# Patient Record
Sex: Female | Born: 1995 | Race: Black or African American | Hispanic: No | Marital: Single | Smoking: Former smoker
Health system: Southern US, Community
[De-identification: ages and names within clinical notes are randomized; demographics above are authoritative.]

## PROBLEM LIST (undated history)

## (undated) ENCOUNTER — Inpatient Hospital Stay: Payer: Self-pay

## (undated) DIAGNOSIS — N83209 Unspecified ovarian cyst, unspecified side: Secondary | ICD-10-CM

## (undated) DIAGNOSIS — I10 Essential (primary) hypertension: Secondary | ICD-10-CM

## (undated) DIAGNOSIS — Z98891 History of uterine scar from previous surgery: Secondary | ICD-10-CM

## (undated) HISTORY — DX: History of uterine scar from previous surgery: Z98.891

## (undated) HISTORY — PX: APPENDECTOMY: SHX54

## (undated) HISTORY — PX: WISDOM TOOTH EXTRACTION: SHX21

---
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
## 2015-02-04 ENCOUNTER — Emergency Department
Admission: EM | Admit: 2015-02-04 | Discharge: 2015-02-04 | Disposition: A | Discharge status: Home / Self Care | Payer: Medicaid Other | Attending: Emergency Medicine | Admitting: Emergency Medicine

## 2015-02-04 DIAGNOSIS — M25562 Pain in left knee: Secondary | ICD-10-CM | POA: Diagnosis not present

## 2015-02-04 DIAGNOSIS — M25561 Pain in right knee: Secondary | ICD-10-CM | POA: Insufficient documentation

## 2015-02-04 MED ORDER — NAPROXEN 500 MG PO TABS: 500.0000 mg | ORAL_TABLET | Freq: Two times a day (BID) | ORAL | Status: DC

## 2015-02-04 NOTE — ED Provider Notes (Signed)
[facility] Emergency Department Provider Note  ____________________________________________  Time seen: On arrival  I have reviewed the triage vital signs and the nursing notes.   HISTORY  Chief Complaint Knee Pain    HPI Brittany Young is a 19 y.o. female whopresents with complaints to bilateral knees for about 2 weeks. She blames this on having to stand at her job all day. He is able to ambulate without difficulty. She reports the pain is intermittent and is aching and mild in nature. No injuries  History reviewed. No pertinent past medical history.  There are no active problems to display for this patient.   History reviewed. No pertinent past surgical history.  Current Outpatient Rx  Name  Route  Sig  Dispense  Refill  . naproxen (NAPROSYN) 500 MG tablet   Oral   Take 1 tablet (500 mg total) by mouth 2 (two) times daily with a meal.   20 tablet   2     Allergies Review of patient's allergies indicates no known allergies.  No family history on file.  Social History History  Substance Use Topics  . Smoking status: Never Smoker   . Smokeless tobacco: Not on file  . Alcohol Use: Not on file   no alcohol use  Review of Systems  Constitutional: Negative for fever. Eyes: Negative for visual changes. ENT: Negative for sore throat Genitourinary: Negative for dysuria. Musculoskeletal: Negative for back pain. Positive for bilateral knee discomfort Skin: Negative for rash. Neurological: Negative for headaches    ____________________________________________   PHYSICAL EXAM:  VITAL SIGNS: ED Triage Vitals  Enc Vitals Group     BP 02/04/15 1447 122/81 mmHg     Pulse Rate 02/04/15 1447 72     Resp 02/04/15 1447 14     Temp 02/04/15 1447 98.6 F (37 C)     Temp Source 02/04/15 1447 Oral     SpO2 02/04/15 1447 99 %     Weight 02/04/15 1447 134 lb (60.782 kg)     Height 02/04/15 1447 5\' 3"  (1.6 m)     Head Cir --      Peak Flow --       Pain Score 02/04/15 1448 3     Pain Loc --      Pain Edu? --      Excl. in GC? --      Constitutional: Alert and oriented. Well appearing and in no distress.     Musculoskeletal: Nontender with normal range of motion in all extremities. No tenderness to palpation of knees. No knee effusions. No erythema. No injury. Benign knee exam. All tendons intact Neurologic:  Normal speech and language. No gross focal neurologic deficits are appreciated. Skin:  Skin is warm, dry and intact. No rash noted. Psychiatric: Mood and affect are normal. Patient exhibits appropriate insight and judgment.  ____________________________________________    LABS (pertinent positives/negatives)  Labs Reviewed - No data to display  ____________________________________________     ____________________________________________    RADIOLOGY I have personally reviewed any xrays that were ordered on this patient: None  ____________________________________________   PROCEDURES  Procedure(s) performed: none   ____________________________________________   INITIAL IMPRESSION / ASSESSMENT AND PLAN / ED COURSE  Pertinent labs & imaging results that were available during my care of the patient were reviewed by me and considered in my medical decision making (see chart for details).  Benign knee exam. I'll prescribe NSAIDs and have her follow-up with him as needed.  ____________________________________________  FINAL CLINICAL IMPRESSION(S) / ED DIAGNOSES  Final diagnoses:  Knee pain, bilateral     Jene Every, MD 02/04/15 [phone]

## 2015-02-04 NOTE — Discharge Instructions (Signed)

## 2015-02-04 NOTE — ED Notes (Signed)
Pt reports to ED w/ c/o of pain in both knees.

## 2015-02-04 NOTE — ED Notes (Signed)
States pain to bilateral knees off and on for last 2 weeks. States she stands a lot for her job.

## 2015-07-22 ENCOUNTER — Encounter: Payer: Self-pay | Admitting: Emergency Medicine

## 2015-07-22 ENCOUNTER — Observation Stay
Admission: EM | Admit: 2015-07-22 | Discharge: 2015-07-24 | Disposition: A | Discharge status: Home / Self Care | Payer: Medicaid Other | Attending: General Surgery | Admitting: General Surgery

## 2015-07-22 DIAGNOSIS — Z79899 Other long term (current) drug therapy: Secondary | ICD-10-CM | POA: Diagnosis not present

## 2015-07-22 DIAGNOSIS — E876 Hypokalemia: Secondary | ICD-10-CM | POA: Diagnosis not present

## 2015-07-22 DIAGNOSIS — K353 Acute appendicitis with localized peritonitis, without perforation or gangrene: Secondary | ICD-10-CM

## 2015-07-22 DIAGNOSIS — R1031 Right lower quadrant pain: Secondary | ICD-10-CM | POA: Insufficient documentation

## 2015-07-22 DIAGNOSIS — R112 Nausea with vomiting, unspecified: Secondary | ICD-10-CM | POA: Diagnosis not present

## 2015-07-22 DIAGNOSIS — A749 Chlamydial infection, unspecified: Secondary | ICD-10-CM | POA: Diagnosis not present

## 2015-07-22 DIAGNOSIS — K2970 Gastritis, unspecified, without bleeding: Secondary | ICD-10-CM | POA: Insufficient documentation

## 2015-07-22 DIAGNOSIS — K37 Unspecified appendicitis: Secondary | ICD-10-CM | POA: Diagnosis present

## 2015-07-22 HISTORY — DX: Unspecified ovarian cyst, unspecified side: N83.209

## 2015-07-22 LAB — CBC
HEMATOCRIT: 36.1 % (ref 35.0–47.0)
HEMOGLOBIN: 11.8 g/dL — AB (ref 12.0–16.0)
MCH: 26.8 pg (ref 26.0–34.0)
MCHC: 32.5 g/dL (ref 32.0–36.0)
MCV: 82.4 fL (ref 80.0–100.0)
Platelets: 228 10*3/uL (ref 150–440)
RBC: 4.39 MIL/uL (ref 3.80–5.20)
RDW: 13 % (ref 11.5–14.5)
WBC: 13.2 10*3/uL — ABNORMAL HIGH (ref 3.6–11.0)

## 2015-07-22 LAB — COMPREHENSIVE METABOLIC PANEL
ALK PHOS: 61 U/L (ref 38–126)
ALT: 11 U/L — AB (ref 14–54)
AST: 16 U/L (ref 15–41)
Albumin: 4.7 g/dL (ref 3.5–5.0)
Anion gap: 10 (ref 5–15)
BILIRUBIN TOTAL: 1.2 mg/dL (ref 0.3–1.2)
BUN: 9 mg/dL (ref 6–20)
CHLORIDE: 106 mmol/L (ref 101–111)
CO2: 23 mmol/L (ref 22–32)
Calcium: 9.6 mg/dL (ref 8.9–10.3)
Creatinine, Ser: 0.63 mg/dL (ref 0.44–1.00)
GFR calc Af Amer: >60 mL/min (ref >60)
Glucose, Bld: 117 mg/dL — ABNORMAL HIGH (ref 65–99)
Potassium: 2.8 mmol/L — CL (ref 3.5–5.1)
Sodium: 139 mmol/L (ref 135–145)
TOTAL PROTEIN: 8.4 g/dL — AB (ref 6.5–8.1)

## 2015-07-22 LAB — LIPASE, BLOOD: LIPASE: 19 U/L (ref 11–51)

## 2015-07-22 NOTE — ED Notes (Signed)
Pt to triage via w/c, tearful; reports N/V and abd pain "all over" tonight while at work

## 2015-07-22 NOTE — ED Notes (Signed)
Pt presents to ED with c/o nausea, vomiting and abdominal pain since about 1900-2000 tonight while at work. Pt reports sharp pain all over stomach, denies diarrhea or blood in emesis. Pt reports noticed vaginal blood spotting that began tonight. Reports LMP was last week. Pt denies chest pain, dizziness, or fevers. Pt alert and oriented x 4, skin warm and dry, no increased work in breathing. NAD noted at this time.

## 2015-07-22 NOTE — ED Notes (Signed)
Pt reports unable to provide urine specimen at this time. Informed pt to notify this RN when able to provide specimen.

## 2015-07-22 NOTE — ED Notes (Signed)
Pt unable to urinate. Given specimen cup.

## 2015-07-23 ENCOUNTER — Observation Stay: Payer: Medicaid Other | Admitting: Anesthesiology

## 2015-07-23 ENCOUNTER — Encounter
Admission: EM | Disposition: A | Discharge status: Home / Self Care | Payer: Self-pay | Source: Home / Self Care | Attending: Emergency Medicine

## 2015-07-23 ENCOUNTER — Emergency Department: Payer: Medicaid Other

## 2015-07-23 DIAGNOSIS — K353 Acute appendicitis with localized peritonitis, without perforation or gangrene: Secondary | ICD-10-CM | POA: Insufficient documentation

## 2015-07-23 DIAGNOSIS — K37 Unspecified appendicitis: Secondary | ICD-10-CM | POA: Diagnosis present

## 2015-07-23 HISTORY — PX: LAPAROSCOPIC APPENDECTOMY: SHX408

## 2015-07-23 LAB — URINALYSIS COMPLETE WITH MICROSCOPIC (ARMC ONLY)
Bilirubin Urine: NEGATIVE
GLUCOSE, UA: 50 mg/dL — AB
Leukocytes, UA: NEGATIVE
NITRITE: POSITIVE — AB
Protein, ur: 100 mg/dL — AB
Specific Gravity, Urine: 1.024 (ref 1.005–1.030)
pH: 7 (ref 5.0–8.0)

## 2015-07-23 LAB — BASIC METABOLIC PANEL
ANION GAP: 6 (ref 5–15)
BUN: 6 mg/dL (ref 6–20)
CALCIUM: 8.8 mg/dL — AB (ref 8.9–10.3)
CHLORIDE: 106 mmol/L (ref 101–111)
CO2: 24 mmol/L (ref 22–32)
Creatinine, Ser: 0.54 mg/dL (ref 0.44–1.00)
GFR calc Af Amer: >60 mL/min (ref >60)
GFR calc non Af Amer: >60 mL/min (ref >60)
GLUCOSE: 131 mg/dL — AB (ref 65–99)
Potassium: 3.4 mmol/L — ABNORMAL LOW (ref 3.5–5.1)
Sodium: 136 mmol/L (ref 135–145)

## 2015-07-23 LAB — CBC
HEMATOCRIT: 33.7 % — AB (ref 35.0–47.0)
HEMOGLOBIN: 11 g/dL — AB (ref 12.0–16.0)
MCH: 26.7 pg (ref 26.0–34.0)
MCHC: 32.8 g/dL (ref 32.0–36.0)
MCV: 81.5 fL (ref 80.0–100.0)
Platelets: 196 10*3/uL (ref 150–440)
RBC: 4.13 MIL/uL (ref 3.80–5.20)
RDW: 13.1 % (ref 11.5–14.5)
WBC: 16.5 10*3/uL — ABNORMAL HIGH (ref 3.6–11.0)

## 2015-07-23 LAB — WET PREP, GENITAL
Sperm: NONE SEEN
Trich, Wet Prep: NONE SEEN
YEAST WET PREP: NONE SEEN

## 2015-07-23 LAB — POCT PREGNANCY, URINE: Preg Test, Ur: NEGATIVE

## 2015-07-23 LAB — HCG, QUANTITATIVE, PREGNANCY

## 2015-07-23 LAB — CHLAMYDIA/NGC RT PCR (ARMC ONLY)
Chlamydia Tr: DETECTED — AB
N gonorrhoeae: NOT DETECTED

## 2015-07-23 SURGERY — APPENDECTOMY, LAPAROSCOPIC
Anesthesia: General

## 2015-07-23 MED ORDER — DIPHENHYDRAMINE HCL 50 MG/ML IJ SOLN: 25.0000 mg | Freq: Four times a day (QID) | INTRAMUSCULAR | Status: DC | PRN

## 2015-07-23 MED ORDER — ROCURONIUM BROMIDE 100 MG/10ML IV SOLN
INTRAVENOUS | Status: DC | PRN
  Administered 2015-07-23: 30 mg via INTRAVENOUS

## 2015-07-23 MED ORDER — BUPIVACAINE-EPINEPHRINE (PF) 0.25% -1:200000 IJ SOLN
INTRAMUSCULAR | Status: DC | PRN
  Administered 2015-07-23: 20 mL via PERINEURAL

## 2015-07-23 MED ORDER — ONDANSETRON HCL 4 MG/2ML IJ SOLN
4.0000 mg | Freq: Once | INTRAMUSCULAR | Status: AC
  Administered 2015-07-23: 4 mg via INTRAVENOUS
  Filled 2015-07-23: qty 2

## 2015-07-23 MED ORDER — POTASSIUM CHLORIDE 10 MEQ/100ML IV SOLN
10.0000 meq | INTRAVENOUS | Status: AC
  Administered 2015-07-23 (×3): 10 meq via INTRAVENOUS
  Filled 2015-07-23 (×3): qty 100

## 2015-07-23 MED ORDER — NALOXONE HCL 0.4 MG/ML IJ SOLN
INTRAMUSCULAR | Status: DC | PRN
  Administered 2015-07-23: 40 ug via INTRAVENOUS

## 2015-07-23 MED ORDER — DEXAMETHASONE SODIUM PHOSPHATE 10 MG/ML IJ SOLN
INTRAMUSCULAR | Status: DC | PRN
Start: 2015-07-23 — End: 2015-07-23
  Administered 2015-07-23: 8 mg via INTRAVENOUS

## 2015-07-23 MED ORDER — MORPHINE SULFATE (PF) 4 MG/ML IV SOLN
4.0000 mg | Freq: Once | INTRAVENOUS | Status: AC
  Administered 2015-07-23: 4 mg via INTRAVENOUS
  Filled 2015-07-23: qty 1

## 2015-07-23 MED ORDER — NEOSTIGMINE METHYLSULFATE 10 MG/10ML IV SOLN
INTRAVENOUS | Status: DC | PRN
  Administered 2015-07-23: 3 mg via INTRAVENOUS

## 2015-07-23 MED ORDER — ONDANSETRON HCL 4 MG/2ML IJ SOLN: 4.0000 mg | Freq: Four times a day (QID) | INTRAMUSCULAR | Status: DC | PRN

## 2015-07-23 MED ORDER — LACTATED RINGERS IV SOLN
INTRAVENOUS | Status: DC | PRN
  Administered 2015-07-23: 11:00:00 via INTRAVENOUS

## 2015-07-23 MED ORDER — IOHEXOL 240 MG/ML SOLN
25.0000 mL | Freq: Once | INTRAMUSCULAR | Status: AC | PRN
  Administered 2015-07-23: 25 mL via ORAL

## 2015-07-23 MED ORDER — DOXYCYCLINE HYCLATE 100 MG PO TABS
100.0000 mg | ORAL_TABLET | Freq: Two times a day (BID) | ORAL | Status: DC
  Administered 2015-07-24: 100 mg via ORAL
  Filled 2015-07-23: qty 1

## 2015-07-23 MED ORDER — ONDANSETRON HCL 4 MG/2ML IJ SOLN
4.0000 mg | Freq: Once | INTRAMUSCULAR | Status: AC
  Administered 2015-07-23: 4 mg via INTRAVENOUS

## 2015-07-23 MED ORDER — BUPIVACAINE-EPINEPHRINE (PF) 0.25% -1:200000 IJ SOLN
INTRAMUSCULAR | Status: AC
  Filled 2015-07-23: qty 30

## 2015-07-23 MED ORDER — GLYCOPYRROLATE 0.2 MG/ML IJ SOLN
INTRAMUSCULAR | Status: DC | PRN
Start: 2015-07-23 — End: 2015-07-23
  Administered 2015-07-23: 0.4 mg via INTRAVENOUS

## 2015-07-23 MED ORDER — LIDOCAINE HCL (CARDIAC) 20 MG/ML IV SOLN
INTRAVENOUS | Status: DC | PRN
  Administered 2015-07-23: 40 mg via INTRAVENOUS

## 2015-07-23 MED ORDER — PROMETHAZINE HCL 25 MG/ML IJ SOLN: 6.2500 mg | INTRAMUSCULAR | Status: DC | PRN

## 2015-07-23 MED ORDER — METRONIDAZOLE IN NACL 5-0.79 MG/ML-% IV SOLN
500.0000 mg | Freq: Three times a day (TID) | INTRAVENOUS | Status: DC
  Administered 2015-07-23 – 2015-07-24 (×4): 500 mg via INTRAVENOUS
  Filled 2015-07-23 (×6): qty 100

## 2015-07-23 MED ORDER — MIDAZOLAM HCL 2 MG/2ML IJ SOLN
INTRAMUSCULAR | Status: DC | PRN
  Administered 2015-07-23: 2 mg via INTRAVENOUS

## 2015-07-23 MED ORDER — ONDANSETRON HCL 4 MG/2ML IJ SOLN
INTRAMUSCULAR | Status: AC
Start: 2015-07-23 — End: 2015-07-23
  Filled 2015-07-23: qty 2

## 2015-07-23 MED ORDER — PROPOFOL 10 MG/ML IV BOLUS
INTRAVENOUS | Status: DC | PRN
  Administered 2015-07-23: 120 mg via INTRAVENOUS

## 2015-07-23 MED ORDER — OXYCODONE-ACETAMINOPHEN 5-325 MG PO TABS
1.0000 | ORAL_TABLET | ORAL | Status: DC | PRN
Start: 2015-07-23 — End: 2015-07-24
  Administered 2015-07-23: 1 via ORAL
  Filled 2015-07-23 (×2): qty 1

## 2015-07-23 MED ORDER — IOHEXOL 300 MG/ML  SOLN
100.0000 mL | Freq: Once | INTRAMUSCULAR | Status: AC | PRN
  Administered 2015-07-23: 100 mL via INTRAVENOUS

## 2015-07-23 MED ORDER — DEXTROSE 5 % IV SOLN
2.0000 g | INTRAVENOUS | Status: DC
  Administered 2015-07-23 – 2015-07-24 (×2): 2 g via INTRAVENOUS
  Filled 2015-07-23 (×2): qty 2

## 2015-07-23 MED ORDER — ONDANSETRON 4 MG PO TBDP: 4.0000 mg | ORAL_TABLET | Freq: Four times a day (QID) | ORAL | Status: DC | PRN

## 2015-07-23 MED ORDER — PREDNISONE 20 MG PO TABS: 60.0000 mg | ORAL_TABLET | Freq: Once | ORAL | Status: DC

## 2015-07-23 MED ORDER — FENTANYL CITRATE (PF) 100 MCG/2ML IJ SOLN
INTRAMUSCULAR | Status: DC | PRN
  Administered 2015-07-23: 100 ug via INTRAVENOUS
  Administered 2015-07-23: 50 ug via INTRAVENOUS

## 2015-07-23 MED ORDER — KCL IN DEXTROSE-NACL 20-5-0.45 MEQ/L-%-% IV SOLN
INTRAVENOUS | Status: DC
  Administered 2015-07-23 – 2015-07-24 (×4): via INTRAVENOUS
  Filled 2015-07-23 (×6): qty 1000

## 2015-07-23 MED ORDER — PHENYLEPHRINE HCL 10 MG/ML IJ SOLN
INTRAMUSCULAR | Status: DC | PRN
  Administered 2015-07-23: 100 ug via INTRAVENOUS

## 2015-07-23 MED ORDER — FENTANYL CITRATE (PF) 100 MCG/2ML IJ SOLN
25.0000 ug | INTRAMUSCULAR | Status: DC | PRN
  Administered 2015-07-23 (×2): 50 ug via INTRAVENOUS

## 2015-07-23 MED ORDER — ONDANSETRON HCL 4 MG/2ML IJ SOLN
INTRAMUSCULAR | Status: DC | PRN
  Administered 2015-07-23: 4 mg via INTRAVENOUS

## 2015-07-23 MED ORDER — DIPHENHYDRAMINE HCL 25 MG PO CAPS: 25.0000 mg | ORAL_CAPSULE | Freq: Four times a day (QID) | ORAL | Status: DC | PRN

## 2015-07-23 MED ORDER — MORPHINE SULFATE (PF) 2 MG/ML IV SOLN
2.0000 mg | INTRAVENOUS | Status: DC | PRN
  Administered 2015-07-23: 2 mg via INTRAVENOUS
  Filled 2015-07-23: qty 1

## 2015-07-23 MED ORDER — FENTANYL CITRATE (PF) 100 MCG/2ML IJ SOLN
INTRAMUSCULAR | Status: AC
  Administered 2015-07-23: 50 ug via INTRAVENOUS
  Filled 2015-07-23: qty 2

## 2015-07-23 MED ORDER — SODIUM CHLORIDE 0.9 % IV BOLUS (SEPSIS)
1000.0000 mL | Freq: Once | INTRAVENOUS | Status: AC
  Administered 2015-07-23: 1000 mL via INTRAVENOUS

## 2015-07-23 SURGICAL SUPPLY — 42 items
ADHESIVE MASTISOL STRL (MISCELLANEOUS) ×3 IMPLANT
APPLIER CLIP ROT 10 11.4 M/L (STAPLE) ×3
BLADE SURG SZ11 CARB STEEL (BLADE) ×3 IMPLANT
CANISTER SUCT 3000ML (MISCELLANEOUS) ×3 IMPLANT
CATH TRAY 16F METER LATEX (MISCELLANEOUS) ×3 IMPLANT
CHLORAPREP W/TINT 26ML (MISCELLANEOUS) ×3 IMPLANT
CLIP APPLIE ROT 10 11.4 M/L (STAPLE) ×1 IMPLANT
CLOSURE WOUND 1/2 X4 (GAUZE/BANDAGES/DRESSINGS) ×1
CUTTER FLEX LINEAR 45M (STAPLE) ×3 IMPLANT
DEVICE TROCAR PUNCTURE CLOSURE (ENDOMECHANICALS) ×3 IMPLANT
ELECT REM PT RETURN 9FT ADLT (ELECTROSURGICAL)
ELECTRODE REM PT RTRN 9FT ADLT (ELECTROSURGICAL) IMPLANT
ENDOPOUCH RETRIEVER 10 (MISCELLANEOUS) ×3 IMPLANT
GAUZE SPONGE NON-WVN 2X2 STRL (MISCELLANEOUS) ×3 IMPLANT
GLOVE BIO SURGEON STRL SZ8 (GLOVE) ×9 IMPLANT
GOWN STRL REUS W/ TWL LRG LVL3 (GOWN DISPOSABLE) ×2 IMPLANT
GOWN STRL REUS W/TWL LRG LVL3 (GOWN DISPOSABLE) ×4
IRRIGATION STRYKERFLOW (MISCELLANEOUS) ×1 IMPLANT
IRRIGATOR STRYKERFLOW (MISCELLANEOUS) ×3
KIT RM TURNOVER STRD PROC AR (KITS) ×3 IMPLANT
LABEL OR SOLS (LABEL) ×3 IMPLANT
NDL SAFETY 22GX1.5 (NEEDLE) ×3 IMPLANT
NEEDLE VERESS 14GA 120MM (NEEDLE) ×3 IMPLANT
NS IRRIG 500ML POUR BTL (IV SOLUTION) ×3 IMPLANT
PACK LAP CHOLECYSTECTOMY (MISCELLANEOUS) ×3 IMPLANT
RELOAD 45 VASCULAR/THIN (ENDOMECHANICALS) ×6 IMPLANT
RELOAD STAPLE TA45 3.5 REG BLU (ENDOMECHANICALS) ×3 IMPLANT
SCISSORS METZENBAUM CVD 33 (INSTRUMENTS) ×3 IMPLANT
SLEEVE ENDOPATH XCEL 5M (ENDOMECHANICALS) ×3 IMPLANT
SOL .9 NS 3000ML IRR  AL (IV SOLUTION) ×2
SOL .9 NS 3000ML IRR UROMATIC (IV SOLUTION) ×1 IMPLANT
SPONGE LAP 18X18 5 PK (GAUZE/BANDAGES/DRESSINGS) ×3 IMPLANT
SPONGE VERSALON 2X2 STRL (MISCELLANEOUS) ×6
STRIP CLOSURE SKIN 1/2X4 (GAUZE/BANDAGES/DRESSINGS) ×2 IMPLANT
SUT MNCRL 4-0 (SUTURE) ×2
SUT MNCRL 4-0 27XMFL (SUTURE) ×1
SUT VICRYL 0 TIES 12 18 (SUTURE) ×3 IMPLANT
SUTURE MNCRL 4-0 27XMF (SUTURE) ×1 IMPLANT
TRAP SPECIMEN MUCOUS 40CC (MISCELLANEOUS) IMPLANT
TROCAR XCEL 12X100 BLDLESS (ENDOMECHANICALS) ×3 IMPLANT
TROCAR XCEL NON-BLD 5MMX100MML (ENDOMECHANICALS) ×3 IMPLANT
TUBING INSUFFLATOR HI FLOW (MISCELLANEOUS) ×3 IMPLANT

## 2015-07-23 NOTE — Anesthesia Procedure Notes (Signed)
Procedure Name: Intubation Date/Time: 07/23/2015 11:05 AM Performed by: Henrietta Hoover Pre-anesthesia Checklist: Patient identified, Emergency Drugs available, Suction available, Patient being monitored and Timeout performed Patient Re-evaluated:Patient Re-evaluated prior to inductionOxygen Delivery Method: Circle system utilized Preoxygenation: Pre-oxygenation with 100% oxygen Intubation Type: IV induction and Cricoid Pressure applied Laryngoscope Size: Mac and 3 Grade View: Grade I Tube type: Oral Tube size: 7.0 mm Number of attempts: 1 Airway Equipment and Method: Stylet Placement Confirmation: ETT inserted through vocal cords under direct vision,  positive ETCO2 and breath sounds checked- equal and bilateral Secured at: 21 cm Dental Injury: Teeth and Oropharynx as per pre-operative assessment

## 2015-07-23 NOTE — Anesthesia Preprocedure Evaluation (Signed)
Anesthesia Evaluation  Patient identified by MRN, date of birth, ID band Patient awake    Reviewed: Allergy & Precautions, H&P , NPO status , Patient's Chart, lab work & pertinent test results, reviewed documented beta blocker date and time   History of Anesthesia Complications Negative for: history of anesthetic complications  Airway Mallampati: II  TM Distance: >3 FB Neck ROM: full    Dental no notable dental hx. (+) Teeth Intact   Pulmonary neg pulmonary ROS,    Pulmonary exam normal breath sounds clear to auscultation       Cardiovascular Exercise Tolerance: Good negative cardio ROS Normal cardiovascular exam Rhythm:regular Rate:Normal     Neuro/Psych negative neurological ROS  negative psych ROS   GI/Hepatic negative GI ROS, Neg liver ROS,   Endo/Other  negative endocrine ROS  Renal/GU negative Renal ROS  negative genitourinary   Musculoskeletal   Abdominal   Peds  Hematology negative hematology ROS (+)   Anesthesia Other Findings Past Medical History:   Ovarian cyst                                                 Reproductive/Obstetrics negative OB ROS                             Anesthesia Physical Anesthesia Plan  ASA: I  Anesthesia Plan: General   Post-op Pain Management:    Induction:   Airway Management Planned:   Additional Equipment:   Intra-op Plan:   Post-operative Plan:   Informed Consent: I have reviewed the patients History and Physical, chart, labs and discussed the procedure including the risks, benefits and alternatives for the proposed anesthesia with the patient or authorized representative who has indicated his/her understanding and acceptance.   Dental Advisory Given  Plan Discussed with: Anesthesiologist, CRNA and Surgeon  Anesthesia Plan Comments:         Anesthesia Quick Evaluation

## 2015-07-23 NOTE — Transfer of Care (Signed)
Immediate Anesthesia Transfer of Care Note  Patient: Brittany Young  Procedure(s) Performed: Procedure(s): APPENDECTOMY LAPAROSCOPIC (N/A)  Patient Location: PACU  Anesthesia Type:General  Level of Consciousness: sedated  Airway & Oxygen Therapy: Patient Spontanous Breathing and Patient connected to face mask oxygen  Post-op Assessment: Report given to RN and Post -op Vital signs reviewed and stable  Post vital signs: Reviewed and stable  Last Vitals:  Filed Vitals:   07/23/15 1043 07/23/15 1155  BP: 126/84   Pulse: 90   Temp: 38.1 C 36.6 C  Resp: 20     Complications: No apparent anesthesia complications

## 2015-07-23 NOTE — ED Provider Notes (Signed)
[facility] Emergency Department Provider Note  ____________________________________________  Time seen: Approximately 0004 AM  I have reviewed the triage vital signs and the nursing notes.   HISTORY  Chief Complaint Emesis and Abdominal Pain    HPI Brittany Young is a 20 y.o. female who comes into the hospital today with abdominal pain and vomiting. She reports thatthe symptoms started today around 7 PM. The patient was at work and had just eaten some Doritos on her break. The patient denies any diarrhea. She reports that the emesis looks like food but is now pink clear. She's had 4-5 episodes of emesis with no fevers and no sick contacts. The patient rates her pain a 10 out of 10 in intensity and describes the pain as sharp and stabbing. The patient has been urinating well. She's had no fevers and no back pain. She reports that the pain seems to be all over her abdomen and she cannot pinpoint the pain. She has not taken anything for the pain.   Past Medical History  Diagnosis Date  . Ovarian cyst     There are no active problems to display for this patient.   History reviewed. No pertinent past surgical history.  Current Outpatient Rx  Name  Route  Sig  Dispense  Refill  . naproxen (NAPROSYN) 500 MG tablet   Oral   Take 1 tablet (500 mg total) by mouth 2 (two) times daily with a meal. Patient not taking: Reported on 07/23/2015   20 tablet   2     Allergies Review of patient's allergies indicates no known allergies.  No family history on file.  Social History Social History  Substance Use Topics  . Smoking status: Never Smoker   . Smokeless tobacco: None  . Alcohol Use: None    Review of Systems Constitutional: No fever/chills Eyes: No visual changes. ENT: No sore throat. Cardiovascular: Denies chest pain. Respiratory: Denies shortness of breath. Gastrointestinal: abdominal pain, nausea, vomiting.  No diarrhea.  No  constipation. Genitourinary: Negative for dysuria. Musculoskeletal: Negative for back pain. Skin: Negative for rash. Neurological: Negative for headaches, focal weakness or numbness.  10-point ROS otherwise negative.  ____________________________________________   PHYSICAL EXAM:  VITAL SIGNS: ED Triage Vitals  Enc Vitals Group     BP 07/22/15 2235 131/88 mmHg     Pulse Rate 07/22/15 2235 93     Resp 07/22/15 2235 22     Temp 07/22/15 2235 97.9 F (36.6 C)     Temp Source 07/22/15 2235 Oral     SpO2 07/22/15 2235 98 %     Weight 07/22/15 2235 125 lb (56.7 kg)     Height 07/22/15 2235  (1.6 m)     Head Cir --      Peak Flow --      Pain Score 07/22/15 2234 10     Pain Loc --      Pain Edu? --      Excl. in GC? --     Constitutional: Alert and oriented. Well appearing and in moderate distress. Eyes: Conjunctivae are normal. PERRL. EOMI. Head: Atraumatic. Nose: No congestion/rhinnorhea. Mouth/Throat: Mucous membranes are moist.  Oropharynx non-erythematous. Cardiovascular: Normal rate, regular rhythm. Grossly normal heart sounds.  Good peripheral circulation. Respiratory: Normal respiratory effort.  No retractions. Lungs CTAB. Gastrointestinal: Soft with right lower quadrant tenderness to palpation. No distention. Positive bowel sounds Genitourinary: Normal external genitalia, mild amount of blood in vaginal vault with no discharge, no cervical motion  tenderness but right sided adnexal tenderness to palpation. Musculoskeletal: No lower extremity tenderness nor edema.  Neurologic:  Normal speech and language. Skin:  Skin is warm, dry and intact.  Psychiatric: Mood and affect are normal.   ____________________________________________   LABS (all labs ordered are listed, but only abnormal results are displayed)  Labs Reviewed  WET PREP, GENITAL - Abnormal; Notable for the following:    Clue Cells Wet Prep HPF POC PRESENT (*)    WBC, Wet Prep HPF POC MODERATE (*)     All other components within normal limits  COMPREHENSIVE METABOLIC PANEL - Abnormal; Notable for the following:    Potassium 2.8 (*)    Glucose, Bld 117 (*)    Total Protein 8.4 (*)    ALT 11 (*)    All other components within normal limits  CBC - Abnormal; Notable for the following:    WBC 13.2 (*)    Hemoglobin 11.8 (*)    All other components within normal limits  URINALYSIS COMPLETEWITH MICROSCOPIC ([facility] ONLY) - Abnormal; Notable for the following:    Color, Urine YELLOW (*)    APPearance HAZY (*)    Glucose, UA 50 (*)    Ketones, ur 2+ (*)    Hgb urine dipstick 3+ (*)    Protein, ur 100 (*)    Nitrite POSITIVE (*)    Bacteria, UA FEW (*)    Squamous Epithelial / LPF 0-5 (*)    All other components within normal limits  CHLAMYDIA/NGC RT PCR ([facility] ONLY)  LIPASE, BLOOD  HCG, QUANTITATIVE, PREGNANCY  POC URINE PREG, ED  POCT PREGNANCY, URINE   ____________________________________________  EKG  None ____________________________________________  RADIOLOGY  CT abdomen and pelvis: Acute appendicitis, low-lying IUD recommend gynecological evaluation on a nonemergent basis, mild gastritis. ____________________________________________   PROCEDURES  Procedure(s) performed: None  Critical Care performed: No  ____________________________________________   INITIAL IMPRESSION / ASSESSMENT AND PLAN / ED COURSE  Pertinent labs & imaging results that were available during my care of the patient were reviewed by me and considered in my medical decision making (see chart for details).  This is a 20 year old female who comes into the hospital today with some abdominal pain that started suddenly while she was at work [employer]. The patient does have some tenderness in her right lower quadrant so I will send her for a CT scan. I will also check a quantitative beta hCG and do a pelvic exam. The patient reports that her last menstrual period was one week ago but I will  evaluate to ensure she does not have a possible ectopic pregnancy. The patient receive a dose of morphine as well as a liter of normal saline.  The patient did receive a second dose of morphine as she continued to have some lower abdominal pain. I did receive a call from the radiologist that the patient had acute appendicitis. I contacted the surgeon Dr. Tonita Cong and he will come to evaluate the patient. I discussed the results with the patient as well. Patient will be admitted to the surgical service. ____________________________________________   FINAL CLINICAL IMPRESSION(S) / ED DIAGNOSES  Final diagnoses:  Acute appendicitis with localized peritonitis      Rebecka Apley, MD 07/23/15 [phone]

## 2015-07-23 NOTE — Op Note (Signed)
laparascopic appendectomy   Leonard Schwartz Date of operation:  07/23/2015  Indications: The patient presented with a history of  abdominal pain. Workup has revealed findings consistent with acute appendicitis.  Pre-operative Diagnosis: Acute appendicitis  Post-operative Diagnosis: Acute suppurative appendicitis nonruptured  Surgeon: Adah Salvage. Excell Seltzer, MD, FACS  Anesthesia: General with endotracheal tube  Procedure Details  The patient was seen again in the preop area. The options of surgery versus observation were reviewed with the patient and/or family. The risks of bleeding, infection, recurrence of symptoms, negative laparoscopy, potential for an open procedure, bowel injury, abscess or infection, were all reviewed as well. The patient was taken to Operating Room, identified as Leonard Schwartz and the procedure verified as laparoscopic appendectomy. A Time Out was held and the above information confirmed.  The patient was placed in the supine position and general anesthesia was induced.  Antibiotic prophylaxis was administered and VT E prophylaxis was in place. A Foley catheter was placed by the nursing staff.   The abdomen was prepped and draped in a sterile fashion. An infraumbilical incision was made. A Veress needle was placed and pneumoperitoneum was obtained. A 5 mm trocar port was placed without difficulty and the abdominal cavity was explored.  Under direct vision a 5 mm suprapubic port was placed and a 13 mm left lateral port was placed all under direct vision.  The appendix was identified and found to be acutely inflamed and suppurative but nonruptured was also cloudy fluid in the pelvis but the tubes did not seem to be hourly inflamed.  The appendix was carefully dissected. The base of the appendix was dissected out and divided with a standard load Endo GIA. The mesoappendix was divided with a vascular load Endo GIA. 2 loads were required. The appendix was passed out through the  left lateral port site with the aid of an Endo Catch bag. The right lower quadrant and pelvis was then irrigated with copious amounts of normal saline which was aspirated. Inspection  failed to identify any additional bleeding and there were no signs of bowel injury. Therefore the left lateral port site was closed under direct vision utilizing an Endo Close technique with 0 Vicryl interrupted sutures, all under direct vision.   Again the right lower quadrant was inspected there was no sign of bleeding or bowel injury therefore pneumoperitoneum was released, all ports were removed and the skin incisions were approximated with subcuticular 4-0 Monocryl. Steri-Strips and Mastisol and sterile dressings were placed.  The patient tolerated the procedure well, there were no complications. The sponge lap and needle count were correct at the end of the procedure.  The patient was taken to the recovery room in stable condition to be admitted for continued care.  Findings: Acute appendicitis nonruptured but suppurative  Estimated Blood Loss: Nil                  Specimens: appendix         Complications:  None                  Rodriques Badie E. Excell Seltzer MD, FACS

## 2015-07-23 NOTE — ED Notes (Signed)
Pt attempting to drink contrast for CT. Zofran and Morphine given for pain and nausea prior to drinking contrast. Pt verbally acknowledged to notify this RN if pt becomes nauseous or is unable to continue to drink contrast.

## 2015-07-23 NOTE — ED Notes (Signed)
Upon this RN entry to room, pt resting with eyes closed, pt has drank about 1/2 contrast bottle, this RN encouraged pt to keep drinking contrast to have CT scan performed. Pt verbalized understanding.

## 2015-07-23 NOTE — H&P (Signed)
Patient ID: Brittany Young, female   DOB: 1996/06/05, 20 y.o.   MRN: [id number]  CC: Abdominal pain  HPI Brittany Young is a 20 y.o. female who presents emergency department with a one-day history of abdominal pain. Patient states the pain was all of her abdomen gradually moved to the right lower quadrant. She's had multiple bouts of nausea and vomiting that all started after the abdominal pain. Her last bowel movement was yesterday and it was normal. Patient states that prior to this using a usual state of health. Patient has had some subjective fevers but denies any chills, chest pain, shortness breath, diarrhea, constipation.  HPI  Past Medical History  Diagnosis Date  . Ovarian cyst     History reviewed. No pertinent past surgical history.  No family history on file.  Social History Social History  Substance Use Topics  . Smoking status: Never Smoker   . Smokeless tobacco: None  . Alcohol Use: None    No Known Allergies  No current facility-administered medications for this encounter.   Current Outpatient Prescriptions  Medication Sig Dispense Refill  . naproxen (NAPROSYN) 500 MG tablet Take 1 tablet (500 mg total) by mouth 2 (two) times daily with a meal. (Patient not taking: Reported on 07/23/2015) 20 tablet 2     Review of Systems A multi-point review of systems was asked and was negative except for the findings documented in the history of present illness  Physical Exam Blood pressure 119/82, pulse 86, temperature 97.9 F (36.6 C), temperature source Oral, resp. rate 18, height  (1.6 m), weight 56.7 kg (125 lb), last menstrual period 07/15/2015, SpO2 100 %. CONSTITUTIONAL: No acute distress. EYES: Pupils are equal, round, and reactive to light, Sclera are non-icteric. EARS, NOSE, MOUTH AND THROAT: The oropharynx is clear. The oral mucosa is pink and moist. Hearing is intact to voice. LYMPH NODES:  Lymph nodes in the neck are normal. RESPIRATORY:  Lungs are  clear. There is normal respiratory effort, with equal breath sounds bilaterally, and without pathologic use of accessory muscles. CARDIOVASCULAR: Heart is regular without murmurs, gallops, or rubs. GI: The abdomen is thin, soft, tender to palpation in the right lower quadrant with a positive McBurney's and negative Rovsing sign, and nondistended. There are no palpable masses. There is no hepatosplenomegaly. There are normal bowel sounds in all quadrants. GU: Rectal deferred.   MUSCULOSKELETAL: Normal muscle strength and tone. No cyanosis or edema.   SKIN: Turgor is good and there are no pathologic skin lesions or ulcers. NEUROLOGIC: Motor and sensation is grossly normal. Cranial nerves are grossly intact. PSYCH:  Oriented to person, place and time. Affect is normal.  Data Reviewed I reviewed the patient's labs and images. Images show a dilated appendix with some periappendiceal inflammation as well as an appendicolith consistent with appendicitis. Labs showed leukocytosis of 13.8 and being severely hypokalemic at 2.8 I have personally reviewed the patient's imaging, laboratory findings and medical records.    Assessment    Acute appendicitis    Plan    20 year old female with acute appendicitis. Discussed with patient the treatment for this is removal of her appendix. The procedure was described in detail patient voiced understanding. However, given her profound hypokalemia patient will require resuscitation and calcium repletion prior to surgery. Patient was brought in under observation for IV fluids, IV antibiotics, followed by surgery. Patient will be consented and posted for surgery with Dr. Excell Seltzer later in the day.     Time spent with the  patient was 12 minutes, with more than 50% of the time spent in face-to-face education, counseling and care coordination.     Ricarda Frame, MD FACS General Surgeon 07/23/2015, 4:35 AM

## 2015-07-23 NOTE — Progress Notes (Signed)
Initial Nutrition Assessment     INTERVENTION:  Meals and snacks: Cater to pt preferences, await diet progression    NUTRITION DIAGNOSIS:   Inadequate oral intake related to altered GI function, acute illness as evidenced by percent weight loss, per patient/family report.    GOAL:   Patient will meet greater than or equal to 90% of their needs    MONITOR:    (Energy intake, Digestive system)  REASON FOR ASSESSMENT:   Malnutrition Screening Tool    ASSESSMENT:      Pt s/p appendectomy  Past Medical History  Diagnosis Date  . Ovarian cyst     Current Nutrition: tolerating liquids Reports decreased intake for the past month    Gastrointestinal Profile: Last BM: PTA   Scheduled Medications:  . cefTRIAXone (ROCEPHIN)  IV  2 g Intravenous Q24H   And  . metronidazole  500 mg Intravenous Q8H  . doxycycline  100 mg Oral Q12H    Continuous Medications:  . dextrose 5 % and 0.45 % NaCl with KCl 20 mEq/L 125 mL/hr at 07/23/15 1421     Electrolyte/Renal Profile and Glucose Profile:   Recent Labs Lab 07/22/15 2237 07/23/15 0731  NA 139 136  K 2.8* 3.4*  CL 106 106  CO2 23 24  BUN 9 6  CREATININE 0.63 0.54  CALCIUM 9.6 8.8*  GLUCOSE 117* 131*   Protein Profile:  Recent Labs Lab 07/22/15 2237  ALBUMIN 4.7     Nutrition-Focused Physical Exam Findings: Nutrition-Focused physical exam completed. Findings are WDL for fat depletion, muscle depletion, and edema.     Weight Trend since Admission: Filed Weights   07/22/15 2235 07/23/15 0624  Weight: 125 lb (56.7 kg) 117 lb 14.4 oz (53.479 kg)    Wt per wt encounters 12% wt loss in the last 5 months Wt Readings from Last 10 Encounters:  07/23/15 117 lb 14.4 oz (53.479 kg) (30 %*, Z = -0.52)  02/04/15 134 lb (60.782 kg) (63 %*, Z = 0.32)   * Growth percentiles are based on CDC 2-20 Years data.   Pt attributes wt loss to new birth control pill that she has started.     Diet Order:  Diet  clear liquid Room service appropriate?: Yes; Fluid consistency:: Thin  Skin:   reviewed     Height:   Ht Readings from Last 1 Encounters:  07/23/15  (1.6 m) (31 %*, Z = -0.51)   * Growth percentiles are based on CDC 2-20 Years data.    Weight:   Wt Readings from Last 1 Encounters:  07/23/15 117 lb 14.4 oz (53.479 kg) (30 %*, Z = -0.52)   * Growth percentiles are based on CDC 2-20 Years data.    Ideal Body Weight:     BMI:  Body mass index is 20.89 kg/(m^2).  Estimated Nutritional Needs:   Kcal:  BEE 1274 kcals (IF 1.0-1.3, AF 1.3) 161-0960 kcals/d  Protein:  (1.0 g/kg) 53 g/d  Fluid:  (25-58ml/kg0 1325-1562ml/d  EDUCATION NEEDS:   No education needs identified at this time  MODERATE Care Level  Elanor Cale B. Freida Busman, RD, LDN [phone] (pager) Weekend/On-Call pager [phone])

## 2015-07-23 NOTE — Progress Notes (Signed)
Patient examined history reviewed both with the chart with Dr. Tonita Cong and with the patient and family.  Patient is very tender in the right lower quadrant with peritoneal signs. Nontender calves  Workups shows appendicitis.  Recommend laparoscopic appendectomy. The procedure was described for her family the options of observation reviewed the risks of bleeding infection recurrence of disease negative laparoscopy and the need for continued antibiotics etc. were all reviewed with her she understood and agreed to proceed

## 2015-07-24 LAB — SURGICAL PATHOLOGY

## 2015-07-24 MED ORDER — OXYCODONE-ACETAMINOPHEN 5-325 MG PO TABS: 1.0000 | ORAL_TABLET | ORAL | Status: DC | PRN

## 2015-07-24 MED ORDER — DOXYCYCLINE HYCLATE 100 MG PO TABS: 100.0000 mg | ORAL_TABLET | Freq: Two times a day (BID) | ORAL | Status: DC

## 2015-07-24 NOTE — Progress Notes (Signed)
Pt discharged via MD order. Small amount of dry drainage to left lower incision, others clean, dry, and intact.  IV removed per protocol. Discharge paperwork reviewed w/ pt. Pt verbalized understanding. Pt escorted by volunteer via wheelchair.

## 2015-07-24 NOTE — Discharge Instructions (Signed)
Remove dressing in 24 hours. °May shower in 24 hours. °Leave paper strips in place. °Resume all home medications. °Follow-up with Dr. Arrie Borrelli in 10 days. °

## 2015-07-24 NOTE — Progress Notes (Signed)
1 Day Post-Op  Subjective: Patient feels well tolerating a diet her right lower quadrant pain is gone.  Objective: Vital signs in last 24 hours: Temp:  [97.9 F (36.6 C)-100.6 F (38.1 C)] 98 F (36.7 C) (01/19 0507) Pulse Rate:  [75-96] 75 (01/19 0507) Resp:  [14-25] 18 (01/19 0507) BP: (98-127)/(55-84) 98/58 mmHg (01/19 0507) SpO2:  [97 %-100 %] 99 % (01/19 0507) Last BM Date: 07/22/15  Intake/Output from previous day: 01/18 0701 - 01/19 0700 In: 3740.3 [P.O.:725; I.V.:3015.3] Out: 2320 [Urine:2300; Blood:20] Intake/Output this shift:    Physical exam:  Wound stressed soft nontender abdomen drainage  Lab Results: CBC   Recent Labs  07/22/15 2237 07/23/15 0731  WBC 13.2* 16.5*  HGB 11.8* 11.0*  HCT 36.1 33.7*  PLT 228 196   BMET  Recent Labs  07/22/15 2237 07/23/15 0731  NA 139 136  K 2.8* 3.4*  CL 106 106  CO2 23 24  GLUCOSE 117* 131*  BUN 9 6  CREATININE 0.63 0.54  CALCIUM 9.6 8.8*   PT/INR No results for input(s): LABPROT, INR in the last 72 hours. ABG No results for input(s): PHART, HCO3 in the last 72 hours.  Invalid input(s): PCO2, PO2  Studies/Results: Ct Abdomen Pelvis W Contrast  07/23/2015  CLINICAL DATA:  Nausea, vomiting abdominal pain beginning last night while at work. Sharp stomach pain. Vaginal spotting. History of ovarian cyst. EXAM: CT ABDOMEN AND PELVIS WITH CONTRAST TECHNIQUE: Multidetector CT imaging of the abdomen and pelvis was performed using the standard protocol following bolus administration of intravenous contrast. CONTRAST:  OMNIPAQUE IOHEXOL 300 MG/ML  SOLN COMPARISON:  Pelvic ultrasound June 11, 2014 FINDINGS: LUNG BASES: Included view of the lung bases are clear. Visualized heart and pericardium are unremarkable. SOLID ORGANS: The liver demonstrates mild periportal edema. Spleen, gallbladder, pancreas and adrenal glands are unremarkable. GASTROINTESTINAL TRACT: The stomach demonstrates mild gastric wall  thickening and edema. Small and large bowel are normal in course and caliber without inflammatory changes. The appendix is 13 mm in transaxial dimension with periappendiceal inflammation, multiple subcentimeter appendicolith. KIDNEYS/ URINARY TRACT: Kidneys are orthotopic, demonstrating symmetric enhancement. No nephrolithiasis, hydronephrosis or solid renal masses. The unopacified ureters are normal in course and caliber. Urinary bladder is partially distended and unremarkable. PERITONEUM/RETROPERITONEUM: Aortoiliac vessels are normal in course and caliber. No lymphadenopathy by CT size criteria. Low-lying IUD extending to the cervix. Small amount of free fluid in the pelvis is likely reactive. No intraperitoneal free air. SOFT TISSUE/OSSEOUS STRUCTURES: Non-suspicious. IMPRESSION: Acute appendicitis. Low-lying IUD, recommend gynecological evaluation on a nonemergent basis. Mild gastritis. Acute findings discussed with and reconfirmed by Dr.ALLISON WEBSTER on 07/23/2015 at 4:02 am. Electronically Signed   By: Awilda Metro M.D.   On: 07/23/2015 04:03    Anti-infectives: Anti-infectives    Start     Dose/Rate Route Frequency Ordered Stop   07/24/15 0000  doxycycline (VIBRA-TABS) 100 MG tablet     100 mg Oral 2 times daily 07/24/15 0746     07/23/15 1800  doxycycline (VIBRA-TABS) tablet 100 mg     100 mg Oral Every 12 hours 07/23/15 1354     07/23/15 0700  cefTRIAXone (ROCEPHIN) 2 g in dextrose 5 % 50 mL IVPB     2 g 100 mL/hr over 30 Minutes Intravenous Every 24 hours 07/23/15 0625     07/23/15 0700  metroNIDAZOLE (FLAGYL) IVPB 500 mg     500 mg 100 mL/hr over 60 Minutes Intravenous Every 8 hours 07/23/15 0625  Assessment/Plan: s/p Procedure(s): APPENDECTOMY LAPAROSCOPIC   Patient doing very well recommend discharge today to follow-up in my office in 10 days she'll be sent home on doxycycline for chlamydia positive test and oral analgesics.  Lattie Haw, MD,  FACS  07/24/2015

## 2015-07-24 NOTE — Discharge Summary (Signed)
Physician Discharge Summary  Patient ID: Brittany Young MRN: [id number] DOB/AGE: 24-May-1996 20 y.o.  Admit date: 07/22/2015 Discharge date: 07/24/2015   Discharge Diagnoses:  Active Problems:   Appendicitis  chlamydia positive  Procedures lap or scopic appendectomy  Hospital Course: This a patient admitted the hospital with signs of acute appendicitis she also had a positive chlamydia test in the emergency room. She has no complaints postoperatively her pain is gone and she is feeling well and tolerating a diet she is discharged in stable condition on oral analgesics and oral doxycycline for her chlamydia she'll follow-up in our office in 10 days etc. given about showering etc.  Consults: None  Disposition: 01-Home or Self Care     Medication List    STOP taking these medications        naproxen 500 MG tablet  Commonly known as:  NAPROSYN      TAKE these medications        doxycycline 100 MG tablet  Commonly known as:  VIBRA-TABS  Take 1 tablet (100 mg total) by mouth 2 (two) times daily.     oxyCODONE-acetaminophen 5-325 MG tablet  Commonly known as:  PERCOCET/ROXICET  Take 1 tablet by mouth every 4 (four) hours as needed for moderate pain.           Follow-up Information    Follow up with Dionne Milo, MD In 10 days.   Specialty:  Surgery   Why:  For wound re-check   Contact information:   [address] [place] [address] [phone]       Lattie Haw, MD, FACS

## 2015-07-24 NOTE — Anesthesia Postprocedure Evaluation (Signed)
Anesthesia Post Note  Patient: Brittany Young  Procedure(s) Performed: Procedure(s) (LRB): APPENDECTOMY LAPAROSCOPIC (N/A)  Patient location during evaluation: PACU Anesthesia Type: General Level of consciousness: awake and alert Pain management: pain level controlled Vital Signs Assessment: post-procedure vital signs reviewed and stable Respiratory status: spontaneous breathing, nonlabored ventilation, respiratory function stable and patient connected to nasal cannula oxygen Cardiovascular status: blood pressure returned to baseline and stable Postop Assessment: no signs of nausea or vomiting Anesthetic complications: no    Last Vitals:  Filed Vitals:   07/24/15 0036 07/24/15 0507  BP: 108/55 98/58  Pulse: 87 75  Temp: 36.8 C 36.7 C  Resp: 16 18    Last Pain:  Filed Vitals:   07/24/15 1123  PainSc: 0-No pain                 Lenard Simmer

## 2015-07-29 ENCOUNTER — Telehealth: Payer: Self-pay

## 2015-07-29 NOTE — Telephone Encounter (Signed)
Called patient once again at this time. Still not accepting calls. Will be glad to speak with patient if she calls in.

## 2015-07-29 NOTE — Telephone Encounter (Signed)
Post-discharge call made to patient at this time. No answer. Got a recording stating that "this person is not accepting calls at this time, please try your call again later."  Will call back at a later time.

## 2015-08-04 ENCOUNTER — Ambulatory Visit: Payer: Medicaid Other | Admitting: Surgery

## 2015-08-05 ENCOUNTER — Encounter: Payer: Self-pay | Admitting: Surgery

## 2015-08-05 ENCOUNTER — Ambulatory Visit (INDEPENDENT_AMBULATORY_CARE_PROVIDER_SITE_OTHER): Payer: Medicaid Other | Admitting: Surgery

## 2015-08-05 VITALS — BP 131/75 | HR 83 | Temp 98.2°F | Ht 63.0 in | Wt 128.0 lb

## 2015-08-05 DIAGNOSIS — K353 Acute appendicitis with localized peritonitis, without perforation or gangrene: Secondary | ICD-10-CM

## 2015-08-05 NOTE — Patient Instructions (Signed)
Please give [place] a cal if you have any questions or concerns.

## 2015-08-05 NOTE — Progress Notes (Signed)
Outpatient postop visit  08/05/2015  Brittany Young is an 20 y.o. female.    Procedure: Laparoscopic appendectomy  CC: nO problems  HPI: Patient is status post laparoscopic appendectomy and has no problems at this point. She wants to work and is feeling quite well.  Medications reviewed.    Physical Exam:  BP 131/75 mmHg  Pulse 83  Temp(Src) 98.2 F (36.8 C) (Oral)  Ht  (1.6 m)  Wt 128 lb (58.06 kg)  BMI 22.68 kg/m2  LMP 07/15/2015 (Exact Date)    PE: Soft nontender abdomen wounds clean without erythema or drainage    Assessment/Plan:  Pathology is reviewed and positive for appendicitis no other problems. Patient doing very well recommend follow up on an as-needed basis  Lattie Haw, MD, FACS

## 2016-05-09 ENCOUNTER — Emergency Department
Admission: EM | Admit: 2016-05-09 | Discharge: 2016-05-09 | Disposition: A | Discharge status: Home / Self Care | Payer: Medicaid Other | Attending: Emergency Medicine | Admitting: Emergency Medicine

## 2016-05-09 ENCOUNTER — Encounter: Payer: Self-pay | Admitting: Emergency Medicine

## 2016-05-09 DIAGNOSIS — L02412 Cutaneous abscess of left axilla: Secondary | ICD-10-CM | POA: Diagnosis not present

## 2016-05-09 DIAGNOSIS — R229 Localized swelling, mass and lump, unspecified: Secondary | ICD-10-CM | POA: Diagnosis present

## 2016-05-09 DIAGNOSIS — F1290 Cannabis use, unspecified, uncomplicated: Secondary | ICD-10-CM | POA: Insufficient documentation

## 2016-05-09 DIAGNOSIS — L739 Follicular disorder, unspecified: Secondary | ICD-10-CM

## 2016-05-09 DIAGNOSIS — F17200 Nicotine dependence, unspecified, uncomplicated: Secondary | ICD-10-CM | POA: Diagnosis not present

## 2016-05-09 MED ORDER — SULFAMETHOXAZOLE-TRIMETHOPRIM 800-160 MG PO TABS: 1.0000 | ORAL_TABLET | Freq: Two times a day (BID) | ORAL | 0 refills | Status: DC

## 2016-05-09 NOTE — ED Triage Notes (Signed)
Pt presents with abscess in left underarm area. Pt states first noticed three days ago but today the area became painful. Pt denies fever. Pt denies nausea and vomiting.

## 2016-05-09 NOTE — ED Provider Notes (Signed)
[facility] Emergency Department Provider Note  ________________________________   I have reviewed the triage vital signs and the nursing notes.   HISTORY  Chief Complaint Abscess   History limited by: Not Limited   HPI Brittany Young is a 20 y.o. female who presents to the emergency department today because of concern for pain and swelling to her left armpit. The patient states that she noticed it three days ago. It has become progressively more painful. She has noticed a small amount of discharge to that area. She denies any fever, nausea or vomiting. Denies any history of abscess or boils.    Past Medical History:  Diagnosis Date  . Ovarian cyst     Patient Active Problem List   Diagnosis Date Noted  . Appendicitis 07/23/2015  . Acute appendicitis with localized peritonitis     Past Surgical History:  Procedure Laterality Date  . CESAREAN SECTION N/A 09/2011  . LAPAROSCOPIC APPENDECTOMY N/A 07/23/2015   Procedure: APPENDECTOMY LAPAROSCOPIC;  Surgeon: Lattie Hawichard E Cooper, MD;  Location: [facility] ORS;  Service: General;  Laterality: N/A;    Prior to Admission medications   Not on File    Allergies Patient has no known allergies.  Family History  Problem Relation Age of Onset  . GER disease Mother   . Hypertension Mother   . Cancer Maternal Grandfather   . Diabetes Maternal Grandfather   . Hypertension Maternal Grandfather     Social History Social History  Substance Use Topics  . Smoking status: Current Some Day Smoker  . Smokeless tobacco: Not on file  . Alcohol use No    Review of Systems  Constitutional: Negative for fever. Cardiovascular: Negative for chest pain. Respiratory: Negative for shortness of breath. Gastrointestinal: Negative for abdominal pain, vomiting and diarrhea. Genitourinary: Negative for dysuria. Musculoskeletal: Negative for back pain. Skin: Positive for pain and swelling to left armpit. Neurological:  Negative for headaches, focal weakness or numbness.  10-point ROS otherwise negative.  ____________________________________________   PHYSICAL EXAM:  VITAL SIGNS: ED Triage Vitals  Enc Vitals Group     BP 05/09/16 1603 124/74     Pulse Rate 05/09/16 1603 89     Resp 05/09/16 1603 14     Temp 05/09/16 1603 98.3 F (36.8 C)     Temp Source 05/09/16 1603 Oral     SpO2 05/09/16 1603 100 %     Weight 05/09/16 1604 132 lb (59.9 kg)     Height 05/09/16 1604 5\' 3"  (1.6 m)     Head Circumference --      Peak Flow --      Pain Score 05/09/16 1609 7   Constitutional: Alert and oriented. Well appearing and in no distress. Eyes: Conjunctivae are normal. Normal extraocular movements. ENT   Head: Normocephalic and atraumatic.   Nose: No congestion/rhinnorhea.   Mouth/Throat: Mucous membranes are moist.   Neck: No stridor. Hematological/Lymphatic/Immunilogical: No cervical lymphadenopathy. Cardiovascular: Normal rate, regular rhythm.  No murmurs, rubs, or gallops.  Respiratory: Normal respiratory effort without tachypnea nor retractions. Breath sounds are clear and equal bilaterally. No wheezes/rales/rhonchi. Gastrointestinal: Soft and nontender. No distention.  Genitourinary: Deferred Musculoskeletal: Normal range of motion in all extremities.  Neurologic:  Normal speech and language. No gross focal neurologic deficits are appreciated.  Skin:  Some tenderness to left axilla, small amount of swelling without any fluctuance. Bedside US did not show any pocket of pus. Psychiatric: Mood and affect are normal. Speech and behavior are normal. Patient exhibits  appropriate insight and judgment.  ____________________________________________    LABS (pertinent positives/negatives)  None  ____________________________________________   EKG  None  ____________________________________________     RADIOLOGY  None  ____________________________________________   PROCEDURES  Procedures  ____________________________________________   INITIAL IMPRESSION / ASSESSMENT AND PLAN / ED COURSE  Pertinent labs & imaging results that were available during my care of the patient were reviewed by me and considered in my medical decision making (see chart for details).  Patient with likely folliculitis of the left axilla. Bedside US without any pus pockets. Will give antibiotics. Discussed return precautions.   ____________________________________________   FINAL CLINICAL IMPRESSION(S) / ED DIAGNOSES  Final diagnoses:  Folliculitis     Note: This dictation was prepared with Dragon dictation. Any transcriptional errors that result from this process are unintentional    Phineas Semen, MD 05/09/16 1706

## 2016-05-09 NOTE — Discharge Instructions (Signed)
Please seek medical attention for any high fevers, chest pain, shortness of breath, change in behavior, persistent vomiting, bloody stool or any other new or concerning symptoms.  

## 2016-11-03 ENCOUNTER — Encounter: Payer: Self-pay | Admitting: Obstetrics and Gynecology

## 2016-11-03 ENCOUNTER — Ambulatory Visit (INDEPENDENT_AMBULATORY_CARE_PROVIDER_SITE_OTHER): Payer: Medicaid Other | Admitting: Obstetrics and Gynecology

## 2016-11-03 VITALS — BP 126/85 | HR 88 | Ht 63.0 in | Wt 137.9 lb

## 2016-11-03 DIAGNOSIS — Z30432 Encounter for removal of intrauterine contraceptive device: Secondary | ICD-10-CM

## 2016-11-03 DIAGNOSIS — Z98891 History of uterine scar from previous surgery: Secondary | ICD-10-CM

## 2016-11-03 DIAGNOSIS — Z30015 Encounter for initial prescription of vaginal ring hormonal contraceptive: Secondary | ICD-10-CM | POA: Insufficient documentation

## 2016-11-03 DIAGNOSIS — Z8759 Personal history of other complications of pregnancy, childbirth and the puerperium: Secondary | ICD-10-CM

## 2016-11-03 DIAGNOSIS — Z22330 Carrier of Group B streptococcus: Secondary | ICD-10-CM

## 2016-11-03 HISTORY — DX: History of uterine scar from previous surgery: Z98.891

## 2016-11-03 NOTE — Progress Notes (Signed)
GYN ENCOUNTER NOTE  Subjective:       Brittany Young is a 21 y.o. G10P1001 female is here for gynecologic evaluation of the following issues:  1. IUD removal 2. Contraceptive management  Decubitus presents for removal of IUD after having it inserted 5 years ago. Cycles are regular. She is not experiencing any pelvic pain. She is uncertain about what form of contraception to use at this time. She is using condoms..     Gynecologic History Patient's last menstrual period was 10/13/2016 (exact date). Contraception: IUD Last Pap: Uncertain Last mammogram: NA  Obstetric History OB History  Gravida Para Term Preterm AB Living  SAB TAB Ectopic Multiple Live Births          1    # Outcome Date GA Lbr Len/2nd Weight Sex Delivery Anes PTL Lv  1 Term 2013   6 lb 1.9 oz (2.776 kg) M CS-LTranv   LIV      Past Medical History:  Diagnosis Date  . History of cesarean section 11/03/2016  . Ovarian cyst     Past Surgical History:  Procedure Laterality Date  . CESAREAN SECTION N/A 09/2011  . LAPAROSCOPIC APPENDECTOMY N/A 07/23/2015   Procedure: APPENDECTOMY LAPAROSCOPIC;  Surgeon: Lattie Haw, MD;  Location: [facility] ORS;  Service: General;  Laterality: N/A;    No current outpatient prescriptions on file prior to visit.   No current facility-administered medications on file prior to visit.     No Known Allergies  Social History   Social History  . Marital status: Single    Spouse name: N/A  . Number of children: N/A  . Years of education: N/A   Occupational History  . Not on file.   Social History Main Topics  . Smoking status: Light Tobacco Smoker    Types: Cigars  . Smokeless tobacco: Never Used  . Alcohol use No  . Drug use: Yes    Types: Marijuana     Comment: 2 x a week  . Sexual activity: Yes    Birth control/ protection: IUD, Condom   Other Topics Concern  . Not on file   Social History Narrative  . No narrative on file    Family History   Problem Relation Age of Onset  . GER disease Mother   . Hypertension Mother   . Cancer Maternal Grandfather   . Diabetes Maternal Grandfather   . Hypertension Maternal Grandfather     The following portions of the patient's history were reviewed and updated as appropriate: allergies, current medications, past family history, past medical history, past social history, past surgical history and problem list.  Review of Systems Per history of present illness  Objective:   BP 126/85   Pulse 88   Ht  (1.6 m)   Wt 137 lb 14.4 oz (62.6 kg)   LMP 10/13/2016 (Exact Date)   BMI 24.43 kg/m  CONSTITUTIONAL: Well-developed, well-nourished female in no acute distress.  HENT:  Normocephalic, atraumatic.  NECK: Not examined SKIN: Skin is warm and dry. No rash noted. Not diaphoretic. No erythema. No pallor. NEUROLGIC: Alert and oriented to person, place, and time. PSYCHIATRIC: Normal mood and affect. Normal behavior. Normal judgment and thought content. CARDIOVASCULAR:Not Examined RESPIRATORY: Not Examined BREASTS: Not Examined ABDOMEN: Soft, non distended; Non tender.  No Organomegaly. PELVIC:  External Genitalia: Normal  BUS: Normal  Vagina: Normal  Cervix: Normal; IUD string 3 cm; IUD is removed with [facility]  forceps  Uterus: Normal size, shape,consistency, mobile, midplane, nontender  Adnexa: Normal; nonpalpable and nontender  RV: External exam normal  Bladder: Nontender MUSCULOSKELETAL: Normal range of motion. No tenderness.  No cyanosis, clubbing, or edema.  PROCEDURE: IUD removal Consent is obtained. Patient was placed in dorsal lithotomy position. A Graves' speculum was used to identify the cervix and IUD strings. [place] forceps is used to grasp IUD strings and remove IUD intact. No complications. Blood loss minimal. Procedure well-tolerated.   Assessment:   1. Encounter for IUD removal  2. Encounter for initial prescription of vaginal ring hormonal contraceptive  3.  History of cesarean section  4. History of pregnancy induced hypertension  5. GBS carrier     Plan:   1.IUD removed 2. NuvaRing monthly prescribed 3. Return to [facility] health Department for physical exam 4. Encourage condom use for backup contraception  A total of 15 minutes were spent face-to-face with the patient during this encounter and over half of that time dealt with counseling and coordination of care.  Herold Harms, MD  Note: This dictation was prepared with Dragon dictation along with smaller phrase technology. Any transcriptional errors that result from this process are unintentional.

## 2016-11-03 NOTE — Patient Instructions (Addendum)
1. IUD  is removed today 2. Recommend starting NuvaRing intravaginal monthly 3. Recommend continuing condoms as backup contraception 4. Return to [facility] health Department for physical  Ethinyl Estradiol; Etonogestrel vaginal ring What is this medicine? ETHINYL ESTRADIOL; ETONOGESTREL (ETH in il es tra DYE ole; et oh noe JES trel) vaginal ring is a flexible, vaginal ring used as a contraceptive (birth control method). This medicine combines two types of female hormones, an estrogen and a progestin. This ring is used to prevent ovulation and pregnancy. Each ring is effective for one month. This medicine may be used for other purposes; ask your health care provider or pharmacist if you have questions. COMMON BRAND NAME(S): NuvaRing What should I tell my health care provider before I take this medicine? They need to know if you have or ever had any of these conditions: -abnormal vaginal bleeding -blood vessel disease or blood clots -breast, cervical, endometrial, ovarian, liver, or uterine cancer -diabetes -gallbladder disease -heart disease or recent heart attack -high blood pressure -high cholesterol -kidney disease -liver disease -migraine headaches -stroke -systemic lupus erythematosus (SLE) -tobacco smoker -an unusual or allergic reaction to estrogens, progestins, other medicines, foods, dyes, or preservatives -pregnant or trying to get pregnant -breast-feeding How should I use this medicine? Insert the ring into your vagina as directed. Follow the directions on the prescription label. The ring will remain place for 3 weeks and is then removed for a 1-week break. A new ring is inserted 1 week after the last ring was removed, on the same day of the week. Check often to make sure the ring is still in place, especially before and after sexual intercourse. If the ring was out of the vagina for an unknown amount of time, you may not be protected from pregnancy. Perform a pregnancy  test and call your doctor. Do not use more often than directed. A patient package insert for the product will be given with each prescription and refill. Read this sheet carefully each time. The sheet may change frequently. Contact your pediatrician regarding the use of this medicine in children. Special care may be needed. This medicine has been used in female children who have started having menstrual periods. Overdosage: If you think you have taken too much of this medicine contact a poison control center or emergency room at once. NOTE: This medicine is only for you. Do not share this medicine with others. What if I miss a dose? You will need to replace your vaginal ring once a month as directed. If the ring should slip out, or if you leave it in longer or shorter than you should, contact your health care professional for advice. What may interact with this medicine? Do not take this medicine with the following medication: -dasabuvir; ombitasvir; paritaprevir; ritonavir -ombitasvir; paritaprevir; ritonavir This medicine may also interact with the following medications: -acetaminophen -antibiotics or medicines for infections, especially rifampin, rifabutin, rifapentine, and griseofulvin, and possibly penicillins or tetracyclines -aprepitant -ascorbic acid (vitamin C) -atorvastatin -barbiturate medicines, such as phenobarbital -bosentan -carbamazepine -caffeine -clofibrate -cyclosporine -dantrolene -doxercalciferol -felbamate -grapefruit juice -hydrocortisone -medicines for anxiety or sleeping problems, such as diazepam or temazepam -medicines for diabetes, including pioglitazone -modafinil -mycophenolate -nefazodone -oxcarbazepine -phenytoin -prednisolone -ritonavir or other medicines for HIV infection or AIDS -rosuvastatin -selegiline -soy isoflavones supplements -St. John's wort -tamoxifen or raloxifene -theophylline -thyroid hormones -topiramate -warfarin This list  may not describe all possible interactions. Give your health care provider a list of all the medicines, herbs, non-prescription drugs, or dietary  supplements you use. Also tell them if you smoke, drink alcohol, or use illegal drugs. Some items may interact with your medicine. What should I watch for while using this medicine? Visit your doctor or health care professional for regular checks on your progress. You will need a regular breast and pelvic exam and Pap smear while on this medicine. Use an additional method of contraception during the first cycle that you use this ring. Do not use a diaphragm or female condom, as the ring can interfere with these birth control methods and their proper placement. If you have any reason to think you are pregnant, stop using this medicine right away and contact your doctor or health care professional. If you are using this medicine for hormone related problems, it may take several cycles of use to see improvement in your condition. Smoking increases the risk of getting a blood clot or having a stroke while you are using hormonal birth control, especially if you are more than 21 years old. You are strongly advised not to smoke. This medicine can make your body retain fluid, making your fingers, hands, or ankles swell. Your blood pressure can go up. Contact your doctor or health care professional if you feel you are retaining fluid. This medicine can make you more sensitive to the sun. Keep out of the sun. If you cannot avoid being in the sun, wear protective clothing and use sunscreen. Do not use sun lamps or tanning beds/booths. If you wear contact lenses and notice visual changes, or if the lenses begin to feel uncomfortable, consult your eye care specialist. In some women, tenderness, swelling, or minor bleeding of the gums may occur. Notify your dentist if this happens. Brushing and flossing your teeth regularly may help limit this. See your dentist regularly and  inform your dentist of the medicines you are taking. If you are going to have elective surgery, you may need to stop using this medicine before the surgery. Consult your health care professional for advice. This medicine does not protect you against HIV infection (AIDS) or any other sexually transmitted diseases. What side effects may I notice from receiving this medicine? Side effects that you should report to your doctor or health care professional as soon as possible: -breast tissue changes or discharge -changes in vaginal bleeding during your period or between your periods -chest pain -coughing up blood -dizziness or fainting spells -headaches or migraines -leg, arm or groin pain -severe or sudden headaches -stomach pain (severe) -sudden shortness of breath -sudden loss of coordination, especially on one side of the body -speech problems -symptoms of vaginal infection like itching, irritation or unusual discharge -tenderness in the upper abdomen -vomiting -weakness or numbness in the arms or legs, especially on one side of the body -yellowing of the eyes or skin Side effects that usually do not require medical attention (report to your doctor or health care professional if they continue or are bothersome): -breakthrough bleeding and spotting that continues beyond the 3 initial cycles of pills -breast tenderness -mood changes, anxiety, depression, frustration, anger, or emotional outbursts -increased sensitivity to sun or ultraviolet light -nausea -skin rash, acne, or brown spots on the skin -weight gain (slight) This list may not describe all possible side effects. Call your doctor for medical advice about side effects. You may report side effects to FDA at 1-800-FDA-1088. Where should I keep my medicine? Keep out of the reach of children. Store at room temperature between 15 and 30 degrees C (59 and  86 degrees F) for up to 4 months. The product will expire after 4 months.  Protect from light. Throw away any unused medicine after the expiration date. NOTE: This sheet is a summary. It may not cover all possible information. If you have questions about this medicine, talk to your doctor, pharmacist, or health care provider.  2018 [employer]vier/Gold Standard (2016-02-27 17:00:31)

## 2017-04-07 ENCOUNTER — Encounter: Payer: Self-pay | Admitting: Obstetrics and Gynecology

## 2017-07-12 ENCOUNTER — Encounter: Payer: Medicaid Other | Admitting: Obstetrics and Gynecology

## 2017-08-10 ENCOUNTER — Encounter: Payer: Self-pay | Admitting: Obstetrics and Gynecology

## 2017-08-10 ENCOUNTER — Ambulatory Visit: Payer: Medicaid Other | Admitting: Obstetrics and Gynecology

## 2017-08-10 VITALS — BP 122/81 | HR 93 | Ht 63.0 in | Wt 126.9 lb

## 2017-08-10 DIAGNOSIS — N939 Abnormal uterine and vaginal bleeding, unspecified: Secondary | ICD-10-CM

## 2017-08-10 DIAGNOSIS — Z308 Encounter for other contraceptive management: Secondary | ICD-10-CM | POA: Diagnosis not present

## 2017-08-10 LAB — POCT URINE PREGNANCY: Preg Test, Ur: NEGATIVE

## 2017-08-10 NOTE — Progress Notes (Signed)
Chief complaint: 1.  Abnormal uterine bleeding 2.  Unprotected intercourse 3.  Contraceptive management  Brittany Young presents today for evaluation of abnormal uterine bleeding.  Last menstrual period was 07/28/2017.  Previously she had been having regular monthly cycles.  She was not using any contraception other than condoms over the past months since her IUD was removed.  The patient was to begin NuvaRing but she decided against using that method.  Condom use has been intermittent.   Patient is contemplating pregnancy in the near future  Past Medical History:  Diagnosis Date  . History of cesarean section 11/03/2016  . Ovarian cyst    Past Surgical History:  Procedure Laterality Date  . CESAREAN SECTION N/A 09/2011  . LAPAROSCOPIC APPENDECTOMY N/A 07/23/2015   Procedure: APPENDECTOMY LAPAROSCOPIC;  Surgeon: Lattie Hawichard E Cooper, MD;  Location: [facility] ORS;  Service: General;  Laterality: N/A;   Review of Systems  Constitutional: Negative for chills, diaphoresis, fever and weight loss.  Gastrointestinal: Negative for abdominal pain, diarrhea, nausea and vomiting.  Genitourinary: Negative for dysuria, flank pain, frequency and urgency.  Musculoskeletal: Negative.   Skin: Negative for itching and rash.  Neurological: Negative.   Psychiatric/Behavioral: Negative.    OBJECTIVE: BP 122/81   Pulse 93   Ht 5\' 3"  (1.6 m)   Wt 126 lb 14.4 oz (57.6 kg)   LMP 07/28/2017   BMI 22.48 kg/m  Pleasant female in no acute distress.  Alert and oriented. Back: No CVA tenderness Abdomen: Soft, nontender without organomegaly Pelvic exam: External genitalia-normal BUS-normal Vagina-menstrual blood in vaginal vault; no obvious discharge Cervix-no lesions; minimal blood loss; no cervical motion tenderness Uterus-midplane, mobile, normal size, 1/4 tender, mobile Adnexa-nonpalpable nontender Rectovaginal-normal external exam  UPT-NEGATIVE  ASSESSMENT: 1.  Inconsistent use of condoms for contraception 2.   Abnormal uterine bleeding, possibly anovulatory 3.  Desiring pregnancy in near future  PLAN: 1.  New swab for GC/chlamydia/trichomonas ordered; UPT-negative 2.  Condoms strongly encouraged 3.  Prenatal vitamins encouraged 4.  Follow-up as needed.  A total of 15 minutes were spent face-to-face with the patient during this encounter and over half of that time dealt with counseling and coordination of care.  Herold HarmsMartin A Goldye Tourangeau, MD  Note: This dictation was prepared with Dragon dictation along with smaller phrase technology. Any transcriptional errors that result from this process are unintentional.

## 2017-08-10 NOTE — Patient Instructions (Signed)
1.  Screening for chlamydia, gonorrhea, and trichomonas is done today 2.  Management options for contraception were reviewed.  Patient is deciding upon condoms at this time.  She is contemplating pregnancy in the near future 3.  Recommend multivitamin or prenatal vitamin daily 4.  Follow-up as needed

## 2017-08-13 LAB — CHLAMYDIA/GONOCOCCUS/TRICHOMONAS, NAA
CHLAMYDIA BY NAA: NEGATIVE
GONOCOCCUS BY NAA: NEGATIVE
TRICH VAG BY NAA: POSITIVE — AB

## 2017-08-30 ENCOUNTER — Other Ambulatory Visit: Payer: Self-pay

## 2017-08-30 MED ORDER — METRONIDAZOLE 500 MG PO TABS: 500.0000 mg | ORAL_TABLET | Freq: Two times a day (BID) | ORAL | 0 refills | Status: DC

## 2017-08-30 NOTE — Telephone Encounter (Signed)
Pt aware of pos trich dx. Med erx. Aware she will need her partners treated. NO sex for 7 days after treatment. Encouraged condom use. Pt voices understanding.

## 2017-12-01 ENCOUNTER — Encounter: Payer: Medicaid Other | Admitting: Obstetrics and Gynecology

## 2018-02-23 ENCOUNTER — Other Ambulatory Visit: Payer: Self-pay

## 2018-02-23 ENCOUNTER — Emergency Department
Admission: EM | Admit: 2018-02-23 | Discharge: 2018-02-23 | Disposition: A | Discharge status: Home / Self Care | Payer: Medicaid Other | Attending: Emergency Medicine | Admitting: Emergency Medicine

## 2018-02-23 ENCOUNTER — Emergency Department: Payer: Medicaid Other

## 2018-02-23 ENCOUNTER — Encounter: Payer: Self-pay | Admitting: Emergency Medicine

## 2018-02-23 DIAGNOSIS — F17290 Nicotine dependence, other tobacco product, uncomplicated: Secondary | ICD-10-CM | POA: Insufficient documentation

## 2018-02-23 DIAGNOSIS — Z3490 Encounter for supervision of normal pregnancy, unspecified, unspecified trimester: Secondary | ICD-10-CM

## 2018-02-23 DIAGNOSIS — O9989 Other specified diseases and conditions complicating pregnancy, childbirth and the puerperium: Secondary | ICD-10-CM | POA: Insufficient documentation

## 2018-02-23 DIAGNOSIS — O2341 Unspecified infection of urinary tract in pregnancy, first trimester: Secondary | ICD-10-CM

## 2018-02-23 DIAGNOSIS — R102 Pelvic and perineal pain: Secondary | ICD-10-CM | POA: Diagnosis not present

## 2018-02-23 DIAGNOSIS — R109 Unspecified abdominal pain: Secondary | ICD-10-CM

## 2018-02-23 DIAGNOSIS — Z3A01 Less than 8 weeks gestation of pregnancy: Secondary | ICD-10-CM | POA: Insufficient documentation

## 2018-02-23 DIAGNOSIS — R110 Nausea: Secondary | ICD-10-CM | POA: Diagnosis not present

## 2018-02-23 DIAGNOSIS — O2691 Pregnancy related conditions, unspecified, first trimester: Secondary | ICD-10-CM | POA: Diagnosis present

## 2018-02-23 DIAGNOSIS — O2340 Unspecified infection of urinary tract in pregnancy, unspecified trimester: Secondary | ICD-10-CM | POA: Diagnosis not present

## 2018-02-23 DIAGNOSIS — O26891 Other specified pregnancy related conditions, first trimester: Secondary | ICD-10-CM

## 2018-02-23 LAB — URINALYSIS, COMPLETE (UACMP) WITH MICROSCOPIC
Bilirubin Urine: NEGATIVE
GLUCOSE, UA: NEGATIVE mg/dL
KETONES UR: 5 mg/dL — AB
Nitrite: NEGATIVE
PROTEIN: NEGATIVE mg/dL
Specific Gravity, Urine: 1.026 (ref 1.005–1.030)
pH: 5 (ref 5.0–8.0)

## 2018-02-23 LAB — COMPREHENSIVE METABOLIC PANEL
ALBUMIN: 4 g/dL (ref 3.5–5.0)
ALK PHOS: 43 U/L (ref 38–126)
ALT: 13 U/L (ref 0–44)
ANION GAP: 6 (ref 5–15)
AST: 16 U/L (ref 15–41)
BUN: 7 mg/dL (ref 6–20)
CALCIUM: 9 mg/dL (ref 8.9–10.3)
CO2: 25 mmol/L (ref 22–32)
Chloride: 107 mmol/L (ref 98–111)
Creatinine, Ser: 0.61 mg/dL (ref 0.44–1.00)
GFR calc non Af Amer: >60 mL/min (ref >60)
GLUCOSE: 98 mg/dL (ref 70–99)
POTASSIUM: 3.5 mmol/L (ref 3.5–5.1)
SODIUM: 138 mmol/L (ref 135–145)
Total Bilirubin: 0.6 mg/dL (ref 0.3–1.2)
Total Protein: 7.5 g/dL (ref 6.5–8.1)

## 2018-02-23 LAB — CBC
HEMATOCRIT: 36.4 % (ref 35.0–47.0)
HEMOGLOBIN: 12.3 g/dL (ref 12.0–16.0)
MCH: 28.3 pg (ref 26.0–34.0)
MCHC: 33.7 g/dL (ref 32.0–36.0)
MCV: 84 fL (ref 80.0–100.0)
Platelets: 263 10*3/uL (ref 150–440)
RBC: 4.33 MIL/uL (ref 3.80–5.20)
RDW: 12.9 % (ref 11.5–14.5)
WBC: 6.2 10*3/uL (ref 3.6–11.0)

## 2018-02-23 LAB — HCG, QUANTITATIVE, PREGNANCY: HCG, BETA CHAIN, QUANT, S: 13282 m[IU]/mL — AB (ref <5)

## 2018-02-23 MED ORDER — DOXYLAMINE-PYRIDOXINE 10-10 MG PO TBEC: DELAYED_RELEASE_TABLET | ORAL | 1 refills | Status: DC

## 2018-02-23 MED ORDER — CEPHALEXIN 500 MG PO CAPS: 500.0000 mg | ORAL_CAPSULE | Freq: Two times a day (BID) | ORAL | 0 refills | Status: DC

## 2018-02-23 MED ORDER — CEPHALEXIN 500 MG PO CAPS
500.0000 mg | ORAL_CAPSULE | Freq: Once | ORAL | Status: AC
  Administered 2018-02-23: 500 mg via ORAL
  Filled 2018-02-23: qty 1

## 2018-02-23 NOTE — ED Provider Notes (Signed)
[facility] Emergency Department Provider Note  ____________________________________________   First MD Initiated Contact with Patient 02/23/18 1743     (approximate)  I have reviewed the triage vital signs and the nursing notes.   HISTORY  Chief Complaint Dizziness    HPI Leonard Schwartzuqiela Stenerson is a 22 y.o. female G2P1 at about [redacted] weeks gestation who just found out she was pregnant about 5 days ago with a home pregnancy test.  She presents for evaluation of a variety of mild symptoms since learning she was pregnancy including some occasional lightheadedness, occasional intermittent lower abdominal pain, and nausea.  Her symptoms are mild and not dramatically affecting her life but she was concerned given the new pregnancy diagnosis.  She has had no vaginal bleeding.  She denies fever/chills, chest pain, shortness of breath, vomiting, and dysuria.  Nothing in particular makes the symptoms better nor worse.  She has an appointment scheduled in a couple of weeks with [facility].  She is ambulatory without difficulty.  She has no numbness or weakness in her extremities.   Past Medical History:  Diagnosis Date  . History of cesarean section 11/03/2016  . Ovarian cyst     Patient Active Problem List   Diagnosis Date Noted  . History of cesarean section 11/03/2016  . 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 History of pregnancy induced hypertension 11/03/2016  . Encounter for initial prescription of vaginal ring hormonal contraceptive 11/03/2016  . Encounter for IUD removal 11/03/2016  . Appendicitis 07/23/2015  . Acute appendicitis with localized peritonitis     Past Surgical History:  Procedure Laterality Date  . CESAREAN SECTION N/A 09/2011  . LAPAROSCOPIC APPENDECTOMY N/A 07/23/2015   Procedure: APPENDECTOMY LAPAROSCOPIC;  Surgeon: Lattie Hawichard E Cooper, MD;  Location: [facility] ORS;  Service: General;  Laterality: N/A;    Prior to Admission medications   Medication Sig Start  Date End Date Taking? Authorizing Provider  cephALEXin (KEFLEX) 500 MG capsule Take 1 capsule (500 mg total) by mouth 2 (two) times daily. 02/23/18   Loleta RoseForbach, Feige Lowdermilk, MD  Doxylamine-Pyridoxine (DICLEGIS) 10-10 MG TBEC Two tablets at bedtime on day 1 and 2; if symptoms persist, take 1 tablet in morning and 2 tablets at bedtime on day 3; if symptoms persist, may increase to 1 tablet in morning, 1 tablet mid-afternoon, and 2 tablets at bedtime on day 4 for a maximum of 4 tablets per day. Use the minimum dose necessary to control your symptoms. 02/23/18   Loleta RoseForbach, Maley Venezia, MD  metroNIDAZOLE (FLAGYL) 500 MG tablet Take 1 tablet (500 mg total) by mouth 2 (two) times daily. 08/30/17   Defrancesco, Prentice DockerMartin A, MD    Allergies Patient has no known allergies.  Family History  Problem Relation Age of Onset  . GER disease Mother   . Hypertension Mother   . Cancer Maternal Grandfather   . Diabetes Maternal Grandfather   . Hypertension Maternal Grandfather     Social History Social History   Tobacco Use  . Smoking status: Light Tobacco Smoker    Types: Cigars  . Smokeless tobacco: Never Used  Substance Use Topics  . Alcohol use: No    Alcohol/week: 0.0 standard drinks  . Drug use: Yes    Types: Marijuana    Comment: 1 x a week    Review of Systems Constitutional: No fever/chills Eyes: No visual changes. ENT: No sore throat. Cardiovascular: Denies chest pain. Respiratory: Denies shortness of breath. Gastrointestinal: Occasional intermittent lower abdominal pain.  Nausea, no vomiting.  No diarrhea.  No constipation. Genitourinary: Negative for dysuria. No vaginal bleeding. Musculoskeletal: Negative for neck pain.  Negative for back pain. Integumentary: Negative for rash. Neurological: Negative for headaches, focal weakness or numbness.   ____________________________________________   PHYSICAL EXAM:  VITAL SIGNS: ED Triage Vitals  Enc Vitals Group     BP 02/23/18 1542 120/73     Pulse  Rate 02/23/18 1542 90     Resp 02/23/18 1542 14     Temp 02/23/18 1542 98.5 F (36.9 C)     Temp Source 02/23/18 1542 Oral     SpO2 02/23/18 1542 100 %     Weight 02/23/18 1543 59 kg (130 lb)     Height 02/23/18 1543 1.6 m (5\' 3" )     Head Circumference --      Peak Flow --      Pain Score 02/23/18 1543 0     Pain Loc --      Pain Edu? --      Excl. in GC? --     Constitutional: Alert and oriented. Well appearing and in no acute distress. Eyes: Conjunctivae are normal.  Head: Atraumatic. Nose: No congestion/rhinnorhea. Mouth/Throat: Mucous membranes are moist. Neck: No stridor.  No meningeal signs.   Cardiovascular: Normal rate, regular rhythm. Good peripheral circulation. Grossly normal heart sounds. Respiratory: Normal respiratory effort.  No retractions. Lungs CTAB. Gastrointestinal: Soft and nontender. No distention.  Genitourinary: Deferred Musculoskeletal: No lower extremity tenderness nor edema. No gross deformities of extremities. Neurologic:  Normal speech and language. No gross focal neurologic deficits are appreciated.  Skin:  Skin is warm, dry and intact. No rash noted. Psychiatric: Mood and affect are normal. Speech and behavior are normal.  ____________________________________________   LABS (all labs ordered are listed, but only abnormal results are displayed)  Labs Reviewed  URINALYSIS, COMPLETE (UACMP) WITH MICROSCOPIC - Abnormal; Notable for the following components:      Result Value   Color, Urine YELLOW (*)    APPearance HAZY (*)    Hgb urine dipstick SMALL (*)    Ketones, ur 5 (*)    Leukocytes, UA SMALL (*)    Bacteria, UA RARE (*)    All other components within normal limits  HCG, QUANTITATIVE, PREGNANCY - Abnormal; Notable for the following components:   hCG, Beta Chain, Quant, S 13,282 (*)    All other components within normal limits  COMPREHENSIVE METABOLIC PANEL  CBC   ____________________________________________  EKG  None - EKG not  ordered by ED physician ____________________________________________  RADIOLOGY   ED MD interpretation: Gestational sac at 5 weeks and 5 days, no acute abnormalities but no identifiable fetus at this time  Official radiology report(s): US Ob Comp Less 14 Wks  Result Date: 02/23/2018 CLINICAL DATA:  RIGHT lower quadrant pain for 2 days, pregnant, quantitative beta hCG = 13,282 EXAM: OBSTETRIC <14 WK [place] AND TRANSVAGINAL OB US TECHNIQUE: Both transabdominal and transvaginal ultrasound examinations were performed for complete evaluation of the gestation as well as the maternal uterus, adnexal regions, and pelvic cul-de-sac. Transvaginal technique was performed to assess early pregnancy. COMPARISON:  None FINDINGS: Intrauterine gestational sac: Present, single Yolk sac:  Present Embryo:  Not visualized Cardiac Activity: N/A Heart Rate: N/A  bpm MSD: 9.3 mm   5 w   5 d Subchorionic hemorrhage:  None visualized. Maternal uterus/adnexae: Small corpus luteum RIGHT ovary. Ovaries otherwise normal appearance. Small amount of nonspecific free pelvic fluid. No additional adnexal masses. IMPRESSION: Gestational sac identified within  the uterus containing a yolk sac but no fetal pole is visualized to establish viability; viability could be established by follow-up ultrasound in 14 days if clinically indicated. Small amount of nonspecific free pelvic fluid. No other pelvic sonographic abnormalities identified. Electronically Signed   By: Ulyses Southward M.D.   On: 02/23/2018 18:51   US Ob Transvaginal  Result Date: 02/23/2018 CLINICAL DATA:  RIGHT lower quadrant pain for 2 days, pregnant, quantitative beta hCG = 13,282 EXAM: OBSTETRIC <14 WK [place] AND TRANSVAGINAL OB US TECHNIQUE: Both transabdominal and transvaginal ultrasound examinations were performed for complete evaluation of the gestation as well as the maternal uterus, adnexal regions, and pelvic cul-de-sac. Transvaginal technique was performed to assess early  pregnancy. COMPARISON:  None FINDINGS: Intrauterine gestational sac: Present, single Yolk sac:  Present Embryo:  Not visualized Cardiac Activity: N/A Heart Rate: N/A  bpm MSD: 9.3 mm   5 w   5 d Subchorionic hemorrhage:  None visualized. Maternal uterus/adnexae: Small corpus luteum RIGHT ovary. Ovaries otherwise normal appearance. Small amount of nonspecific free pelvic fluid. No additional adnexal masses. IMPRESSION: Gestational sac identified within the uterus containing a yolk sac but no fetal pole is visualized to establish viability; viability could be established by follow-up ultrasound in 14 days if clinically indicated. Small amount of nonspecific free pelvic fluid. No other pelvic sonographic abnormalities identified. Electronically Signed   By: Ulyses Southward M.D.   On: 02/23/2018 18:51    ____________________________________________   PROCEDURES  Critical Care performed: No   Procedure(s) performed:   Procedures   ____________________________________________   INITIAL IMPRESSION / ASSESSMENT AND PLAN / ED COURSE  As part of my medical decision making, I reviewed the following data within the electronic MEDICAL RECORD NUMBER Nursing notes reviewed and incorporated and Labs reviewed     Differential diagnosis includes, but is not limited to, no acute illness but rather symptoms associated with pregnancy and the new pregnancy diagnosis, ectopic pregnancy, UTI, ovarian cyst, torsion.  The patient's work-up is reassuring, vital signs are normal, some mild ketonuria which likely reflects decreased intake.  Possible UTI, treating empirically since patient is pregnant, and given first dose of Keflex 500 mg in the emergency department.  I explained to the patient that her ultrasound shows a gestational sac but without any evidence of fetus yet and she needs to follow-up as planned in the clinic.  She is ambulatory without any pain or tenderness nor lightheadedness/dizziness, and she has no  concerning signs or symptoms to make me worried that she would benefit from additional imaging or evaluation.  Her hCG also indicates early pregnancy, conference of metabolic panel and CBC are all within normal limits, and vital signs been stable.  I gave my usual customary return precautions and also provide a prescription for directly just for her nausea and she understands and agrees with the plan.     ____________________________________________  FINAL CLINICAL IMPRESSION(S) / ED DIAGNOSES  Final diagnoses:  Early stage of pregnancy  Abdominal pain during pregnancy in first trimester  Nausea  UTI (urinary tract infection) during pregnancy, first trimester     MEDICATIONS GIVEN DURING THIS VISIT:  Medications  cephALEXin (KEFLEX) capsule 500 mg (500 mg Oral Given 02/23/18 2008)     ED Discharge Orders         Ordered    Doxylamine-Pyridoxine (DICLEGIS) 10-10 MG TBEC     02/23/18 1943    cephALEXin (KEFLEX) 500 MG capsule  2 times daily     02/23/18  2004           Note:  This document was prepared using Dragon voice recognition software and may include unintentional dictation errors.    Loleta Rose, MD 02/23/18 2059

## 2018-02-23 NOTE — Discharge Instructions (Addendum)
Your workup in the Emergency Department today was reassuring.  We did not find any specific abnormalities.  You may have a urinary tract infection (UTI), and when you are pregnant, it is better to go ahead and treat you just to be sure, so please take the full course of cephalexin (Keflex) prescribed.  Your ultrasound reveals a gestational sac that puts you at about 5 weeks and 5 days of pregnancy, but it is early enough that a fetus cannot be visualized.  We recommend you drink plenty of fluids, take your regular medications and/or any new ones prescribed today, and follow up with the doctor(s) listed in these documents as recommended.  When you follow-up with your OB/GYN he will be able to do additional testing including blood work and a repeat ultrasound to make sure the baby is developing as anticipated.  Return to the Emergency Department if you develop new or worsening symptoms that concern you.

## 2018-02-23 NOTE — ED Triage Notes (Addendum)
Says positive pregnancy.  Says she has felt dizzy and also right lower quad pain.  Started few days ago

## 2018-02-28 ENCOUNTER — Ambulatory Visit (INDEPENDENT_AMBULATORY_CARE_PROVIDER_SITE_OTHER): Payer: Medicaid Other | Admitting: Obstetrics and Gynecology

## 2018-02-28 ENCOUNTER — Encounter: Payer: Self-pay | Admitting: Obstetrics and Gynecology

## 2018-02-28 VITALS — BP 103/67 | HR 89 | Ht 63.0 in | Wt 132.2 lb

## 2018-02-28 DIAGNOSIS — Z98891 History of uterine scar from previous surgery: Secondary | ICD-10-CM | POA: Diagnosis not present

## 2018-02-28 DIAGNOSIS — R102 Pelvic and perineal pain: Secondary | ICD-10-CM

## 2018-02-28 DIAGNOSIS — O219 Vomiting of pregnancy, unspecified: Secondary | ICD-10-CM | POA: Diagnosis not present

## 2018-02-28 DIAGNOSIS — Z22330 Carrier of Group B streptococcus: Secondary | ICD-10-CM

## 2018-02-28 NOTE — Patient Instructions (Signed)
1.  Urine pregnancy test is positive today. 2.  Ultrasound is scheduled in 1 week to confirm EDC 3.  Return in 3 weeks for new OB nurse intake 4.  Return in 5 weeks for new OB physical exam with Dr. 5.  Continue with directly just as needed for nausea 6.  Recommend gummy bears prenatal vitamins

## 2018-02-28 NOTE — Progress Notes (Signed)
GYN ENCOUNTER NOTE  Subjective:       Brittany Young is a 22 y.o. G2P1001 female is here for gynecologic evaluation of the following issues:  1.  Pregnancy confirmation.    02/18/2018-positive home pregnancy test 02/23/2018-ER evaluation for dizziness, nausea, and UTI; treated with Keflex and Diclegis 02/23/2018-ultrasound-5.5-week gestational sac without fetal pole; small amount of free fluid in pelvis; corpus luteum right ovary 02/23/2018 quantitative hCG 16,109  Prenatal risk factors: Prior C-section for arrest of dilation at 8 cm; infant 6 pounds 1.9 ounces; patient desires repeat C-section History of PIH and low platelets History of GBS carrier status   Gynecologic History Patient's last menstrual period was 01/14/2018.  Obstetric History OB History  Gravida Para Term Preterm AB Living  2 1 1     1   SAB TAB Ectopic Multiple Live Births          1    # Outcome Date GA Lbr Len/2nd Weight Sex Delivery Anes PTL Lv  2 Current           1 Term 2013   6 lb 1.9 oz (2.776 kg) M CS-LTranv   LIV    Past Medical History:  Diagnosis Date  . History of cesarean section 11/03/2016  . Ovarian cyst     Past Surgical History:  Procedure Laterality Date  . CESAREAN SECTION N/A 09/2011  . LAPAROSCOPIC APPENDECTOMY N/A 07/23/2015   Procedure: APPENDECTOMY LAPAROSCOPIC;  Surgeon: Lattie Haw, MD;  Location: [facility] ORS;  Service: General;  Laterality: N/A;    Current Outpatient Medications on File Prior to Visit  Medication Sig Dispense Refill  . cephALEXin (KEFLEX) 500 MG capsule Take 1 capsule (500 mg total) by mouth 2 (two) times daily. 14 capsule 0  . Doxylamine-Pyridoxine (DICLEGIS) 10-10 MG TBEC Two tablets at bedtime on day 1 and 2; if symptoms persist, take 1 tablet in morning and 2 tablets at bedtime on day 3; if symptoms persist, may increase to 1 tablet in morning, 1 tablet mid-afternoon, and 2 tablets at bedtime on day 4 for a maximum of 4 tablets per day. Use the minimum dose  necessary to control your symptoms. 60 tablet 1   No current facility-administered medications on file prior to visit.     No Known Allergies  Social History   Socioeconomic History  . Marital status: Single    Spouse name: Not on file  . Number of children: Not on file  . Years of education: Not on file  . Highest education level: Not on file  Occupational History  . Not on file  Social Needs  . Financial resource strain: Not on file  . Food insecurity:    Worry: Not on file    Inability: Not on file  . Transportation needs:    Medical: Not on file    Non-medical: Not on file  Tobacco Use  . Smoking status: Light Tobacco Smoker    Packs/day: 0.25    Types: Cigars  . Smokeless tobacco: Never Used  Substance and Sexual Activity  . Alcohol use: No    Alcohol/week: 0.0 standard drinks  . Drug use: Yes    Types: Marijuana    Comment: last used- 02/06/2018  . Sexual activity: Yes    Birth control/protection: None  Lifestyle  . Physical activity:    Days per week: Not on file    Minutes per session: Not on file  . Stress: Not on file  Relationships  . Social connections:  Talks on phone: Not on file    Gets together: Not on file    Attends religious service: Not on file    Active member of club or organization: Not on file    Attends meetings of clubs or organizations: Not on file    Relationship status: Not on file  . Intimate partner violence:    Fear of current or ex partner: Not on file    Emotionally abused: Not on file    Physically abused: Not on file    Forced sexual activity: Not on file  Other Topics Concern  . Not on file  Social History Narrative  . Not on file    Family History  Problem Relation Age of Onset  . GER disease Mother   . Hypertension Mother   . Cancer Maternal Grandfather   . Diabetes Maternal Grandfather   . Hypertension Maternal Grandfather     The following portions of the patient's history were reviewed and updated as  appropriate: allergies, current medications, past family history, past medical history, past social history, past surgical history and problem list.  Review of Systems Review of Systems  Constitutional: Positive for malaise/fatigue. Negative for chills, diaphoresis, fever and weight loss.  HENT: Negative.   Respiratory: Negative.   Cardiovascular: Negative.   Gastrointestinal: Positive for abdominal pain and nausea. Negative for vomiting.       Pelvic cramps without associated bleeding  Genitourinary: Positive for frequency. Negative for dysuria, flank pain and urgency.  Musculoskeletal: Negative.   Skin: Negative.   Neurological: Positive for dizziness. Negative for headaches.  Endo/Heme/Allergies: Negative.   Psychiatric/Behavioral: Negative.     Objective:   BP 103/67   Pulse 89   Ht 5\' 3"  (1.6 m)   Wt 132 lb 3.2 oz (60 kg)   LMP 01/14/2018   BMI 23.42 kg/m  CONSTITUTIONAL: Well-developed, well-nourished female in no acute distress.  Physical exam deferred   Assessment:   1.  Positive urine pregnancy test 2.  History of prior C-section for arrest of dilation at 8 cm; desires repeat C-section 3.  History of PIH, first pregnancy 4.  History of GBS carrier, first pregnancy 5.  UTI, treated 6.  Nausea, pregnancy related   Plan:   1.  Pelvic ultrasound-1 week to assess fetal viability and establish EDC 2.  Return in 3 weeks for new OB nursing visit 3.  Return in 4 weeks for new OB history and physical with MD 4.  Complete course of Keflex for UTI 5.  Continue with diclegis for pregnancy associated nausea 6.  New OB counseling: The patient has been given an overview regarding routine prenatal care. Recommendations regarding diet, weight gain, and exercise in pregnancy were given. Prenatal testing, optional genetic testing, and ultrasound use in pregnancy were reviewed.  Benefits of Breast Feeding were discussed. The patient is encouraged to consider nursing her baby  post partum.  A total of 25 minutes were spent face-to-face with the patient during this encounter and over half of that time involved counseling and coordination of care.  Herold HarmsMartin A Palestine Mosco, MD  Note: This dictation was prepared with Dragon dictation along with smaller phrase technology. Any transcriptional errors that result from this process are unintentional.

## 2018-03-02 ENCOUNTER — Encounter: Payer: Medicaid Other | Admitting: Obstetrics and Gynecology

## 2018-03-07 ENCOUNTER — Ambulatory Visit (INDEPENDENT_AMBULATORY_CARE_PROVIDER_SITE_OTHER): Payer: Medicaid Other

## 2018-03-07 DIAGNOSIS — Z3A01 Less than 8 weeks gestation of pregnancy: Secondary | ICD-10-CM

## 2018-03-07 DIAGNOSIS — Z98891 History of uterine scar from previous surgery: Secondary | ICD-10-CM

## 2018-03-07 DIAGNOSIS — O3411 Maternal care for benign tumor of corpus uteri, first trimester: Secondary | ICD-10-CM | POA: Diagnosis not present

## 2018-03-07 DIAGNOSIS — N8311 Corpus luteum cyst of right ovary: Secondary | ICD-10-CM

## 2018-03-07 DIAGNOSIS — R102 Pelvic and perineal pain: Secondary | ICD-10-CM

## 2018-03-27 NOTE — Progress Notes (Deleted)
Leonard Schwartzuqiela Bundick presents for NOB nurse interview visit. Pregnancy confirmation done __________. G-. P-. Pregnancy education material explained and given. ____ cats in home. NOB labs ordered. TSH/HbgA1c ordered due to BMI 30 or greater. Sickle cell ordered due to patient's race. HIV labs and drug screen were explained and ordered. PNV encouraged. Genetic screening options discussed. Genetic testing: Ordered/Declined/Unsure. Patient may discuss with the provider. Patient to follow up with provider in ____ weeks for NOB physical. All questions answered.

## 2018-03-31 ENCOUNTER — Ambulatory Visit: Payer: Medicaid Other | Admitting: Certified Nurse Midwife

## 2018-03-31 ENCOUNTER — Telehealth: Payer: Self-pay

## 2018-03-31 VITALS — BP 108/70 | HR 84 | Ht 63.0 in | Wt 138.0 lb

## 2018-03-31 DIAGNOSIS — Z202 Contact with and (suspected) exposure to infections with a predominantly sexual mode of transmission: Secondary | ICD-10-CM

## 2018-03-31 DIAGNOSIS — Z3481 Encounter for supervision of other normal pregnancy, first trimester: Secondary | ICD-10-CM

## 2018-03-31 NOTE — Progress Notes (Deleted)
Brittany Young presents for NOB nurse interview visit. Pregnancy confirmation done 02/23/18 at-----------. G2 P1001, EDD  Pregnancy education material explained and given. ____ cats in home. NOB labs ordered. Sickle cell ordered due to patient's race. HIV labs and drug screen were explained and ordered. PNV encouraged. Genetic screening options discussed. Genetic testing: Ordered/Declined/Unsure. Patient may discuss with the provider. Patient to follow up with provider in ____ weeks for NOB physical. Financial policy reviewed and FMLA form signed. All questions answered.

## 2018-03-31 NOTE — Progress Notes (Signed)
Brittany Young presents for NOB nurse interview visit. Pregnancy confirmation done . G2. P1001. Pregnancy education materials givenl explained and given. 0 cats in home. NOB labs ordered. Sickle cell ordered due to patient's race. HIV labs and drug screen were explained and ordered. PNV encouraged. Genetic screening options discussed. Genetic testing. Patient may discuss with the provider. Financial policy reviewed and FMLA form signed. Patient to follow up with provider in 2 weeks for NOB physical, Pt states she already has an appt on 04/04/18. All questions answered.

## 2018-04-01 ENCOUNTER — Encounter: Payer: Self-pay | Admitting: Emergency Medicine

## 2018-04-01 ENCOUNTER — Other Ambulatory Visit: Payer: Self-pay

## 2018-04-01 DIAGNOSIS — O26891 Other specified pregnancy related conditions, first trimester: Secondary | ICD-10-CM | POA: Diagnosis present

## 2018-04-01 DIAGNOSIS — R1030 Lower abdominal pain, unspecified: Secondary | ICD-10-CM | POA: Insufficient documentation

## 2018-04-01 DIAGNOSIS — Z3A10 10 weeks gestation of pregnancy: Secondary | ICD-10-CM | POA: Diagnosis not present

## 2018-04-01 LAB — URINALYSIS, ROUTINE W REFLEX MICROSCOPIC
BILIRUBIN UA: NEGATIVE
GLUCOSE, UA: NEGATIVE
KETONES UA: NEGATIVE
Leukocytes, UA: NEGATIVE
NITRITE UA: NEGATIVE
PROTEIN UA: NEGATIVE
RBC, UA: NEGATIVE
UUROB: 0.2 mg/dL (ref 0.2–1.0)
pH, UA: 6 (ref 5.0–7.5)

## 2018-04-01 NOTE — ED Triage Notes (Signed)
Pt reports being in the kitchen about 30 minutes ago and experiencing abdominal cramps which are all gone now at this time. Pt denies any pain at his time or bleeding or DC. Pt is [redacted] weeks pregnant and concerned.

## 2018-04-02 ENCOUNTER — Emergency Department
Admission: EM | Admit: 2018-04-02 | Discharge: 2018-04-02 | Disposition: A | Discharge status: Home / Self Care | Payer: Medicaid Other | Attending: Emergency Medicine | Admitting: Emergency Medicine

## 2018-04-02 DIAGNOSIS — R109 Unspecified abdominal pain: Secondary | ICD-10-CM

## 2018-04-02 LAB — CULTURE, OB URINE

## 2018-04-02 LAB — URINE CULTURE, OB REFLEX

## 2018-04-02 NOTE — ED Notes (Signed)
Assessment: pt states is pregnant, 10 weeks and had abdominal cramping without bleeding or discharge at 2200. Pt states she no longer has pain. Pt appears in no acute distress.

## 2018-04-02 NOTE — ED Provider Notes (Signed)
[facility] Emergency Department Provider Note  Time seen: 2:28 AM  I have reviewed the triage vital signs and the nursing notes.   HISTORY  Chief Complaint Abdominal Cramping    HPI Brittany Young is a 22 y.o. female approximately [redacted] weeks pregnant G2, P1 presents to the emergency department with abdominal cramping.  According to the patient around 10 PM tonight she was cooking dinner when she felt some abdominal cramping almost like menstrual cramping.  States it lasted several minutes and then completely resolved.  Denies any vaginal discharge, fluid leakage or bleeding.  Arrives pain-free but was concerned that she is pregnant.   Past Medical History:  Diagnosis Date  . History of cesarean section 11/03/2016  . Ovarian cyst     Patient Active Problem List   Diagnosis Date Noted  . History of cesarean section 11/03/2016  . 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 History of pregnancy induced hypertension 11/03/2016  . Appendicitis 07/23/2015  . Acute appendicitis with localized peritonitis     Past Surgical History:  Procedure Laterality Date  . CESAREAN SECTION N/A 09/2011  . LAPAROSCOPIC APPENDECTOMY N/A 07/23/2015   Procedure: APPENDECTOMY LAPAROSCOPIC;  Surgeon: Lattie Haw, MD;  Location: [facility] ORS;  Service: General;  Laterality: N/A;    Prior to Admission medications   Medication Sig Start Date End Date Taking? Authorizing Provider  cephALEXin (KEFLEX) 500 MG capsule Take 1 capsule (500 mg total) by mouth 2 (two) times daily. 02/23/18   Loleta Rose, MD  Doxylamine-Pyridoxine (DICLEGIS) 10-10 MG TBEC Two tablets at bedtime on day 1 and 2; if symptoms persist, take 1 tablet in morning and 2 tablets at bedtime on day 3; if symptoms persist, may increase to 1 tablet in morning, 1 tablet mid-afternoon, and 2 tablets at bedtime on day 4 for a maximum of 4 tablets per day. Use the minimum dose necessary to control your symptoms. 02/23/18   Loleta Rose, MD     No Known Allergies  Family History  Problem Relation Age of Onset  . GER disease Mother   . Hypertension Mother   . Cancer Maternal Grandfather   . Diabetes Maternal Grandfather   . Hypertension Maternal Grandfather     Social History Social History   Tobacco Use  . Smoking status: Light Tobacco Smoker    Packs/day: 0.25    Types: Cigars  . Smokeless tobacco: Never Used  Substance Use Topics  . Alcohol use: No    Alcohol/week: 0.0 standard drinks  . Drug use: Yes    Types: Marijuana    Comment: last used- 02/06/2018    Review of Systems Constitutional: Negative for fever. Cardiovascular: Negative for chest pain. Respiratory: Negative for shortness of breath. Gastrointestinal: Lower abdominal cramping, now resolved Genitourinary: Negative for urinary compaints.  Negative for vaginal bleeding or fluid leakage Musculoskeletal: Negative for musculoskeletal complaints Skin: Negative for skin complaints  Neurological: Negative for headache All other ROS negative  ____________________________________________   PHYSICAL EXAM:  VITAL SIGNS: ED Triage Vitals  Enc Vitals Group     BP 04/01/18 2348 126/83     Pulse Rate 04/01/18 2348 78     Resp 04/01/18 2348 18     Temp 04/01/18 2348 98.2 F (36.8 C)     Temp Source 04/01/18 2348 Oral     SpO2 04/01/18 2348 100 %     Weight 04/01/18 2349 138 lb (62.6 kg)     Height 04/01/18 2349 5\' 3"  (1.6 m)  Head Circumference --      Peak Flow --      Pain Score 04/01/18 2349 0     Pain Loc --      Pain Edu? --      Excl. in GC? --     Constitutional: Alert and oriented. Well appearing and in no distress. Eyes: Normal exam ENT   Head: Normocephalic and atraumatic.   Mouth/Throat: Mucous membranes are moist. Cardiovascular: Normal rate, regular rhythm. No murmur Respiratory: Normal respiratory effort without tachypnea nor retractions. Breath sounds are clear  Gastrointestinal: Soft and nontender. No  distention.  Musculoskeletal: Nontender with normal range of motion in all extremities.  Neurologic:  Normal speech and language. No gross focal neurologic deficits  Skin:  Skin is warm, dry and intact.  Psychiatric: Mood and affect are normal.   ____________________________________________   INITIAL IMPRESSION / ASSESSMENT AND PLAN / ED COURSE  Pertinent labs & imaging results that were available during my care of the patient were reviewed by me and considered in my medical decision making (see chart for details).  Patient presents emergency department for lower abdominal cramping around 10 PM which has completely resolved.  No vaginal discharge or bleeding.  Bedside ultrasound performed by myself shows a normal-appearing fetus heart rate of 167 bpm, great fetal movement.  Has a completely benign abdominal exam without any tenderness.  We will discharge the patient with routine OB follow-up.  Patient agreeable to plan of care.  ____________________________________________   FINAL CLINICAL IMPRESSION(S) / ED DIAGNOSES  Abdominal cramping    Minna Antis, MD 04/02/18 0230

## 2018-04-03 LAB — RPR: RPR Ser Ql: NONREACTIVE

## 2018-04-03 LAB — DRUG PROFILE, UR, 9 DRUGS (LABCORP)

## 2018-04-03 LAB — PARVOVIRUS B19 ANTIBODY, IGG AND IGM
Parvovirus B19 IgG: 6 index — ABNORMAL HIGH (ref 0.0–0.8)
Parvovirus B19 IgM: 0.2 index (ref 0.0–0.8)

## 2018-04-03 LAB — ABO AND RH: RH TYPE: POSITIVE

## 2018-04-03 LAB — RUBELLA SCREEN: Rubella Antibodies, IGG: 1.06 index (ref >0.99)

## 2018-04-03 LAB — HIV ANTIBODY (ROUTINE TESTING W REFLEX): HIV SCREEN 4TH GENERATION: NONREACTIVE

## 2018-04-03 LAB — SICKLE CELL SCREEN: Sickle Cell Screen: NEGATIVE

## 2018-04-03 LAB — HEPATITIS B SURFACE ANTIGEN: Hepatitis B Surface Ag: NEGATIVE

## 2018-04-03 LAB — HGB SOLU + RFLX FRAC: Sickle Solubility Test - HGBRFX: NEGATIVE

## 2018-04-03 LAB — VARICELLA ZOSTER ANTIBODY, IGG: Varicella zoster IgG: <135 index — ABNORMAL LOW (ref >165)

## 2018-04-03 LAB — NICOTINE SCREEN, URINE

## 2018-04-04 ENCOUNTER — Ambulatory Visit (INDEPENDENT_AMBULATORY_CARE_PROVIDER_SITE_OTHER): Payer: Medicaid Other | Admitting: Obstetrics and Gynecology

## 2018-04-04 ENCOUNTER — Encounter: Payer: Medicaid Other | Admitting: Obstetrics and Gynecology

## 2018-04-04 ENCOUNTER — Encounter: Payer: Self-pay | Admitting: Certified Nurse Midwife

## 2018-04-04 ENCOUNTER — Encounter: Payer: Self-pay | Admitting: Obstetrics and Gynecology

## 2018-04-04 VITALS — BP 120/62 | HR 89 | Ht 63.0 in | Wt 140.7 lb

## 2018-04-04 DIAGNOSIS — Z8619 Personal history of other infectious and parasitic diseases: Secondary | ICD-10-CM | POA: Insufficient documentation

## 2018-04-04 DIAGNOSIS — O34219 Maternal care for unspecified type scar from previous cesarean delivery: Secondary | ICD-10-CM | POA: Diagnosis not present

## 2018-04-04 DIAGNOSIS — O09899 Supervision of other high risk pregnancies, unspecified trimester: Secondary | ICD-10-CM

## 2018-04-04 DIAGNOSIS — Z3A11 11 weeks gestation of pregnancy: Secondary | ICD-10-CM | POA: Diagnosis not present

## 2018-04-04 DIAGNOSIS — Z3401 Encounter for supervision of normal first pregnancy, first trimester: Secondary | ICD-10-CM

## 2018-04-04 DIAGNOSIS — O09291 Supervision of pregnancy with other poor reproductive or obstetric history, first trimester: Secondary | ICD-10-CM

## 2018-04-04 DIAGNOSIS — Z283 Underimmunization status: Secondary | ICD-10-CM

## 2018-04-04 DIAGNOSIS — Z8759 Personal history of other complications of pregnancy, childbirth and the puerperium: Secondary | ICD-10-CM

## 2018-04-04 DIAGNOSIS — Z2839 Other underimmunization status: Secondary | ICD-10-CM | POA: Insufficient documentation

## 2018-04-04 DIAGNOSIS — Z98891 History of uterine scar from previous surgery: Secondary | ICD-10-CM

## 2018-04-04 LAB — POCT URINALYSIS DIPSTICK OB
Bilirubin, UA: NEGATIVE
Glucose, UA: NEGATIVE
Ketones, UA: NEGATIVE
LEUKOCYTES UA: NEGATIVE
Nitrite, UA: NEGATIVE
PH UA: 7 (ref 5.0–8.0)
POC,PROTEIN,UA: NEGATIVE
RBC UA: NEGATIVE
SPEC GRAV UA: 1.015 (ref 1.010–1.025)
UROBILINOGEN UA: 0.2 U/dL

## 2018-04-04 LAB — GC/CHLAMYDIA PROBE AMP
Chlamydia trachomatis, NAA: NEGATIVE
Neisseria gonorrhoeae by PCR: NEGATIVE

## 2018-04-04 MED ORDER — ASPIRIN EC 81 MG PO TBEC: 81.0000 mg | DELAYED_RELEASE_TABLET | Freq: Every day | ORAL | 2 refills | Status: DC

## 2018-04-04 NOTE — Progress Notes (Signed)
OBSTETRIC INITIAL PRENATAL VISIT  Subjective:    Brittany Young is being seen today for her first obstetrical visit.  This is not a planned pregnancy. She is a G2P1001 female at [redacted]w[redacted]d gestation, Estimated Date of Delivery: 10/23/18 with Patient's last menstrual period was 01/14/2018, not consistent with 7 week ER sono. Her obstetrical history is significant for h/o Cesarean section x 1 (for CPD) and GHTN at term. Relationship with FOB: significant other, living together. Patient does intend to breast feed. Pregnancy history fully reviewed.    OB History  Gravida Para Term Preterm AB Living  2 1 1  0 0 1  SAB TAB Ectopic Multiple Live Births  0 0 0 0 1    # Outcome Date GA Lbr Len/2nd Weight Sex Delivery Anes PTL Lv  2 Current           1 Term 2013 [redacted]w[redacted]d  6 lb 12 oz (3.062 kg) M CS-LTranv   LIV     Complications: Cephalopelvic Disproportion    Obstetric Comments  G1- C-section for CPD.  Had GHTN, was induced.     Gynecologic History:  Last pap smear was: patient has never had a pap smear.   Denies history of STIs.  Contraception: None.  Had a Mirena IUD, removed ~ 1 year prior to pregnancy.    Past Medical History:  Diagnosis Date  . History of cesarean section 11/03/2016  . Ovarian cyst      Family History  Problem Relation Age of Onset  . GER disease Mother   . Hypertension Mother   . Cancer Maternal Grandfather   . Diabetes Maternal Grandfather   . Hypertension Maternal Grandfather      Past Surgical History:  Procedure Laterality Date  . CESAREAN SECTION N/A 09/2011  . LAPAROSCOPIC APPENDECTOMY N/A 07/23/2015   Procedure: APPENDECTOMY LAPAROSCOPIC;  Surgeon: Lattie Haw, MD;  Location: [facility] ORS;  Service: General;  Laterality: N/A;    Social History   Socioeconomic History  . Marital status: Single    Spouse name: Not on file  . Number of children: Not on file  . Years of education: Not on file  . Highest education level: Not on file  Occupational  History  . Not on file  Social Needs  . Financial resource strain: Not on file  . Food insecurity:    Worry: Not on file    Inability: Not on file  . Transportation needs:    Medical: Not on file    Non-medical: Not on file  Tobacco Use  . Smoking status: Light Tobacco Smoker    Packs/day: 0.25    Types: Cigars  . Smokeless tobacco: Never Used  Substance and Sexual Activity  . Alcohol use: No    Alcohol/week: 0.0 standard drinks  . Drug use: Yes    Types: Marijuana    Comment: last used- 02/06/2018  . Sexual activity: Yes    Birth control/protection: None  Lifestyle  . Physical activity:    Days per week: Not on file    Minutes per session: Not on file  . Stress: Not on file  Relationships  . Social connections:    Talks on phone: Not on file    Gets together: Not on file    Attends religious service: Not on file    Active member of club or organization: Not on file    Attends meetings of clubs or organizations: Not on file    Relationship status: Not on file  .  Intimate partner violence:    Fear of current or ex partner: Not on file    Emotionally abused: Not on file    Physically abused: Not on file    Forced sexual activity: Not on file  Other Topics Concern  . Not on file  Social History Narrative  . Not on file     Current Outpatient Medications on File Prior to Visit  Medication Sig Dispense Refill  . Doxylamine-Pyridoxine (DICLEGIS) 10-10 MG TBEC Two tablets at bedtime on day 1 and 2; if symptoms persist, take 1 tablet in morning and 2 tablets at bedtime on day 3; if symptoms persist, may increase to 1 tablet in morning, 1 tablet mid-afternoon, and 2 tablets at bedtime on day 4 for a maximum of 4 tablets per day. Use the minimum dose necessary to control your symptoms. 60 tablet 1  . Prenatal Vit-Fe Fumarate-FA (PRENATAL MULTIVITAMIN) TABS tablet Take 1 tablet by mouth daily at 12 noon.     No current facility-administered medications on file prior to visit.       No Known Allergies   Review of Systems General:Not Present- Fever, Weight Loss and Weight Gain. Skin:Not Present- Rash. HEENT:Not Present- Blurred Vision, Headache and Bleeding Gums. Respiratory:Not Present- Difficulty Breathing. Breast:Not Present- Breast Mass. Cardiovascular:Not Present- Chest Pain, Elevated Blood Pressure, Fainting / Blacking Out and Shortness of Breath. Gastrointestinal:Not Present- Abdominal Pain, Constipation, Nausea and Vomiting. Female Genitourinary:Not Present- Frequency, Painful Urination, Pelvic Pain, Vaginal Bleeding, Vaginal Discharge, Contractions, regular, Fetal Movements Decreased, Urinary Complaints and Vaginal Fluid. Musculoskeletal:Not Present- Back Pain and Leg Cramps. Neurological:Not Present- Dizziness. Psychiatric:Not Present- Depression.     Objective:   Blood pressure 120/62, pulse 89, weight 140 lb 11.2 oz (63.8 kg), last menstrual period 01/14/2018.  Body mass index is 24.92 kg/m.  General Appearance:    Alert, cooperative, no distress, appears stated age  Head:    Normocephalic, without obvious abnormality, atraumatic  Eyes:    PERRL, conjunctiva/corneas clear, EOM's intact, both eyes  Ears:    Normal external ear canals, both ears  Nose:   Nares normal, septum midline, mucosa normal, no drainage or sinus tenderness  Throat:   Lips, mucosa, and tongue normal; teeth and gums normal  Neck:   Supple, symmetrical, trachea midline, no adenopathy; thyroid: no enlargement/tenderness/nodules; no carotid bruit or JVD  Back:     Symmetric, no curvature, ROM normal, no CVA tenderness  Lungs:     Clear to auscultation bilaterally, respirations unlabored  Chest Wall:    No tenderness or deformity   Heart:    Regular rate and rhythm, S1 and S2 normal, no murmur, rub or gallop  Breast Exam:    No tenderness, masses, or nipple abnormality  Abdomen:     Soft, non-tender, bowel sounds active all four quadrants, no masses, no  organomegaly.  FH 11 cm.  FHT 148 bpm.  Genitalia:    Pelvic:external genitalia normal, vagina without lesions, discharge, or tenderness, rectovaginal septum  normal. Cervix normal in appearance, no cervical motion tenderness, no adnexal masses or tenderness.  Pregnancy positive findings: uterine enlargement: 11 wk size, nontender.   Rectal:    Normal external sphincter.  No hemorrhoids appreciated. Internal exam not done.   Extremities:   Extremities normal, atraumatic, no cyanosis or edema  Pulses:   2+ and symmetric all extremities  Skin:   Skin color, texture, turgor normal, no rashes or lesions  Lymph nodes:   Cervical, supraclavicular, and axillary nodes normal  Neurologic:  CNII-XII intact, normal strength, sensation and reflexes throughout     Assessment:    Pregnancy at 11 and 1/7 weeks   H/o C-section x 1 H/o GHTN History of Parvovirus infection Varicella non-immune  Plan:   Initial labs reviewed.  H/o Parvovirus exposure but no active infection(IGG+, but IGM negative). Varicella non-immune, needs vaccine postpartum. Prenatal vitamins encouraged. Problem list reviewed and updated. New OB counseling:  The patient has been given an overview regarding routine prenatal care.  Recommendations regarding diet, weight gain, and exercise in pregnancy were given. Prenatal testing, optional genetic testing, and ultrasound use in pregnancy were reviewed.  Genetic testing: undecided. To discuss again next visit.  Benefits of Breast Feeding were discussed. The patient is encouraged to consider nursing her baby post partum. H/o prior C-section x 1 for CPD (infant 6 lbs 12 oz, pregnancy was at age 34, likely due to young pelvis). Discussed TOLAC vs repeat C-section, desires repeat.  H/o GHTN (patient notes at ~ 69 or 38 weeks with onset). Recommend daily 81 mg aspirin.  For flu vaccine next visit.  Follow up in 4 weeks.  50% of 30 min visit spent on counseling and coordination of care.     The patient has Medicaid.  [facility] Medicaid Risk Screening Form completed today   Hildred Laser, MD [facility]

## 2018-04-04 NOTE — Patient Instructions (Signed)
First Trimester of Pregnancy The first trimester of pregnancy is from week 1 until the end of week 13 (months 1 through 3). A week after a sperm fertilizes an egg, the egg will implant on the wall of the uterus. This embryo will begin to develop into a baby. Genes from you and your partner will form the baby. The female genes will determine whether the baby will be a boy or a girl. At 6-8 weeks, the eyes and face will be formed, and the heartbeat can be seen on ultrasound. At the end of 12 weeks, all the baby's organs will be formed. Now that you are pregnant, you will want to do everything you can to have a healthy baby. Two of the most important things are to get good prenatal care and to follow your health care provider's instructions. Prenatal care is all the medical care you receive before the baby's birth. This care will help prevent, find, and treat any problems during the pregnancy and childbirth. Body changes during your first trimester Your body goes through many changes during pregnancy. The changes vary from woman to woman.  You may gain or lose a couple of pounds at first.  You may feel sick to your stomach (nauseous) and you may throw up (vomit). If the vomiting is uncontrollable, call your health care provider.  You may tire easily.  You may develop headaches that can be relieved by medicines. All medicines should be approved by your health care provider.  You may urinate more often. Painful urination may mean you have a bladder infection.  You may develop heartburn as a result of your pregnancy.  You may develop constipation because certain hormones are causing the muscles that push stool through your intestines to slow down.  You may develop hemorrhoids or swollen veins (varicose veins).  Your breasts may begin to grow larger and become tender. Your nipples may stick out more, and the tissue that surrounds them (areola) may become darker.  Your gums may bleed and may be  sensitive to brushing and flossing.  Dark spots or blotches (chloasma, mask of pregnancy) may develop on your face. This will likely fade after the baby is born.  Your menstrual periods will stop.  You may have a loss of appetite.  You may develop cravings for certain kinds of food.  You may have changes in your emotions from day to day, such as being excited to be pregnant or being concerned that something may go wrong with the pregnancy and baby.  You may have more vivid and strange dreams.  You may have changes in your hair. These can include thickening of your hair, rapid growth, and changes in texture. Some women also have hair loss during or after pregnancy, or hair that feels dry or thin. Your hair will most likely return to normal after your baby is born.  What to expect at prenatal visits During a routine prenatal visit:  You will be weighed to make sure you and the baby are growing normally.  Your blood pressure will be taken.  Your abdomen will be measured to track your baby's growth.  The fetal heartbeat will be listened to between weeks 10 and 14 of your pregnancy.  Test results from any previous visits will be discussed.  Your health care provider may ask you:  How you are feeling.  If you are feeling the baby move.  If you have had any abnormal symptoms, such as leaking fluid, bleeding, severe headaches,   or abdominal cramping.  If you are using any tobacco products, including cigarettes, chewing tobacco, and electronic cigarettes.  If you have any questions.  Other tests that may be performed during your first trimester include:  Blood tests to find your blood type and to check for the presence of any previous infections. The tests will also be used to check for low iron levels (anemia) and protein on red blood cells (Rh antibodies). Depending on your risk factors, or if you previously had diabetes during pregnancy, you may have tests to check for high blood  sugar that affects pregnant women (gestational diabetes).  Urine tests to check for infections, diabetes, or protein in the urine.  An ultrasound to confirm the proper growth and development of the baby.  Fetal screens for spinal cord problems (spina bifida) and Down syndrome.  HIV (human immunodeficiency virus) testing. Routine prenatal testing includes screening for HIV, unless you choose not to have this test.  You may need other tests to make sure you and the baby are doing well.  Follow these instructions at home: Medicines  Follow your health care provider's instructions regarding medicine use. Specific medicines may be either safe or unsafe to take during pregnancy.  Take a prenatal vitamin that contains at least 600 micrograms (mcg) of folic acid.  If you develop constipation, try taking a stool softener if your health care provider approves. Eating and drinking  Eat a balanced diet that includes fresh fruits and vegetables, whole grains, good sources of protein such as meat, eggs, or tofu, and low-fat dairy. Your health care provider will help you determine the amount of weight gain that is right for you.  Avoid raw meat and uncooked cheese. These carry germs that can cause birth defects in the baby.  Eating four or five small meals rather than three large meals a day may help relieve nausea and vomiting. If you start to feel nauseous, eating a few soda crackers can be helpful. Drinking liquids between meals, instead of during meals, also seems to help ease nausea and vomiting.  Limit foods that are high in fat and processed sugars, such as fried and sweet foods.  To prevent constipation: ? Eat foods that are high in fiber, such as fresh fruits and vegetables, whole grains, and beans. ? Drink enough fluid to keep your urine clear or pale yellow. Activity  Exercise only as directed by your health care provider. Most women can continue their usual exercise routine during  pregnancy. Try to exercise for 30 minutes at least 5 days a week. Exercising will help you: ? Control your weight. ? Stay in shape. ? Be prepared for labor and delivery.  Experiencing pain or cramping in the lower abdomen or lower back is a good sign that you should stop exercising. Check with your health care provider before continuing with normal exercises.  Try to avoid standing for long periods of time. Move your legs often if you must stand in one place for a long time.  Avoid heavy lifting.  Wear low-heeled shoes and practice good posture.  You may continue to have sex unless your health care provider tells you not to. Relieving pain and discomfort  Wear a good support bra to relieve breast tenderness.  Take warm sitz baths to soothe any pain or discomfort caused by hemorrhoids. Use hemorrhoid cream if your health care provider approves.  Rest with your legs elevated if you have leg cramps or low back pain.  If you develop   varicose veins in your legs, wear support hose. Elevate your feet for 15 minutes, 3-4 times a day. Limit salt in your diet. Prenatal care  Schedule your prenatal visits by the twelfth week of pregnancy. They are usually scheduled monthly at first, then more often in the last 2 months before delivery.  Write down your questions. Take them to your prenatal visits.  Keep all your prenatal visits as told by your health care provider. This is important. Safety  Wear your seat belt at all times when driving.  Make a list of emergency phone numbers, including numbers for family, friends, the hospital, and police and fire departments. General instructions  Ask your health care provider for a referral to a local prenatal education class. Begin classes no later than the beginning of month 6 of your pregnancy.  Ask for help if you have counseling or nutritional needs during pregnancy. Your health care provider can offer advice or refer you to specialists for help  with various needs.  Do not use hot tubs, steam rooms, or saunas.  Do not douche or use tampons or scented sanitary pads.  Do not cross your legs for long periods of time.  Avoid cat litter boxes and soil used by cats. These carry germs that can cause birth defects in the baby and possibly loss of the fetus by miscarriage or stillbirth.  Avoid all smoking, herbs, alcohol, and medicines not prescribed by your health care provider. Chemicals in these products affect the formation and growth of the baby.  Do not use any products that contain nicotine or tobacco, such as cigarettes and e-cigarettes. If you need help quitting, ask your health care provider. You may receive counseling support and other resources to help you quit.  Schedule a dentist appointment. At home, brush your teeth with a soft toothbrush and be gentle when you floss. Contact a health care provider if:  You have dizziness.  You have mild pelvic cramps, pelvic pressure, or nagging pain in the abdominal area.  You have persistent nausea, vomiting, or diarrhea.  You have a bad smelling vaginal discharge.  You have pain when you urinate.  You notice increased swelling in your face, hands, legs, or ankles.  You are exposed to fifth disease or chickenpox.  You are exposed to German measles (rubella) and have never had it. Get help right away if:  You have a fever.  You are leaking fluid from your vagina.  You have spotting or bleeding from your vagina.  You have severe abdominal cramping or pain.  You have rapid weight gain or loss.  You vomit blood or material that looks like coffee grounds.  You develop a severe headache.  You have shortness of breath.  You have any kind of trauma, such as from a fall or a car accident. Summary  The first trimester of pregnancy is from week 1 until the end of week 13 (months 1 through 3).  Your body goes through many changes during pregnancy. The changes vary from  woman to woman.  You will have routine prenatal visits. During those visits, your health care provider will examine you, discuss any test results you may have, and talk with you about how you are feeling. This information is not intended to replace advice given to you by your health care provider. Make sure you discuss any questions you have with your health care provider. Document Released: 06/15/2001 Document Revised: 06/02/2016 Document Reviewed: 06/02/2016 Elsevier Interactive Patient Education  2018 [employer]vier   Inc.  

## 2018-04-04 NOTE — Progress Notes (Signed)
   PT is present today for her NOB PE. Pt stated that she not having morning sickness anymore, but is having some pain in the abd area and back pain.  No other  problems or concerns.

## 2018-04-04 NOTE — Progress Notes (Signed)
I have reviewed the record and concur with patient management and plan of care.    Aylyn Wenzler Michelle Emmali Karow, CNM [facility], [facility] 

## 2018-05-02 ENCOUNTER — Ambulatory Visit (INDEPENDENT_AMBULATORY_CARE_PROVIDER_SITE_OTHER): Payer: Medicaid Other | Admitting: Obstetrics and Gynecology

## 2018-05-02 ENCOUNTER — Encounter: Payer: Self-pay | Admitting: Obstetrics and Gynecology

## 2018-05-02 VITALS — BP 106/72 | HR 82 | Wt 140.0 lb

## 2018-05-02 DIAGNOSIS — Z3482 Encounter for supervision of other normal pregnancy, second trimester: Secondary | ICD-10-CM | POA: Diagnosis not present

## 2018-05-02 DIAGNOSIS — Z3481 Encounter for supervision of other normal pregnancy, first trimester: Secondary | ICD-10-CM

## 2018-05-02 LAB — POCT URINALYSIS DIPSTICK OB
BILIRUBIN UA: NEGATIVE
GLUCOSE, UA: NEGATIVE
KETONES UA: NEGATIVE
Leukocytes, UA: NEGATIVE
Nitrite, UA: NEGATIVE
POC,PROTEIN,UA: NEGATIVE
RBC UA: NEGATIVE
SPEC GRAV UA: 1.02 (ref 1.010–1.025)
Urobilinogen, UA: 0.2 E.U./dL
pH, UA: 6.5 (ref 5.0–8.0)

## 2018-05-02 NOTE — Addendum Note (Signed)
Addended by: Haskel Khan on: 05/02/2018 10:17 AM   Modules accepted: Orders

## 2018-05-02 NOTE — Addendum Note (Signed)
Addended by: Smith Mince on: 05/02/2018 10:13 AM   Modules accepted: Orders

## 2018-05-02 NOTE — Progress Notes (Signed)
Pt here today for ROB. Pt states is is feeling well and has no concerns.

## 2018-05-02 NOTE — Progress Notes (Signed)
ROB: She has no complaints.  She has decided on chromosomal testing and AFI for spina bifida.  Ordered today.  Follow-up in 4 weeks for FAS.

## 2018-05-03 LAB — ANTIBODY SCREEN: Antibody Screen: NEGATIVE

## 2018-05-04 LAB — AFP, SERUM, OPEN SPINA BIFIDA
AFP MOM: 2.45
AFP VALUE AFPOSL: 81.7 ng/mL
Gest. Age on Collection Date: 15 weeks
Maternal Age At EDD: 22.9 yr
OSBR Risk 1 IN: 633
Test Results:: NEGATIVE
WEIGHT: 140 [lb_av]

## 2018-05-11 ENCOUNTER — Telehealth: Payer: Self-pay

## 2018-05-11 NOTE — Telephone Encounter (Signed)
Pt returned call and was informed that genetic test results were negative for any abnormalities.

## 2018-05-11 NOTE — Telephone Encounter (Signed)
Unable to reach pt and no VM. Will try again later.

## 2018-05-30 ENCOUNTER — Ambulatory Visit (INDEPENDENT_AMBULATORY_CARE_PROVIDER_SITE_OTHER): Payer: Medicaid Other

## 2018-05-30 ENCOUNTER — Ambulatory Visit (INDEPENDENT_AMBULATORY_CARE_PROVIDER_SITE_OTHER): Payer: Medicaid Other | Admitting: Obstetrics and Gynecology

## 2018-05-30 VITALS — BP 99/61 | HR 83 | Wt 144.8 lb

## 2018-05-30 DIAGNOSIS — R102 Pelvic and perineal pain: Secondary | ICD-10-CM | POA: Diagnosis not present

## 2018-05-30 DIAGNOSIS — Z98891 History of uterine scar from previous surgery: Secondary | ICD-10-CM

## 2018-05-30 DIAGNOSIS — Z3482 Encounter for supervision of other normal pregnancy, second trimester: Secondary | ICD-10-CM

## 2018-05-30 DIAGNOSIS — Z1379 Encounter for other screening for genetic and chromosomal anomalies: Secondary | ICD-10-CM

## 2018-05-30 DIAGNOSIS — O34219 Maternal care for unspecified type scar from previous cesarean delivery: Secondary | ICD-10-CM

## 2018-05-30 LAB — POCT URINALYSIS DIPSTICK OB
Bilirubin, UA: NEGATIVE
Glucose, UA: NEGATIVE
KETONES UA: NEGATIVE
Leukocytes, UA: NEGATIVE
Nitrite, UA: NEGATIVE
PH UA: 7.5 (ref 5.0–8.0)
POC,PROTEIN,UA: NEGATIVE
RBC UA: NEGATIVE
SPEC GRAV UA: 1.01 (ref 1.010–1.025)
Urobilinogen, UA: 0.2 E.U./dL

## 2018-05-30 NOTE — Progress Notes (Signed)
ROB-Pt stated that she is having a lot of pain and pressure in the lower pelvic areas. No other complaints.

## 2018-05-30 NOTE — Progress Notes (Signed)
ROB: Patient notes pelvic pain and pressure that just started. Discussed round ligament pain and therapy. RTC in 2-3 week to f/u in complete anatomy scan, and 4 weeks for OB visit. 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 Spina bifida screen negative. Will repeat UDS as last one from [facility] labs was cancelled. RTC in 4 weeks for OB appt.

## 2018-06-05 LAB — MATERNIT21  PLUS CORE+ESS+SCA, BLOOD
CHROMOSOME 18: NEGATIVE
Chromosome 13: NEGATIVE
Chromosome 21: NEGATIVE
Y CHROMOSOME: NOT DETECTED

## 2018-06-06 ENCOUNTER — Telehealth: Payer: Self-pay | Admitting: Obstetrics and Gynecology

## 2018-06-06 NOTE — Telephone Encounter (Signed)
Marcelino DusterMichelle called from [facility], and she states an abnormal test result was uploaded to the patient's chart on Dec 2; she is requesting a call back about setting up the patient with a genetic counselor and also set her up to do an amniocentesis.  Her call back number is [phone], please advise, thanks.

## 2018-06-07 ENCOUNTER — Telehealth: Payer: Self-pay | Admitting: Obstetrics and Gynecology

## 2018-06-07 NOTE — Telephone Encounter (Signed)
Pt was called and [facility] that [facility] come into the office to speak more concerning the results of her genetic testing. Pt made an appointment for 06/09/18.

## 2018-06-09 ENCOUNTER — Ambulatory Visit (INDEPENDENT_AMBULATORY_CARE_PROVIDER_SITE_OTHER): Payer: Medicaid Other | Admitting: Obstetrics and Gynecology

## 2018-06-09 ENCOUNTER — Encounter: Payer: Self-pay | Admitting: Obstetrics and Gynecology

## 2018-06-09 VITALS — BP 112/67 | HR 86 | Ht 63.0 in | Wt 148.0 lb

## 2018-06-09 DIAGNOSIS — Z3482 Encounter for supervision of other normal pregnancy, second trimester: Secondary | ICD-10-CM

## 2018-06-09 DIAGNOSIS — O285 Abnormal chromosomal and genetic finding on antenatal screening of mother: Secondary | ICD-10-CM

## 2018-06-09 NOTE — Progress Notes (Signed)
Pt is present today for genetic testing results.

## 2018-06-09 NOTE — Telephone Encounter (Signed)
Pt was seen in the office today.  

## 2018-06-09 NOTE — Progress Notes (Signed)
Brittany Young is a 22 y.o. [id number] female who presents for consultation regarding abnormal MaterniT21  screen.  Partner is also with her today. He has testing suggestive of Turner syndrome.  Discussion had that MaterniT21 screen is a screening test, and does not confirm diagnosis of Turner syndrome.  Recommend referral to [facility] for further genetic counseling and evaluation. Will order.  Referral to MFM made for further genetic counseling and discussion of results.  All questions answered regarding possible diagnosis. Handout given for review.    A total of 15 minutes were spent face-to-face with the patient during this encounter and over half of that time dealt with counseling and coordination of care.   Hildred Laserherry, Eliazar Olivar, MD [facility]

## 2018-06-13 ENCOUNTER — Ambulatory Visit (INDEPENDENT_AMBULATORY_CARE_PROVIDER_SITE_OTHER): Payer: Medicaid Other

## 2018-06-13 DIAGNOSIS — Z3482 Encounter for supervision of other normal pregnancy, second trimester: Secondary | ICD-10-CM

## 2018-06-19 ENCOUNTER — Ambulatory Visit
Admission: RE | Admit: 2018-06-19 | Discharge: 2018-06-19 | Disposition: A | Discharge status: Home / Self Care | Payer: Medicaid Other | Source: Ambulatory Visit | Attending: Maternal & Fetal Medicine | Admitting: Maternal & Fetal Medicine

## 2018-06-19 ENCOUNTER — Other Ambulatory Visit: Payer: Self-pay

## 2018-06-19 VITALS — BP 106/67 | HR 89 | Temp 97.9°F | Resp 17 | Wt 145.4 lb

## 2018-06-19 DIAGNOSIS — O289 Unspecified abnormal findings on antenatal screening of mother: Secondary | ICD-10-CM

## 2018-06-19 DIAGNOSIS — Z3A22 22 weeks gestation of pregnancy: Secondary | ICD-10-CM | POA: Diagnosis not present

## 2018-06-19 HISTORY — DX: Essential (primary) hypertension: I10

## 2018-06-19 NOTE — Progress Notes (Signed)
Pt. Seen by me, agree with assessment and plan as outlined in [facility].

## 2018-06-19 NOTE — Progress Notes (Signed)
Referring Provider:  [facility] 40 minute consultation  Brittany Young was referred to [facility] for genetic counseling to review the results of her cell free fetal DNA testing which revealed an increased risk for Turner syndrome (45,X) in the pregnancy.  This note summarizes the information we discussed and may be helpful if questions arise. An ultrasound was also performed at the time of the visit.  We first discussed the testing which was ordered prior to this visit.  Non Invasive Prenatal testing (NIPT) or Circulating cell free fetal DNA testing is a screening test that may be used to determine whether or not the baby may have certain chromosome conditions including Down syndrome, trisomy 57, trisomy 80 or a sex chromosome condition. This test utilizes a maternal blood sample and DNA sequencing technology to isolate circulating cell free fetal DNA from maternal plasma. The fetal DNA can then be analyzed for DNA sequences that are derived from chromosomes 13, 18, 21, X and Y. If the overall amount of DNA is greater than or less than the expected level for any of these chromosomes, aneuploidy is suspected. An abnormal result on NIPT may truly reflect the chromosomes of the baby, may represent a chromosome difference in the placenta or the mother, or can simply be a false positive. 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 In placental testing, monosomy X is a common mosaic finding that is often confined to the placenta (CPM). However, true fetal involvement is often associated with phenotypic abnormality (Turner syndrome). The Positive Predictive Value (chance that a positive result is truly positive) is estimated to be 41%. This means that there is a chance of 59% that this is a false positive result.   To review, chromosomes are the structures inside each cell of  our bodies that contain all the instructions for growth and development.  There are typically 46 chromosomes in each cell.  They are matched into 23 pairs, with one member of each pair being inherited from each parent.  The first 22 pairs of chromosomes are the same in males and females.  The last pair determines the gender of the baby (called the sex chromosomes).  A female usually has two X chromosomes and a female usually has one X and one Y.  Extra or missing chromosomes or pieces of chromosomes may result in differences such as developmental differences, birth defects, or both.   Individuals with 45,X, or Turner syndrome, are females with one X chromosome and no second sex chromosome.  All individuals with Turner syndrome are females.  They have an increased chance for physical differences including structural heart defects, kidney malformations, reproductive tract anomalies,short stature and edema in the extremities and cystic hygroma. Girls with Turner syndrome may also have learning differences, often in the area of spatial perception, and are typically not able to have children. It is important to remember that there can be a lot of variability between persons with Turner syndrome.    We talked about the option of an amniocentesis to fully examine the chromosomes of this baby prior to delivery.  Amniocentesis involves the removal of a small amount of amniotic fluid from the sac surrounding the fetus with the use of a thin needle inserted through the maternal abdomen and uterus.  Ultrasound guidance is used throughout the procedure.  Fetal cells from amniotic fluid are directly evaluated and >  99.5% of chromosome problems and > 98% of open neural tube defects can be detected. This procedure is generally performed after the 15th week of pregnancy.  The main risks to this procedure include complications leading to miscarriage in less than 1 in 200 cases (0.5%).  Amniocentesis is the most accurate testing  available prior to birth to determine if these NIPT results are correct.  Due to the risks, Brittany Young and her husband declined amniocentesis.  They were encouraged to consider if they desire testing at the time of birth, and if so, we would recommend chromosome analysis on cord blood.  We obtained a detailed family history and pregnancy history.  This is the first pregnancy for this couple, and they reported no complications or exposures which are expected to increase the risk for birth defects. The father of the baby reported one of his brothers has a child who was born with his organs "on the wrong side". This condition, which may be referred to as situs inversus, can have various causes, and more information would be needed to accurately determine the recurrence chance for other family members. If genetic testing or other details are available, we are happy to discuss this further.  There was no evidence of abnormal positioning of the organs on ultrasound today. There were no other family members with birth defects, recurrent pregnancy loss or known genetic conditions.  Finally, we offered carrier testing for cystic fibrosis and spinal muscular atrophy per ACOG guidelines, which they declined.   A review of her labs showed that Brittany Young was negative for "sickle cell screening", which included a solubility test for sickle cell trait.  This testing cannot, however, detect other hemoglobin variants (such as C trait or E trait), so we offered the option of more complete testing through hemoglobinopathy evaluation.  The patient declined.  An ultrasound was performed at the time of the visit.  Results showed normal fetal anatomy at [redacted] weeks gestation, though ultrasound cannot diagnose or rule out all birth defects or chromosome conditions.  Because of the increased chance for heart defects in babies with Turner syndrome, we also sent a referral for a fetal echocardiogram to be performed at [facility] in [place].  They will contact Brittany Young to schedule this visit.  Please feel free to contact us at [phone] with any questions.  We would also suggest that after birth, she be seen by one of the pediatric geneticists who can be reached at [phone].    Cherly Andersoneborah F. Lucette Kratz, MS, CGC

## 2018-06-27 ENCOUNTER — Encounter: Payer: Self-pay | Admitting: Obstetrics and Gynecology

## 2018-06-27 ENCOUNTER — Ambulatory Visit (INDEPENDENT_AMBULATORY_CARE_PROVIDER_SITE_OTHER): Payer: Medicaid Other | Admitting: Obstetrics and Gynecology

## 2018-06-27 VITALS — BP 111/68 | HR 88 | Wt 152.7 lb

## 2018-06-27 DIAGNOSIS — Z3482 Encounter for supervision of other normal pregnancy, second trimester: Secondary | ICD-10-CM

## 2018-06-27 DIAGNOSIS — O285 Abnormal chromosomal and genetic finding on antenatal screening of mother: Secondary | ICD-10-CM

## 2018-06-27 DIAGNOSIS — O34219 Maternal care for unspecified type scar from previous cesarean delivery: Secondary | ICD-10-CM

## 2018-06-27 LAB — POCT URINALYSIS DIPSTICK OB
Bilirubin, UA: NEGATIVE
Blood, UA: NEGATIVE
GLUCOSE, UA: NEGATIVE
Ketones, UA: NEGATIVE
Leukocytes, UA: NEGATIVE
Nitrite, UA: NEGATIVE
POC,PROTEIN,UA: NEGATIVE
Spec Grav, UA: 1.01 (ref 1.010–1.025)
Urobilinogen, UA: 0.2 E.U./dL
pH, UA: 6 (ref 5.0–8.0)

## 2018-06-27 NOTE — Progress Notes (Signed)
Patient states that she is having increased back pain.

## 2018-06-27 NOTE — Addendum Note (Signed)
Addended by: Dorian PodWHEELEY, Cordarrell Sane J on: 06/27/2018 10:00 AM   Modules accepted: Orders

## 2018-06-27 NOTE — Progress Notes (Signed)
ROB: [facility].  [place]uled [place] [facility].  Follow-up here 4 weeks needs 1 hour GCT with next visit.  Patient having back pain requesting belly band.

## 2018-06-29 ENCOUNTER — Other Ambulatory Visit: Payer: Medicaid Other

## 2018-07-05 NOTE — L&D Delivery Note (Signed)
Delivery Summary for Brittany Young  Labor Events:   Preterm labor: Yes  Rupture date:   Rupture time:   Rupture type:   Fluid Color:   Induction:   Augmentation:   Complications:   Cervical ripening:          Delivery:   Episiotomy:   Lacerations:   Repair suture:   Repair # of packets:   Blood loss (ml): 1266   Information for the patient's newborn:  Cale, [name]    Delivery 09/28/2018 12:46 AM by  C-Section, Low Transverse Sex:  female Gestational Age: [redacted]w[redacted]d Delivery Clinician:   Living?:         APGARS  One minute Five minutes Ten minutes  Skin color:        Heart rate:        Grimace:        Muscle tone:        Breathing:        Totals: 8  9      Presentation/position:      Resuscitation:   Cord information:    Disposition of cord blood:     Blood gases sent?  Complications:   Placenta: Delivered:       appearance Newborn Measurements: Weight: 5 lb 1.8 oz (2320 g)  Height: 17.75"  Head circumference:    Chest circumference:    Other providers:    Additional  information: Forceps:   Vacuum:   Breech:   Observed anomalies       See Dr. Oretha Milch operative note for details of C-section procedure.  Hildred Laser, MD [facility]

## 2018-07-25 ENCOUNTER — Ambulatory Visit (INDEPENDENT_AMBULATORY_CARE_PROVIDER_SITE_OTHER): Payer: Medicaid Other | Admitting: Obstetrics and Gynecology

## 2018-07-25 ENCOUNTER — Other Ambulatory Visit: Payer: Medicaid Other

## 2018-07-25 VITALS — BP 101/65 | HR 75 | Wt 163.9 lb

## 2018-07-25 DIAGNOSIS — Z98891 History of uterine scar from previous surgery: Secondary | ICD-10-CM

## 2018-07-25 DIAGNOSIS — Z23 Encounter for immunization: Secondary | ICD-10-CM

## 2018-07-25 DIAGNOSIS — O34219 Maternal care for unspecified type scar from previous cesarean delivery: Secondary | ICD-10-CM

## 2018-07-25 DIAGNOSIS — Z3483 Encounter for supervision of other normal pregnancy, third trimester: Secondary | ICD-10-CM

## 2018-07-25 LAB — POCT URINALYSIS DIPSTICK OB
BILIRUBIN UA: NEGATIVE
Blood, UA: NEGATIVE
Glucose, UA: NEGATIVE
KETONES UA: NEGATIVE
LEUKOCYTES UA: NEGATIVE
NITRITE UA: NEGATIVE
PROTEIN: NEGATIVE
SPEC GRAV UA: 1.015 (ref 1.010–1.025)
Urobilinogen, UA: 0.2 E.U./dL
pH, UA: 6.5 (ref 5.0–8.0)

## 2018-07-25 MED ORDER — TETANUS-DIPHTH-ACELL PERTUSSIS 5-2.5-18.5 LF-MCG/0.5 IM SUSP: 0.5000 mL | Freq: Once | INTRAMUSCULAR | Status: DC

## 2018-07-25 NOTE — Progress Notes (Signed)
ROB-PT present today for prenatal care. Pt done 1 hr GT test today, signed BTC form, but refused tdap vaccine. Pt was given verbal description of the vaccine and why it is given. Pt still declined. Pt stated that she is having back and lower abd pain along with vaginal pressure. No other complaints.

## 2018-07-25 NOTE — Progress Notes (Signed)
ROB: Patient notes complaints of occasional abdominal discomfort and vaginal pressure. Mostly noted with fetal movement.  For 28 week labs today.  Desires to breast and bottle feed.  Desires unsure method for contraception (but may consider Mirena IUD). For Tdap today, signed blood consent. Reviewed recommendations from MFM regarding patient's abnormal genetic testing (increased risk for Turner's syndrome). Patient s/p normal fetal echo at [facility] in [place].  Patient declined amniocentesis.  Noted that cord blood sampling could be collected for analysis at delivery if patient desires testing, which they desire. RTC in 2 weeks.

## 2018-07-25 NOTE — Patient Instructions (Signed)
Tdap Vaccine (Tetanus, Diphtheria and Pertussis): What You Need to Know  1. Why get vaccinated?  Tetanus, diphtheria and pertussis are very serious diseases. Tdap vaccine can protect us from these diseases. And, Tdap vaccine given to pregnant women can protect newborn babies against pertussis..  TETANUS (Lockjaw) is rare in the [place] today. It causes painful muscle tightening and stiffness, usually all over the body.  · It can lead to tightening of muscles in the head and neck so you can't open your mouth, swallow, or sometimes even breathe. Tetanus kills about 1 out of 10 people who are infected even after receiving the best medical care.  DIPHTHERIA is also rare in the [place] today. It can cause a thick coating to form in the back of the throat.  · It can lead to breathing problems, heart failure, paralysis, and death.  PERTUSSIS (Whooping Cough) causes severe coughing spells, which can cause difficulty breathing, vomiting and disturbed sleep.  · It can also lead to weight loss, incontinence, and rib fractures. Up to 2 in 100 adolescents and 5 in 100 adults with pertussis are hospitalized or have complications, which could include pneumonia or death.  These diseases are caused by bacteria. Diphtheria and pertussis are spread from person to person through secretions from coughing or sneezing. Tetanus enters the body through cuts, scratches, or wounds.  Before vaccines, as many as 200,000 cases of diphtheria, 200,000 cases of pertussis, and hundreds of cases of tetanus, were reported in the [place] each year. Since vaccination began, reports of cases for tetanus and diphtheria have dropped by about 99% and for pertussis by about 80%.  2. Tdap vaccine  Tdap vaccine can protect adolescents and adults from tetanus, diphtheria, and pertussis. One dose of Tdap is routinely given at age 11 or 12. People who did not get Tdap at that age should get it as soon as possible.  Tdap is especially important  for healthcare professionals and anyone having close contact with a baby younger than 12 months.  Pregnant women should get a dose of Tdap during every pregnancy, to protect the newborn from pertussis. Infants are most at risk for severe, life-threatening complications from pertussis.  Another vaccine, called Td, protects against tetanus and diphtheria, but not pertussis. A Td booster should be given every 10 years. Tdap may be given as one of these boosters if you have never gotten Tdap before. Tdap may also be given after a severe cut or burn to prevent tetanus infection.  Your doctor or the person giving you the vaccine can give you more information.  Tdap may safely be given at the same time as other vaccines.  3. Some people should not get this vaccine  · A person who has ever had a life-threatening allergic reaction after a previous dose of any diphtheria, tetanus or pertussis containing vaccine, OR has a severe allergy to any part of this vaccine, should not get Tdap vaccine. Tell the person giving the vaccine about any severe allergies.  · Anyone who had coma or long repeated seizures within 7 days after a childhood dose of DTP or DTaP, or a previous dose of Tdap, should not get Tdap, unless a cause other than the vaccine was found. They can still get Td.  · Talk to your doctor if you:  ? have seizures or another nervous system problem,  ? had severe pain or swelling after any vaccine containing diphtheria, tetanus or pertussis,  ? ever had a condition   called Guillain-Barré Syndrome (GBS),  ? aren't feeling well on the day the shot is scheduled.  4. Risks  With any medicine, including vaccines, there is a chance of side effects. These are usually mild and go away on their own. Serious reactions are also possible but are rare.  Most people who get Tdap vaccine do not have any problems with it.  Mild problems following Tdap  (Did not interfere with activities)  · Pain where the shot was given (about 3 in 4  adolescents or 2 in 3 adults)  · Redness or swelling where the shot was given (about 1 person in 5)  · Mild fever of at least 100.4°F (up to about 1 in 25 adolescents or 1 in 100 adults)  · Headache (about 3 or 4 people in 10)  · Tiredness (about 1 person in 3 or 4)  · Nausea, vomiting, diarrhea, stomach ache (up to 1 in 4 adolescents or 1 in 10 adults)  · Chills, sore joints (about 1 person in 10)  · Body aches (about 1 person in 3 or 4)  · Rash, swollen glands (uncommon)  Moderate problems following Tdap  (Interfered with activities, but did not require medical attention)  · Pain where the shot was given (up to 1 in 5 or 6)  · Redness or swelling where the shot was given (up to about 1 in 16 adolescents or 1 in 12 adults)  · Fever over 102°F (about 1 in 100 adolescents or 1 in 250 adults)  · Headache (about 1 in 7 adolescents or 1 in 10 adults)  · Nausea, vomiting, diarrhea, stomach ache (up to 1 or 3 people in 100)  · Swelling of the entire arm where the shot was given (up to about 1 in 500).  Severe problems following Tdap  (Unable to perform usual activities; required medical attention)  · Swelling, severe pain, bleeding and redness in the arm where the shot was given (rare).  Problems that could happen after any vaccine:  · People sometimes faint after a medical procedure, including vaccination. Sitting or lying down for about 15 minutes can help prevent fainting, and injuries caused by a fall. Tell your doctor if you feel dizzy, or have vision changes or ringing in the ears.  · Some people get severe pain in the shoulder and have difficulty moving the arm where a shot was given. This happens very rarely.  · Any medication can cause a severe allergic reaction. Such reactions from a vaccine are very rare, estimated at fewer than 1 in a million doses, and would happen within a few minutes to a few hours after the vaccination.  As with any medicine, there is a very remote chance of a vaccine causing a serious  injury or death.  The safety of vaccines is always being monitored. For more information, visit: www.cdc.gov/vaccinesafety/  5. What if there is a serious problem?  What should I look for?  · Look for anything that concerns you, such as signs of a severe allergic reaction, very high fever, or unusual behavior.  Signs of a severe allergic reaction can include hives, swelling of the face and throat, difficulty breathing, a fast heartbeat, dizziness, and weakness. These would usually start a few minutes to a few hours after the vaccination.  What should I do?  · If you think it is a severe allergic reaction or other emergency that can't wait, call 9-1-1 or get the person to the nearest hospital. Otherwise,   call your doctor.  · Afterward, the reaction should be reported to the Vaccine Adverse Event Reporting System (VAERS). Your doctor might file this report, or you can do it yourself through the VAERS web site at www.vaers.hhs.gov, or by calling 1-800-822-7967.  VAERS does not give medical advice.  6. The National Vaccine Injury Compensation Program  The National Vaccine Injury Compensation Program (VICP) is a federal program that was created to compensate people who may have been injured by certain vaccines.  Persons who believe they may have been injured by a vaccine can learn about the program and about filing a claim by calling 1-800-338-2382 or visiting the VICP website at www.hrsa.gov/vaccinecompensation. There is a time limit to file a claim for compensation.  7. How can I learn more?  · Ask your doctor. He or she can give you the vaccine package insert or suggest other sources of information.  · Call your local or state health department.  · Contact the Centers for Disease Control and Prevention (CDC):  ? Call 1-800-232-4636 (1-800-CDC-INFO) or  ? Visit CDC's website at www.cdc.gov/vaccines  Vaccine Information Statement Tdap Vaccine (08/28/2013)  This information is not intended to replace advice given to you  by your health care provider. Make sure you discuss any questions you have with your health care provider.  Document Released: 12/21/2011 Document Revised: 02/06/2018 Document Reviewed: 02/06/2018  Elsevier Interactive Patient Education © 2019 Elsevier Inc.

## 2018-07-26 LAB — CBC
Hematocrit: 29.7 % — ABNORMAL LOW (ref 34.0–46.6)
Hemoglobin: 10 g/dL — ABNORMAL LOW (ref 11.1–15.9)
MCH: 28.1 pg (ref 26.6–33.0)
MCHC: 33.7 g/dL (ref 31.5–35.7)
MCV: 83 fL (ref 79–97)
Platelets: 214 10*3/uL (ref 150–450)
RBC: 3.56 x10E6/uL — ABNORMAL LOW (ref 3.77–5.28)
RDW: 12.5 % (ref 11.7–15.4)
WBC: 7.2 10*3/uL (ref 3.4–10.8)

## 2018-07-26 LAB — GLUCOSE, 1 HOUR GESTATIONAL: Gestational Diabetes Screen: 74 mg/dL (ref 65–139)

## 2018-07-26 LAB — RPR: RPR Ser Ql: NONREACTIVE

## 2018-08-08 ENCOUNTER — Ambulatory Visit (INDEPENDENT_AMBULATORY_CARE_PROVIDER_SITE_OTHER): Payer: Medicaid Other | Admitting: Obstetrics and Gynecology

## 2018-08-08 ENCOUNTER — Encounter: Payer: Medicaid Other | Admitting: Obstetrics and Gynecology

## 2018-08-08 ENCOUNTER — Encounter: Payer: Self-pay | Admitting: Obstetrics and Gynecology

## 2018-08-08 VITALS — BP 112/72 | HR 82 | Ht 63.0 in | Wt 164.5 lb

## 2018-08-08 DIAGNOSIS — Z3483 Encounter for supervision of other normal pregnancy, third trimester: Secondary | ICD-10-CM

## 2018-08-08 NOTE — Progress Notes (Signed)
ROB: Has occasional knee and foot pain.  Discussed use of Tylenol and leg elevation.  1 hour GCT today.  Patient has occasional heartburn discussed use of antacids.

## 2018-08-08 NOTE — Progress Notes (Signed)
Patient comes in today for ROB visit. Patient states that she is having ankle and knee pain. She is requesting something for heartburn. She has not tried anything over the counter.

## 2018-08-17 ENCOUNTER — Emergency Department
Admission: EM | Admit: 2018-08-17 | Discharge: 2018-08-17 | Disposition: A | Discharge status: Home / Self Care | Payer: Medicaid Other | Attending: Emergency Medicine | Admitting: Emergency Medicine

## 2018-08-17 ENCOUNTER — Other Ambulatory Visit: Payer: Self-pay

## 2018-08-17 ENCOUNTER — Emergency Department: Payer: Medicaid Other

## 2018-08-17 ENCOUNTER — Encounter: Payer: Self-pay | Admitting: Emergency Medicine

## 2018-08-17 DIAGNOSIS — Z87891 Personal history of nicotine dependence: Secondary | ICD-10-CM | POA: Insufficient documentation

## 2018-08-17 DIAGNOSIS — J101 Influenza due to other identified influenza virus with other respiratory manifestations: Secondary | ICD-10-CM | POA: Diagnosis not present

## 2018-08-17 DIAGNOSIS — R0981 Nasal congestion: Secondary | ICD-10-CM | POA: Diagnosis present

## 2018-08-17 LAB — GROUP A STREP BY PCR: Group A Strep by PCR: NOT DETECTED

## 2018-08-17 LAB — INFLUENZA PANEL BY PCR (TYPE A & B)
Influenza A By PCR: NEGATIVE
Influenza B By PCR: POSITIVE — AB

## 2018-08-17 MED ORDER — OSELTAMIVIR PHOSPHATE 75 MG PO CAPS: 75.0000 mg | ORAL_CAPSULE | Freq: Two times a day (BID) | ORAL | 0 refills | Status: DC

## 2018-08-17 NOTE — ED Notes (Signed)
Pt is [redacted] weeks pregnant. Pt denies any fevers, per pt has been having sore throat, bodyaches and "congestion" for the last "few days". Pt denies taking any medications at home to relief sx's. Pt states has been feeling SOB and "wheeaing" last night.

## 2018-08-17 NOTE — ED Triage Notes (Signed)
Pt arrives with concerns of productive cough, fever, and sore throat "for the last few days."

## 2018-08-17 NOTE — ED Provider Notes (Signed)
[facility] Emergency Department Provider Note  ____________________________________________  Time seen: Approximately 4:12 PM  I have reviewed the triage vital signs and the nursing notes.   HISTORY  Chief Complaint Cough and Sore Throat    HPI Brittany Young is a 23 y.o. female who presents the emergency department for complaint of nasal congestion, sore throat, cough, shortness of breath.  Patient reports that she has had symptoms for 2 to 3 days.  Her young son was diagnosed with influenza last week.  Patient reports a subjective fever by saying that she has felt hot but has not taken her temperature.  Patient denies any headache, neck pain or stiffness, chest pain, abdominal pain, nausea or vomiting, diarrhea or constipation.  Patient is pregnant and denies any complications with pregnancy.  She denies any decreased fetal movement, vaginal bleeding or discharge.  Patient has not been taking any medications for her symptoms as she is pregnant.    Past Medical History:  Diagnosis Date  . History of cesarean section 11/03/2016  . Hypertension    With past pregnancy  . Ovarian cyst     Patient Active Problem List   Diagnosis Date Noted  . Abnormal findings on prenatal screening   . History of parvovirus B19 infection 04/04/2018  . Maternal varicella, non-immune 04/04/2018  . History of cesarean section 11/03/2016  . History of pregnancy induced hypertension 11/03/2016    Past Surgical History:  Procedure Laterality Date  . CESAREAN SECTION N/A 09/2011  . LAPAROSCOPIC APPENDECTOMY N/A 07/23/2015   Procedure: APPENDECTOMY LAPAROSCOPIC;  Surgeon: Lattie Haw, MD;  Location: [facility] ORS;  Service: General;  Laterality: N/A;    Prior to Admission medications   Medication Sig Start Date End Date Taking? Authorizing Provider  oseltamivir (TAMIFLU) 75 MG capsule Take 1 capsule (75 mg total) by mouth 2 (two) times daily. 08/17/18   , Delorise Royals, PA-C   Prenatal Vit-Fe Fumarate-FA (PRENATAL MULTIVITAMIN) TABS tablet Take 1 tablet by mouth daily at 12 noon.    [provider]    Allergies Patient has no known allergies.  Family History  Problem Relation Age of Onset  . GER disease Mother   . Hypertension Mother   . Cancer Maternal Grandfather   . Diabetes Maternal Grandfather   . Hypertension Maternal Grandfather     Social History Social History   Tobacco Use  . Smoking status: Former Smoker    Packs/day: 0.25    Types: Cigars  . Smokeless tobacco: Never Used  Substance Use Topics  . Alcohol use: No    Alcohol/week: 0.0 standard drinks  . Drug use: Not Currently    Types: Marijuana    Comment: last used- 02/06/2018     Review of Systems  Constitutional: Subjective fever/chills Eyes: No visual changes. No discharge ENT: Positive for nasal congestion and sore throat Cardiovascular: no chest pain. Respiratory: Positive cough. No SOB. Gastrointestinal: No abdominal pain.  No nausea, no vomiting.  No diarrhea.  No constipation. Genitourinary: Negative for dysuria. No hematuria.  No vaginal bleeding or discharge.  No pregnancy complaints. Musculoskeletal: Negative for musculoskeletal pain. Skin: Negative for rash, abrasions, lacerations, ecchymosis. Neurological: Negative for headaches, focal weakness or numbness. 10-point ROS otherwise negative.  ____________________________________________   PHYSICAL EXAM:  VITAL SIGNS: ED Triage Vitals  Enc Vitals Group     BP 08/17/18 1422 109/62     Pulse Rate 08/17/18 1422 87     Resp --      Temp 08/17/18  1422 98.1 F (36.7 C)     Temp Source 08/17/18 1422 Oral     SpO2 08/17/18 1422 99 %     Weight 08/17/18 1422 170 lb (77.1 kg)     Height 08/17/18 1422 5\' 3"  (1.6 m)     Head Circumference --      Peak Flow --      Pain Score 08/17/18 1435 7     Pain Loc --      Pain Edu? --      Excl. in GC? --      Constitutional: Alert and oriented. Well appearing  and in no acute distress. Eyes: Conjunctivae are normal. PERRL. EOMI. Head: Atraumatic. ENT:      Ears: EACs unremarkable bilaterally.  TMs are minimally bulging but not erythematous and not edematous.      Nose: Moderate clear congestion/rhinnorhea.      Mouth/Throat: Mucous membranes are moist.  Oropharynx is mildly erythematous, nonedematous.  Use midline.  Tonsils are erythematous and edematous bilaterally.  No appreciable exudates. Neck: No stridor.  Neck is supple full range of motion Hematological/Lymphatic/Immunilogical: No cervical lymphadenopathy. Cardiovascular: Normal rate, regular rhythm. Normal S1 and S2.  Good peripheral circulation. Respiratory: Normal respiratory effort without tachypnea or retractions. Lungs with a few crackles in the lower lung field.  No rales or rhonchi.  No wheezing.Peri Jefferson air entry to the bases with no decreased or absent breath sounds. Gastrointestinal: Visibly gravid abdomen.  Bowel sounds 4 quadrants. Soft and nontender to palpation.  Fundus above the level of the umbilicus.  Fetal movement appreciated on palpation.  No guarding or rigidity. No palpable masses. No distention. No CVA tenderness. Musculoskeletal: Full range of motion to all extremities. No gross deformities appreciated. Neurologic:  Normal speech and language. No gross focal neurologic deficits are appreciated.  Skin:  Skin is warm, dry and intact. No rash noted. Psychiatric: Mood and affect are normal. Speech and behavior are normal. Patient exhibits appropriate insight and judgement.   ____________________________________________   LABS (all labs ordered are listed, but only abnormal results are displayed)  Labs Reviewed  INFLUENZA PANEL BY PCR (TYPE A & B) - Abnormal; Notable for the following components:      Result Value   Influenza B By PCR POSITIVE (*)    All other components within normal limits  GROUP A STREP BY PCR    ____________________________________________  EKG   ____________________________________________  RADIOLOGY I personally viewed and evaluated these images as part of my medical decision making, as well as reviewing the written report by the radiologist.  I concur with radiologist finding of no acute cardiopulmonary abnormality.  Dg Chest 2 View  Result Date: 08/17/2018 CLINICAL DATA:  Cough and sore throat with nasal congestion today. EXAM: CHEST - 2 VIEW COMPARISON:  None. FINDINGS: The heart size and mediastinal contours are within normal limits. Both lungs are clear. The visualized skeletal structures are unremarkable. Nipple piercing on the left. IMPRESSION: No active cardiopulmonary disease. Electronically Signed   By: Tollie Eth M.D.   On: 08/17/2018 16:48    ____________________________________________    PROCEDURES  Procedure(s) performed:    Procedures    Medications - No data to display   ____________________________________________   INITIAL IMPRESSION / ASSESSMENT AND PLAN / ED COURSE  Pertinent labs & imaging results that were available during my care of the patient were reviewed by me and considered in my medical decision making (see chart for details).  Review of the [place] CSRS  was performed in accordance of the [facility] prior to dispensing any controlled drugs.      Patient's diagnosis is consistent with influenza.  Patient presents emergency department with flulike symptoms.  Patient is [redacted] weeks pregnant but has no pregnancy complications.  Patient had a positive influenza test in the emergency department which matches symptoms.  Patient will be started on Tamiflu.  Patient is given instructions to use Tylenol for additional symptom control medication.  Most of the other symptom control medications patient is unable to take due to pregnancy status.  Follow-up with OB/GYN or primary care as needed.  Return precautions are discussed at length with  patient..Patient is given ED precautions to return to the ED for any worsening or new symptoms.     ____________________________________________  FINAL CLINICAL IMPRESSION(S) / ED DIAGNOSES  Final diagnoses:  Influenza B      NEW MEDICATIONS STARTED DURING THIS VISIT:  ED Discharge Orders         Ordered    oseltamivir (TAMIFLU) 75 MG capsule  2 times daily     08/17/18 1824              This chart was dictated using voice recognition software/Dragon. Despite best efforts to proofread, errors can occur which can change the meaning. Any change was purely unintentional.    Racheal PatchesCuthriell,  D, PA-C 08/17/18 1825    Sharman CheekStafford, Phillip, MD 08/18/18 [phone]

## 2018-08-30 ENCOUNTER — Encounter: Payer: Self-pay | Admitting: Obstetrics and Gynecology

## 2018-08-30 ENCOUNTER — Ambulatory Visit (INDEPENDENT_AMBULATORY_CARE_PROVIDER_SITE_OTHER): Payer: Medicaid Other | Admitting: Obstetrics and Gynecology

## 2018-08-30 VITALS — BP 111/69 | HR 83 | Wt 173.6 lb

## 2018-08-30 DIAGNOSIS — O34219 Maternal care for unspecified type scar from previous cesarean delivery: Secondary | ICD-10-CM

## 2018-08-30 DIAGNOSIS — Z98891 History of uterine scar from previous surgery: Secondary | ICD-10-CM

## 2018-08-30 DIAGNOSIS — Z3483 Encounter for supervision of other normal pregnancy, third trimester: Secondary | ICD-10-CM

## 2018-08-30 LAB — POCT URINALYSIS DIPSTICK OB
Bilirubin, UA: NEGATIVE
Blood, UA: NEGATIVE
Glucose, UA: NEGATIVE
Ketones, UA: NEGATIVE
Leukocytes, UA: NEGATIVE
NITRITE UA: NEGATIVE
PROTEIN: NEGATIVE
Spec Grav, UA: 1.01 (ref 1.010–1.025)
Urobilinogen, UA: 0.2 E.U./dL
pH, UA: 6.5 (ref 5.0–8.0)

## 2018-08-30 NOTE — Progress Notes (Signed)
ROB: Notes back pain and menstrual-like cramping.  Is using back support brace. Discussed Tylenol, warm compresses/baths, resting in evenings.  Discussed repeat C-section delivery date planning.  Patient and partner strongly desires to be delivered on 4/20 (her EDD), however counseled on risk of going into labor prior.  Patient and partner desire to think over a date.  Discussed birthplace tours, given handouts. RTC in 2 weeks.

## 2018-08-30 NOTE — Progress Notes (Signed)
ROB-pt stated that she is having a lot of vaginal pressure and lower back pain. No other complaints.

## 2018-09-01 ENCOUNTER — Emergency Department
Admission: EM | Admit: 2018-09-01 | Discharge: 2018-09-01 | Disposition: A | Discharge status: Home / Self Care | Payer: Medicaid Other | Attending: Emergency Medicine | Admitting: Emergency Medicine

## 2018-09-01 ENCOUNTER — Emergency Department: Payer: Medicaid Other

## 2018-09-01 ENCOUNTER — Encounter: Payer: Self-pay | Admitting: Emergency Medicine

## 2018-09-01 ENCOUNTER — Other Ambulatory Visit: Payer: Self-pay

## 2018-09-01 DIAGNOSIS — O10913 Unspecified pre-existing hypertension complicating pregnancy, third trimester: Secondary | ICD-10-CM | POA: Diagnosis not present

## 2018-09-01 DIAGNOSIS — J181 Lobar pneumonia, unspecified organism: Secondary | ICD-10-CM | POA: Diagnosis not present

## 2018-09-01 DIAGNOSIS — O9989 Other specified diseases and conditions complicating pregnancy, childbirth and the puerperium: Secondary | ICD-10-CM | POA: Diagnosis present

## 2018-09-01 DIAGNOSIS — Z3A32 32 weeks gestation of pregnancy: Secondary | ICD-10-CM | POA: Diagnosis not present

## 2018-09-01 DIAGNOSIS — Z79899 Other long term (current) drug therapy: Secondary | ICD-10-CM | POA: Insufficient documentation

## 2018-09-01 DIAGNOSIS — J189 Pneumonia, unspecified organism: Secondary | ICD-10-CM

## 2018-09-01 LAB — CBC WITH DIFFERENTIAL/PLATELET
ABS IMMATURE GRANULOCYTES: 0.14 10*3/uL — AB (ref 0.00–0.07)
Basophils Absolute: 0 10*3/uL (ref 0.0–0.1)
Basophils Relative: 0 %
EOS ABS: 0 10*3/uL (ref 0.0–0.5)
Eosinophils Relative: 0 %
HCT: 31.6 % — ABNORMAL LOW (ref 36.0–46.0)
Hemoglobin: 10.4 g/dL — ABNORMAL LOW (ref 12.0–15.0)
Immature Granulocytes: 1 %
Lymphocytes Relative: 3 %
Lymphs Abs: 0.4 10*3/uL — ABNORMAL LOW (ref 0.7–4.0)
MCH: 27.4 pg (ref 26.0–34.0)
MCHC: 32.9 g/dL (ref 30.0–36.0)
MCV: 83.4 fL (ref 80.0–100.0)
MONOS PCT: 10 %
Monocytes Absolute: 1.3 10*3/uL — ABNORMAL HIGH (ref 0.1–1.0)
Neutro Abs: 11.1 10*3/uL — ABNORMAL HIGH (ref 1.7–7.7)
Neutrophils Relative %: 86 %
Platelets: 165 10*3/uL (ref 150–400)
RBC: 3.79 MIL/uL — ABNORMAL LOW (ref 3.87–5.11)
RDW: 12.6 % (ref 11.5–15.5)
Smear Review: NORMAL
WBC: 12.8 10*3/uL — ABNORMAL HIGH (ref 4.0–10.5)
nRBC: 0 % (ref 0.0–0.2)

## 2018-09-01 LAB — LACTIC ACID, PLASMA
Lactic Acid, Venous: 1.1 mmol/L (ref 0.5–1.9)
Lactic Acid, Venous: 1.1 mmol/L (ref 0.5–1.9)

## 2018-09-01 LAB — URINALYSIS, COMPLETE (UACMP) WITH MICROSCOPIC
Bilirubin Urine: NEGATIVE
Glucose, UA: NEGATIVE mg/dL
Ketones, ur: 5 mg/dL — AB
Nitrite: NEGATIVE
Protein, ur: 100 mg/dL — AB
Specific Gravity, Urine: 1.024 (ref 1.005–1.030)
pH: 5 (ref 5.0–8.0)

## 2018-09-01 LAB — COMPREHENSIVE METABOLIC PANEL
ALT: 26 U/L (ref 0–44)
AST: 25 U/L (ref 15–41)
Albumin: 3 g/dL — ABNORMAL LOW (ref 3.5–5.0)
Alkaline Phosphatase: 63 U/L (ref 38–126)
Anion gap: 10 (ref 5–15)
BUN: 7 mg/dL (ref 6–20)
CHLORIDE: 102 mmol/L (ref 98–111)
CO2: 21 mmol/L — ABNORMAL LOW (ref 22–32)
Calcium: 7.9 mg/dL — ABNORMAL LOW (ref 8.9–10.3)
Creatinine, Ser: 0.65 mg/dL (ref 0.44–1.00)
GFR calc Af Amer: >60 mL/min (ref >60)
GFR calc non Af Amer: >60 mL/min (ref >60)
Glucose, Bld: 92 mg/dL (ref 70–99)
Potassium: 2.9 mmol/L — ABNORMAL LOW (ref 3.5–5.1)
Sodium: 133 mmol/L — ABNORMAL LOW (ref 135–145)
Total Bilirubin: 0.5 mg/dL (ref 0.3–1.2)
Total Protein: 7.2 g/dL (ref 6.5–8.1)

## 2018-09-01 LAB — INFLUENZA PANEL BY PCR (TYPE A & B)
Influenza A By PCR: NEGATIVE
Influenza B By PCR: NEGATIVE

## 2018-09-01 LAB — GROUP A STREP BY PCR: Group A Strep by PCR: NOT DETECTED

## 2018-09-01 MED ORDER — ACETAMINOPHEN 500 MG PO TABS
ORAL_TABLET | ORAL | Status: AC
  Filled 2018-09-01: qty 2

## 2018-09-01 MED ORDER — AMOXICILLIN 500 MG PO CAPS
500.0000 mg | ORAL_CAPSULE | Freq: Once | ORAL | Status: AC
  Administered 2018-09-01: 500 mg via ORAL
  Filled 2018-09-01: qty 1

## 2018-09-01 MED ORDER — ACETAMINOPHEN 500 MG PO TABS
1000.0000 mg | ORAL_TABLET | Freq: Once | ORAL | Status: AC
  Administered 2018-09-01: 1000 mg via ORAL

## 2018-09-01 MED ORDER — AMOXICILLIN 500 MG PO TABS: 500.0000 mg | ORAL_TABLET | Freq: Three times a day (TID) | ORAL | 0 refills | Status: DC

## 2018-09-01 MED ORDER — ACETAMINOPHEN 500 MG PO TABS
1000.0000 mg | ORAL_TABLET | Freq: Once | ORAL | Status: AC
  Administered 2018-09-01: 1000 mg via ORAL
  Filled 2018-09-01: qty 2

## 2018-09-01 MED ORDER — SODIUM CHLORIDE 0.9 % IV BOLUS
1000.0000 mL | Freq: Once | INTRAVENOUS | Status: AC
  Administered 2018-09-01: 1000 mL via INTRAVENOUS

## 2018-09-01 MED ORDER — AZITHROMYCIN 500 MG PO TABS
500.0000 mg | ORAL_TABLET | Freq: Once | ORAL | Status: AC
  Administered 2018-09-01: 500 mg via ORAL
  Filled 2018-09-01: qty 1

## 2018-09-01 MED ORDER — AZITHROMYCIN 250 MG PO TABS: ORAL_TABLET | ORAL | 0 refills | Status: DC

## 2018-09-01 NOTE — ED Notes (Addendum)
Pt c/o fever and headache since 0400 today. Pt states she took nothing prior to arrival. Pt denies cough, sore throat. Pt c/o generalized body aches, states she just 'got over the flu'. Pt denies n/v but states she sometimes feels dizzy.

## 2018-09-01 NOTE — ED Provider Notes (Signed)
[facility] Emergency Department Provider Note  ____________________________________________  Time seen: Approximately 5:09 PM  I have reviewed the triage vital signs and the nursing notes.   HISTORY  Chief Complaint Fever   HPI Brittany Young is a 23 y.o. female who presents to the emergency department for treatment and evaluation of fever and headache since about 4 AM.  She did not take any Tylenol prior to arrival.  She also has generalized body aches and states that she was diagnosed with the flu on 08/17/2018.  She is approximately [redacted] weeks pregnant.  She states that the baby is moving and she denies any vaginal bleeding or discharge.  She does have some pain in her lower back but contributes that to the body aches associated with the fever.    G2P1   Past Medical History:  Diagnosis Date  . History of cesarean section 11/03/2016  . Hypertension    With past pregnancy  . Ovarian cyst     Patient Active Problem List   Diagnosis Date Noted  . Abnormal findings on prenatal screening   . History of parvovirus B19 infection 04/04/2018  . Maternal varicella, non-immune 04/04/2018  . History of cesarean section 11/03/2016  . History of pregnancy induced hypertension 11/03/2016    Past Surgical History:  Procedure Laterality Date  . CESAREAN SECTION N/A 09/2011  . LAPAROSCOPIC APPENDECTOMY N/A 07/23/2015   Procedure: APPENDECTOMY LAPAROSCOPIC;  Surgeon: Lattie Haw, MD;  Location: [facility] ORS;  Service: General;  Laterality: N/A;    Prior to Admission medications   Medication Sig Start Date End Date Taking? Authorizing Provider  amoxicillin (AMOXIL) 500 MG tablet Take 1 tablet (500 mg total) by mouth 3 (three) times daily. 09/01/18   Talibah Colasurdo B, FNP  azithromycin (ZITHROMAX) 250 MG tablet 1 tablet for the next 4 days. 09/01/18   Kaleth Koy, Kasandra Knudsen, FNP  Prenatal Vit-Fe Fumarate-FA (PRENATAL MULTIVITAMIN) TABS tablet Take 1 tablet by mouth daily at  12 noon.    [provider]    Allergies Patient has no known allergies.  Family History  Problem Relation Age of Onset  . GER disease Mother   . Hypertension Mother   . Cancer Maternal Grandfather   . Diabetes Maternal Grandfather   . Hypertension Maternal Grandfather     Social History Social History   Tobacco Use  . Smoking status: Former Smoker    Packs/day: 0.25    Types: Cigars  . Smokeless tobacco: Never Used  Substance Use Topics  . Alcohol use: No    Alcohol/week: 0.0 standard drinks  . Drug use: Not Currently    Types: Marijuana    Comment: last used- 02/06/2018    Review of Systems Constitutional: Positive for fever/chills.  Normal appetite. ENT: Negative for sore throat. Cardiovascular: Denies chest pain. Respiratory: Negative for shortness of breath.  Negative for cough.  Negative wheezing.  Gastrointestinal: No nausea, no vomiting.  No diarrhea.  Musculoskeletal: Positive for body aches.  Positive for low back pain. Skin: Negative for rash. Neurological: Positive for headaches ____________________________________________   PHYSICAL EXAM:  VITAL SIGNS: ED Triage Vitals  Enc Vitals Group     BP 09/01/18 1502 (!) 114/92     Pulse Rate 09/01/18 1502 (!) 123     Resp 09/01/18 1502 18     Temp 09/01/18 1502 (!) 102.9 F (39.4 C)     Temp Source 09/01/18 1502 Oral     SpO2 09/01/18 1502 99 %  Weight 09/01/18 1506 173 lb 9.6 oz (78.7 kg)     Height 09/01/18 1506 5\' 3"  (1.6 m)     Head Circumference --      Peak Flow --      Pain Score 09/01/18 1506 8     Pain Loc --      Pain Edu? --      Excl. in GC? --     Constitutional: Alert and oriented.  Well appearing and in no acute distress. Eyes: Conjunctivae are normal. Ears: Bilateral tympanic membranes are erythematous and injected without loss of light reflex. Nose: No sinus congestion noted; no rhinnorhea. Mouth/Throat: Mucous membranes are moist.  Oropharynx without erythema.  Tonsils 1+ without exudate. Uvula midline. Neck: No stridor.  Lymphatic: No cervical lymphadenopathy. Cardiovascular: Normal rate, regular rhythm. Good peripheral circulation. Respiratory: Respirations are even and unlabored.  No retractions.  Breath sounds are clear to auscultation. Gastrointestinal: Soft and gravid. Musculoskeletal: FROM x 4 extremities.  Neurologic:  Normal speech and language. Skin:  Skin is warm, dry and intact. No rash noted. Psychiatric: Mood and affect are normal. Speech and behavior are normal.  ____________________________________________   LABS (all labs ordered are listed, but only abnormal results are displayed)  Labs Reviewed  URINALYSIS, COMPLETE (UACMP) WITH MICROSCOPIC - Abnormal; Notable for the following components:      Result Value   Color, Urine AMBER (*)    APPearance HAZY (*)    Hgb urine dipstick SMALL (*)    Ketones, ur 5 (*)    Protein, ur 100 (*)    Leukocytes,Ua TRACE (*)    Bacteria, UA RARE (*)    All other components within normal limits  CBC WITH DIFFERENTIAL/PLATELET - Abnormal; Notable for the following components:   WBC 12.8 (*)    RBC 3.79 (*)    Hemoglobin 10.4 (*)    HCT 31.6 (*)    Neutro Abs 11.1 (*)    Lymphs Abs 0.4 (*)    Monocytes Absolute 1.3 (*)    Abs Immature Granulocytes 0.14 (*)    All other components within normal limits  COMPREHENSIVE METABOLIC PANEL - Abnormal; Notable for the following components:   Sodium 133 (*)    Potassium 2.9 (*)    CO2 21 (*)    Calcium 7.9 (*)    Albumin 3.0 (*)    All other components within normal limits  GROUP A STREP BY PCR  INFLUENZA PANEL BY PCR (TYPE A & B)  LACTIC ACID, PLASMA  LACTIC ACID, PLASMA   ____________________________________________  EKG  Not indicated. ____________________________________________  RADIOLOGY  Patchy airspace opacity bilaterally concerning for  pneumonia. ____________________________________________   PROCEDURES  Procedure(s) performed: None  Critical Care performed: No ____________________________________________   INITIAL IMPRESSION / ASSESSMENT AND PLAN / ED COURSE  23 y.o. female presents to the emergency department for treatment and evaluation of fever, headache, and body aches.  She states that she was recently diagnosed with the flu, but has not really been drinking much because she just has not felt well.  Labs and urinalysis are pending.   ----------------------------------------- 7:07 PM on 09/01/2018 ----------------------------------------- IV fluids are infusing.  Patient states that she feels somewhat better.  Repeat vital signs requested.  Patient is due for another dose of Tylenol for fever which is also been ordered.  Labs reviewed and are concerning for an elevated white blood cell count at 12.8 with a left shift at 11.1.  She also has sodium of 133 with a  potassium of 2.9.  Lactic acid is negative.  There is no influenza or strep on PCR testing.  Urinalysis does show a small amount of hemoglobin as well as some ketones and protein with trace leukocytes, white blood cells count of 6-10 with rare bacteria however there is 21-50 squamous epithelial cells which is likely skewing the reliability of the urinalysis.  ----------------------------------------- 7:26 PM on 09/01/2018 ----------------------------------------- Case discussed with Dr. Lenard Lance.  Due to recent influenza and negative influenza screening today as well as fever and increasing white blood cell count without specific rationale for any of the above, chest x-ray to be completed to ensure that there is not a post viral pneumonia.  After these results, plan will be to speak with Dr. Logan Bores, the patient's obstetrician who is on-call tonight.  ----------------------------------------- 8:04 PM on  09/01/2018 ----------------------------------------- Case discussed with Dr. Logan Bores who would like her to call on Monday for follow up appointment on Thursday or Friday of next week or sooner if not improving. Results of chest x-ray discussed with the patient. She will receive 1st dose of amoxicillin and azithromycin here tonight. She is to pick up Rx tomorrow and finish them as instructed. She is to return to the ER for symptoms that change or worsen if unable to schedule an appointment with OB.  Medications  azithromycin (ZITHROMAX) tablet 500 mg (has no administration in time range)  amoxicillin (AMOXIL) capsule 500 mg (has no administration in time range)  acetaminophen (TYLENOL) tablet 1,000 mg ( Oral Not Given 09/01/18 1511)  sodium chloride 0.9 % bolus 1,000 mL (0 mLs Intravenous Stopped 09/01/18 2001)  acetaminophen (TYLENOL) tablet 1,000 mg (1,000 mg Oral Given 09/01/18 2002)    ED Discharge Orders         Ordered    azithromycin (ZITHROMAX) 250 MG tablet     09/01/18 2011    amoxicillin (AMOXIL) 500 MG tablet  3 times daily     09/01/18 2011           Pertinent labs & imaging results that were available during my care of the patient were reviewed by me and considered in my medical decision making (see chart for details).    If controlled substance prescribed during this visit, 12 month history viewed on the [place] prior to issuing an initial prescription for Schedule II or III opiod. ____________________________________________   FINAL CLINICAL IMPRESSION(S) / ED DIAGNOSES  Final diagnoses:  Community acquired pneumonia, bilateral    Note:  This document was prepared using Conservation officer, historic buildings and may include unintentional dictation errors.     Chinita Pester, FNP 09/01/18 2014    Minna Antis, MD 09/01/18 2248

## 2018-09-01 NOTE — ED Triage Notes (Signed)
Diagnosed with flu on 2/13.  Returns today with persistent fever.  Last medicated with tylenol at 0400.

## 2018-09-01 NOTE — Discharge Instructions (Addendum)
Please continue tylenol every 4 hours if you have body aches or fever.  Stay well hydrated.  Return to the ER immediately for any symptoms of concern if unable to schedule an appointment.

## 2018-09-05 ENCOUNTER — Encounter: Payer: Self-pay | Admitting: Obstetrics and Gynecology

## 2018-09-05 ENCOUNTER — Ambulatory Visit (INDEPENDENT_AMBULATORY_CARE_PROVIDER_SITE_OTHER): Payer: Medicaid Other | Admitting: Obstetrics and Gynecology

## 2018-09-05 VITALS — BP 131/87 | HR 68 | Wt 178.2 lb

## 2018-09-05 DIAGNOSIS — Z3483 Encounter for supervision of other normal pregnancy, third trimester: Secondary | ICD-10-CM

## 2018-09-05 DIAGNOSIS — J181 Lobar pneumonia, unspecified organism: Secondary | ICD-10-CM

## 2018-09-05 DIAGNOSIS — J189 Pneumonia, unspecified organism: Secondary | ICD-10-CM

## 2018-09-05 LAB — POCT URINALYSIS DIPSTICK OB
Bilirubin, UA: NEGATIVE
Blood, UA: NEGATIVE
Glucose, UA: NEGATIVE
Ketones, UA: NEGATIVE
LEUKOCYTES UA: NEGATIVE
Nitrite, UA: NEGATIVE
POC,PROTEIN,UA: NEGATIVE
Spec Grav, UA: 1.01 (ref 1.010–1.025)
Urobilinogen, UA: 0.2 E.U./dL
pH, UA: 6.5 (ref 5.0–8.0)

## 2018-09-05 NOTE — Progress Notes (Signed)
ROB: Patient doing well today.  Was seen in the emergency department diagnosed with bilateral lower lobe pneumonia.  She is currently taking antibiotics and feels much better.

## 2018-09-05 NOTE — Progress Notes (Signed)
Patient comes in today for ED follow up for pneumonia.

## 2018-09-13 ENCOUNTER — Encounter: Payer: Medicaid Other | Admitting: Obstetrics and Gynecology

## 2018-09-19 ENCOUNTER — Other Ambulatory Visit (HOSPITAL_COMMUNITY)
Admission: RE | Admit: 2018-09-19 | Discharge: 2018-09-19 | Disposition: A | Discharge status: Home / Self Care | Payer: Medicaid Other | Source: Ambulatory Visit | Attending: Obstetrics and Gynecology | Admitting: Obstetrics and Gynecology

## 2018-09-19 ENCOUNTER — Other Ambulatory Visit: Payer: Self-pay

## 2018-09-19 ENCOUNTER — Ambulatory Visit (INDEPENDENT_AMBULATORY_CARE_PROVIDER_SITE_OTHER): Payer: Medicaid Other | Admitting: Obstetrics and Gynecology

## 2018-09-19 ENCOUNTER — Encounter: Payer: Self-pay | Admitting: Obstetrics and Gynecology

## 2018-09-19 DIAGNOSIS — Z3483 Encounter for supervision of other normal pregnancy, third trimester: Secondary | ICD-10-CM | POA: Diagnosis present

## 2018-09-19 LAB — POCT URINALYSIS DIPSTICK OB
BILIRUBIN UA: NEGATIVE
Blood, UA: NEGATIVE
Glucose, UA: NEGATIVE
Ketones, UA: NEGATIVE
Leukocytes, UA: NEGATIVE
Nitrite, UA: NEGATIVE
Spec Grav, UA: 1.025 (ref 1.010–1.025)
Urobilinogen, UA: 0.2 E.U./dL
pH, UA: 6 (ref 5.0–8.0)

## 2018-09-19 NOTE — Progress Notes (Signed)
ROB: Patient doing ok. No major issues. Discussed scheduling C-section. She and partner were unsure of desired date last visit, however have now decided on 4//20 (patient's due date). Discussed at length regarding possibility of spontaneous labor prior to C-section date.  Patient notes understanding.  36 week cultures done today. Pap also done today. Reviewed MFM note. Patient will need cord blood collected and submitted for chromosomal analysis by Peds (to notify at time of admission).  RTC in 2 weeks.

## 2018-09-19 NOTE — Patient Instructions (Signed)

## 2018-09-19 NOTE — Progress Notes (Signed)
ROB-pt present today for OB prenatal care. Pt had her 36 week cultures done today. Pt had her tdap completed today.

## 2018-09-20 ENCOUNTER — Observation Stay
Admission: EM | Admit: 2018-09-20 | Discharge: 2018-09-21 | Disposition: A | Discharge status: Home / Self Care | Payer: Medicaid Other | Attending: Obstetrics and Gynecology | Admitting: Obstetrics and Gynecology

## 2018-09-20 ENCOUNTER — Other Ambulatory Visit: Payer: Self-pay

## 2018-09-20 ENCOUNTER — Inpatient Hospital Stay: Payer: Medicaid Other

## 2018-09-20 ENCOUNTER — Encounter: Payer: Self-pay | Admitting: *Deleted

## 2018-09-20 DIAGNOSIS — O4693 Antepartum hemorrhage, unspecified, third trimester: Secondary | ICD-10-CM | POA: Diagnosis not present

## 2018-09-20 DIAGNOSIS — Z3A35 35 weeks gestation of pregnancy: Secondary | ICD-10-CM | POA: Insufficient documentation

## 2018-09-20 DIAGNOSIS — R58 Hemorrhage, not elsewhere classified: Secondary | ICD-10-CM

## 2018-09-20 LAB — CBC
HEMATOCRIT: 29.5 % — AB (ref 36.0–46.0)
Hemoglobin: 9.7 g/dL — ABNORMAL LOW (ref 12.0–15.0)
MCH: 27.7 pg (ref 26.0–34.0)
MCHC: 32.9 g/dL (ref 30.0–36.0)
MCV: 84.3 fL (ref 80.0–100.0)
Platelets: 174 10*3/uL (ref 150–400)
RBC: 3.5 MIL/uL — ABNORMAL LOW (ref 3.87–5.11)
RDW: 13.3 % (ref 11.5–15.5)
WBC: 7.5 10*3/uL (ref 4.0–10.5)
nRBC: 0 % (ref 0.0–0.2)

## 2018-09-20 LAB — PROTEIN / CREATININE RATIO, URINE
Creatinine, Urine: 86 mg/dL
Protein Creatinine Ratio: 0.17 mg/mg{Cre} — ABNORMAL HIGH (ref 0.00–0.15)
Total Protein, Urine: 15 mg/dL

## 2018-09-20 MED ORDER — SODIUM CHLORIDE 0.9% FLUSH: 3.0000 mL | INTRAVENOUS | Status: DC | PRN

## 2018-09-20 MED ORDER — SODIUM CHLORIDE 0.9 % IV SOLN: 250.0000 mL | INTRAVENOUS | Status: DC | PRN

## 2018-09-20 MED ORDER — SODIUM CHLORIDE 0.9% FLUSH: 3.0000 mL | Freq: Two times a day (BID) | INTRAVENOUS | Status: DC

## 2018-09-20 NOTE — Progress Notes (Signed)
Dr Logan Bores notified of pt arrival and complaint. MD aware of bleeding and elevated BP. MD aware of FHR tracing and CTX pattern. Orders received for labs, ROM plus (if not contraindicated) and continue to monitor bleeding, FHR, CTX and BP.

## 2018-09-20 NOTE — OB Triage Note (Signed)
Pt arrived from home stating she was walking up stairs and felt a "gush" of fluid. She reported going to the bathroom and seeing blood. Pt denies any contractions. Pt reports an occasional headache but none at this time. Pt reports decreased fetal movement.

## 2018-09-21 DIAGNOSIS — O4693 Antepartum hemorrhage, unspecified, third trimester: Secondary | ICD-10-CM

## 2018-09-21 DIAGNOSIS — Z3A35 35 weeks gestation of pregnancy: Secondary | ICD-10-CM | POA: Diagnosis not present

## 2018-09-21 LAB — CYTOLOGY - PAP: Diagnosis: NEGATIVE

## 2018-09-21 NOTE — Discharge Summary (Signed)
    L&D OB Triage Note  SUBJECTIVE Brittany Young is a 23 y.o. G2P1001 female at [redacted]w[redacted]d, EDD Estimated Date of Delivery: 10/23/18 who presented to triage with complaints of sudden onset of vaginal bleeding.  She denies recent intercourse.  She denies leakage of fluid.  She denies contractions.  After spending the night in the hospital under observation patient desires discharge this morning.  Although a few contractions are noted on the monitor she does not feel these.  OB History  Gravida Para Term Preterm AB Living  2 1 1  0 0 1  SAB TAB Ectopic Multiple Live Births  0 0 0 0 1    # Outcome Date GA Lbr Len/2nd Weight Sex Delivery Anes PTL Lv  2 Current           1 Term 2013 [redacted]w[redacted]d  3062 g M CS-LTranv   LIV     Complications: Cephalopelvic Disproportion    Obstetric Comments  G1- C-section for CPD.  Had GHTN, was induced.     Facility-Administered Medications Prior to Admission  Medication Dose Route Frequency Provider Last Rate Last Dose  . Tdap (BOOSTRIX) injection 0.5 mL  0.5 mL Intramuscular Once Hildred Laser, MD       Medications Prior to Admission  Medication Sig Dispense Refill Last Dose  . Prenatal Vit-Fe Fumarate-FA (PRENATAL MULTIVITAMIN) TABS tablet Take 1 tablet by mouth daily at 12 noon.   09/20/2018 at Unknown time     OBJECTIVE  Nursing Evaluation:   BP 134/90 (BP Location: Left Arm)   Pulse 73   Resp 18   Ht 5\' 3"  (1.6 m)   Wt 77.1 kg   LMP 01/14/2018   BMI 30.11 kg/m    Findings:   Patient found to have some vaginal bleeding.     No cervical change throughout her stay     Had moderate hypertension (no treatment required )but ruled out for preeclampsia     Underwent an ultrasound which revealed no evidence of placental abruption and normal amniotic fluid volume.  NST was performed and has been reviewed by me.  I have reviewed the NSTs from 3/18 and 3/19.  I personally saw and took care of this patient.  NST INTERPRETATION: Category I  Mode: External  Baseline Rate (A): 145 bpm Variability: Moderate Accelerations: 10 x 10      15 x 15 Decelerations: None     Contraction Frequency (min): 5-7  ASSESSMENT Impression:  1.  Pregnancy:  G2P1001 at [redacted]w[redacted]d , EDD Estimated Date of Delivery: 10/23/18 2.  NST:  Category I  PLAN 1. Reassurance given 2. Discharge home with standard labor precautions given to return to L&D or call the office for problems. 3. Patient to return to labor and delivery for any additional bleeding. 4.  Prior to her discharge we discussed her home rest, signs and symptoms of concern including possible rupture of membranes additional bleeding and contractions.  I have asked her to come to our office in the next few days for close follow-up.  Signs and symptoms of preeclampsia discussed and if she develops any of these she should contact us immediately.

## 2018-09-22 LAB — GC/CHLAMYDIA PROBE AMP
Chlamydia trachomatis, NAA: NEGATIVE
Neisseria gonorrhoeae by PCR: NEGATIVE

## 2018-09-22 LAB — STREP GP B NAA: Strep Gp B NAA: POSITIVE — AB

## 2018-09-25 ENCOUNTER — Telehealth: Payer: Self-pay

## 2018-09-25 NOTE — Telephone Encounter (Signed)
vi 

## 2018-09-25 NOTE — Telephone Encounter (Signed)
Pt called to go over pre-office screening. No answer and unable to leave a voicemail due to the mail box being full.

## 2018-09-26 ENCOUNTER — Ambulatory Visit (INDEPENDENT_AMBULATORY_CARE_PROVIDER_SITE_OTHER): Payer: Medicaid Other | Admitting: Obstetrics and Gynecology

## 2018-09-26 ENCOUNTER — Other Ambulatory Visit: Payer: Self-pay

## 2018-09-26 ENCOUNTER — Encounter: Payer: Self-pay | Admitting: Obstetrics and Gynecology

## 2018-09-26 ENCOUNTER — Encounter: Payer: Medicaid Other | Admitting: Obstetrics and Gynecology

## 2018-09-26 VITALS — BP 131/88 | HR 83 | Wt 180.2 lb

## 2018-09-26 DIAGNOSIS — Z3483 Encounter for supervision of other normal pregnancy, third trimester: Secondary | ICD-10-CM

## 2018-09-26 DIAGNOSIS — O34219 Maternal care for unspecified type scar from previous cesarean delivery: Secondary | ICD-10-CM

## 2018-09-26 DIAGNOSIS — O4693 Antepartum hemorrhage, unspecified, third trimester: Secondary | ICD-10-CM

## 2018-09-26 LAB — POCT URINALYSIS DIPSTICK OB
Bilirubin, UA: NEGATIVE
Glucose, UA: NEGATIVE
Ketones, UA: NEGATIVE
Leukocytes, UA: NEGATIVE
Nitrite, UA: NEGATIVE
Spec Grav, UA: 1.025 (ref 1.010–1.025)
Urobilinogen, UA: 0.2 E.U./dL
pH, UA: 6 (ref 5.0–8.0)

## 2018-09-26 NOTE — Progress Notes (Signed)
ROB-Pt present today for prenatal care. Pt stated that she was doing well. Pt stated Fetal movement present. Pt stated that she went to the hospital due to having vaginal bleeding. Pt stated that she was having some contractions or light vaginal bleeding. No other concerns.

## 2018-09-26 NOTE — Addendum Note (Signed)
Addended by: Silvano Bilis on: 09/26/2018 10:53 AM   Modules accepted: Orders, SmartSet

## 2018-09-26 NOTE — Progress Notes (Signed)
Problem OB: Patient f/u from ER visit 5 days ago, due to heavy vaginal bleeding in pregnancy.  Today notes she is doing fine, denies any further episodes of bleeding. Notes good fetal movement, some uterine irritability. GBS positive. Discussed needs for antibiotics in labor. RTC in 2 weeks. Labor precautions given.

## 2018-09-26 NOTE — Patient Instructions (Signed)

## 2018-09-27 ENCOUNTER — Inpatient Hospital Stay
Admission: EM | Admit: 2018-09-27 | Discharge: 2018-09-30 | DRG: 786 | Disposition: A | Discharge status: Home / Self Care | Payer: Medicaid Other | Attending: Obstetrics and Gynecology | Admitting: Obstetrics and Gynecology

## 2018-09-27 ENCOUNTER — Telehealth: Payer: Self-pay | Admitting: Obstetrics and Gynecology

## 2018-09-27 ENCOUNTER — Other Ambulatory Visit: Payer: Self-pay

## 2018-09-27 ENCOUNTER — Encounter
Admission: EM | Disposition: A | Discharge status: Home / Self Care | Payer: Self-pay | Source: Home / Self Care | Attending: Obstetrics and Gynecology

## 2018-09-27 DIAGNOSIS — Z3A36 36 weeks gestation of pregnancy: Secondary | ICD-10-CM

## 2018-09-27 DIAGNOSIS — O9902 Anemia complicating childbirth: Secondary | ICD-10-CM | POA: Diagnosis present

## 2018-09-27 DIAGNOSIS — Z283 Underimmunization status: Secondary | ICD-10-CM

## 2018-09-27 DIAGNOSIS — O368390 Maternal care for abnormalities of the fetal heart rate or rhythm, unspecified trimester, not applicable or unspecified: Secondary | ICD-10-CM

## 2018-09-27 DIAGNOSIS — O34219 Maternal care for unspecified type scar from previous cesarean delivery: Secondary | ICD-10-CM | POA: Diagnosis present

## 2018-09-27 DIAGNOSIS — O99824 Streptococcus B carrier state complicating childbirth: Secondary | ICD-10-CM | POA: Diagnosis present

## 2018-09-27 DIAGNOSIS — O99214 Obesity complicating childbirth: Secondary | ICD-10-CM | POA: Diagnosis present

## 2018-09-27 DIAGNOSIS — O09899 Supervision of other high risk pregnancies, unspecified trimester: Secondary | ICD-10-CM

## 2018-09-27 DIAGNOSIS — D649 Anemia, unspecified: Secondary | ICD-10-CM | POA: Diagnosis present

## 2018-09-27 DIAGNOSIS — O34211 Maternal care for low transverse scar from previous cesarean delivery: Principal | ICD-10-CM | POA: Diagnosis present

## 2018-09-27 DIAGNOSIS — Z87891 Personal history of nicotine dependence: Secondary | ICD-10-CM | POA: Diagnosis not present

## 2018-09-27 DIAGNOSIS — O358XX0 Maternal care for other (suspected) fetal abnormality and damage, not applicable or unspecified: Secondary | ICD-10-CM | POA: Diagnosis present

## 2018-09-27 DIAGNOSIS — E669 Obesity, unspecified: Secondary | ICD-10-CM | POA: Diagnosis present

## 2018-09-27 DIAGNOSIS — Z8619 Personal history of other infectious and parasitic diseases: Secondary | ICD-10-CM

## 2018-09-27 DIAGNOSIS — O289 Unspecified abnormal findings on antenatal screening of mother: Secondary | ICD-10-CM | POA: Diagnosis present

## 2018-09-27 DIAGNOSIS — Z8759 Personal history of other complications of pregnancy, childbirth and the puerperium: Secondary | ICD-10-CM

## 2018-09-27 LAB — DRUG PROFILE, UR, 9 DRUGS (LABCORP)
Amphetamines, Urine: NEGATIVE ng/mL
Barbiturate Quant, Ur: NEGATIVE ng/mL
Benzodiazepine Quant, Ur: NEGATIVE ng/mL
Cannabinoid Quant, Ur: NEGATIVE ng/mL
Cocaine (Metab.): NEGATIVE ng/mL
Methadone Screen, Urine: NEGATIVE ng/mL
Opiate Quant, Ur: NEGATIVE ng/mL
PCP Quant, Ur: NEGATIVE ng/mL
Propoxyphene: NEGATIVE ng/mL

## 2018-09-27 LAB — CBC
HCT: 32 % — ABNORMAL LOW (ref 36.0–46.0)
Hemoglobin: 10.1 g/dL — ABNORMAL LOW (ref 12.0–15.0)
MCH: 27.2 pg (ref 26.0–34.0)
MCHC: 31.6 g/dL (ref 30.0–36.0)
MCV: 86 fL (ref 80.0–100.0)
Platelets: 188 10*3/uL (ref 150–400)
RBC: 3.72 MIL/uL — ABNORMAL LOW (ref 3.87–5.11)
RDW: 13.4 % (ref 11.5–15.5)
WBC: 11.2 10*3/uL — ABNORMAL HIGH (ref 4.0–10.5)
nRBC: 0 % (ref 0.0–0.2)

## 2018-09-27 LAB — TYPE AND SCREEN
ABO/RH(D): O POS
Antibody Screen: NEGATIVE

## 2018-09-27 LAB — RAPID HIV SCREEN (HIV 1/2 AB+AG)
HIV 1/2 Antibodies: NONREACTIVE
HIV-1 P24 Antigen - HIV24: NONREACTIVE

## 2018-09-27 LAB — RUPTURE OF MEMBRANE (ROM)PLUS: Rom Plus: POSITIVE

## 2018-09-27 SURGERY — Surgical Case
Anesthesia: Spinal | Site: Abdomen

## 2018-09-27 MED ORDER — TERBUTALINE SULFATE 1 MG/ML IJ SOLN
INTRAMUSCULAR | Status: AC
  Filled 2018-09-27: qty 1

## 2018-09-27 MED ORDER — CEFAZOLIN SODIUM-DEXTROSE 2-4 GM/100ML-% IV SOLN
2.0000 g | INTRAVENOUS | Status: AC
  Administered 2018-09-28: 2 g via INTRAVENOUS
  Filled 2018-09-27 (×2): qty 100

## 2018-09-27 MED ORDER — SOD CITRATE-CITRIC ACID 500-334 MG/5ML PO SOLN
30.0000 mL | ORAL | Status: AC
  Administered 2018-09-27: 30 mL via ORAL

## 2018-09-27 MED ORDER — MORPHINE SULFATE (PF) 0.5 MG/ML IJ SOLN
INTRAMUSCULAR | Status: AC
  Filled 2018-09-27: qty 10

## 2018-09-27 MED ORDER — ACETAMINOPHEN 500 MG PO TABS
ORAL_TABLET | ORAL | Status: AC
  Filled 2018-09-27: qty 2

## 2018-09-27 MED ORDER — OXYTOCIN 40 UNITS IN NORMAL SALINE INFUSION - SIMPLE MED
INTRAVENOUS | Status: AC
  Filled 2018-09-27: qty 1000

## 2018-09-27 MED ORDER — LACTATED RINGERS IV BOLUS
1000.0000 mL | Freq: Once | INTRAVENOUS | Status: AC
  Administered 2018-09-27: 1000 mL via INTRAVENOUS

## 2018-09-27 MED ORDER — TERBUTALINE SULFATE 1 MG/ML IJ SOLN
0.2500 mg | Freq: Once | INTRAMUSCULAR | Status: AC | PRN
  Administered 2018-09-27 (×2): 0.25 mg via SUBCUTANEOUS
  Filled 2018-09-27: qty 1

## 2018-09-27 MED ORDER — FENTANYL CITRATE (PF) 100 MCG/2ML IJ SOLN
INTRAMUSCULAR | Status: AC
  Filled 2018-09-27: qty 2

## 2018-09-27 MED ORDER — SOD CITRATE-CITRIC ACID 500-334 MG/5ML PO SOLN
ORAL | Status: AC
  Filled 2018-09-27: qty 15

## 2018-09-27 MED ORDER — ACETAMINOPHEN 500 MG PO TABS
1000.0000 mg | ORAL_TABLET | ORAL | Status: AC
  Administered 2018-09-27: 1000 mg via ORAL

## 2018-09-27 MED ORDER — LACTATED RINGERS IV SOLN
INTRAVENOUS | Status: DC
  Administered 2018-09-27: 21:00:00 via INTRAVENOUS

## 2018-09-27 MED ORDER — LIDOCAINE 5 % EX PTCH
MEDICATED_PATCH | CUTANEOUS | Status: AC
  Administered 2018-09-28: 04:00:00
  Filled 2018-09-27: qty 1

## 2018-09-27 SURGICAL SUPPLY — 27 items
BAG COUNTER SPONGE EZ (MISCELLANEOUS) ×4 IMPLANT
CANISTER SUCT 3000ML PPV (MISCELLANEOUS) ×3 IMPLANT
CHLORAPREP W/TINT 26 (MISCELLANEOUS) ×6 IMPLANT
COUNTER SPONGE BAG EZ (MISCELLANEOUS) ×2
COVER WAND RF STERILE (DRAPES) IMPLANT
DERMABOND ADVANCED (GAUZE/BANDAGES/DRESSINGS) ×2
DERMABOND ADVANCED .7 DNX12 (GAUZE/BANDAGES/DRESSINGS) ×1 IMPLANT
DRSG TELFA 3X8 NADH (GAUZE/BANDAGES/DRESSINGS) IMPLANT
ELECT REM PT RETURN 9FT ADLT (ELECTROSURGICAL) ×3
ELECTRODE REM PT RTRN 9FT ADLT (ELECTROSURGICAL) ×1 IMPLANT
GAUZE SPONGE 4X4 12PLY STRL (GAUZE/BANDAGES/DRESSINGS) IMPLANT
GLOVE BIO SURGEON STRL SZ 6.5 (GLOVE) ×8 IMPLANT
GLOVE BIO SURGEONS STRL SZ 6.5 (GLOVE) ×4
GLOVE INDICATOR 7.0 STRL GRN (GLOVE) ×9 IMPLANT
GOWN STRL REUS W/ TWL LRG LVL3 (GOWN DISPOSABLE) ×3 IMPLANT
GOWN STRL REUS W/TWL LRG LVL3 (GOWN DISPOSABLE) ×6
KIT TURNOVER KIT A (KITS) ×3 IMPLANT
NS IRRIG 1000ML POUR BTL (IV SOLUTION) ×3 IMPLANT
PACK C SECTION AR (MISCELLANEOUS) ×3 IMPLANT
PAD OB MATERNITY 4.3X12.25 (PERSONAL CARE ITEMS) ×6 IMPLANT
PAD PREP 24X41 OB/GYN DISP (PERSONAL CARE ITEMS) ×3 IMPLANT
RTRCTR C-SECT PINK 25CM LRG (MISCELLANEOUS) ×3 IMPLANT
SUT MNCRL AB 4-0 PS2 18 (SUTURE) ×6 IMPLANT
SUT PLAIN 2 0 XLH (SUTURE) IMPLANT
SUT VIC AB 0 CT1 36 (SUTURE) ×9 IMPLANT
SUT VIC AB 3-0 SH 27 (SUTURE) ×2
SUT VIC AB 3-0 SH 27X BRD (SUTURE) ×1 IMPLANT

## 2018-09-27 NOTE — Telephone Encounter (Signed)
Spoke with Dr. Logan Bores and informed him of all the information. He stated that he does not believe she is in true labor and should rest. She should keep a check on contractions. I spoke with patient and let her know what Dr. Logan Bores had said. She states that for the last hour her contractions have been about 5 minutes apart and lasting 1 minute. After speaking with Dr. Logan Bores again he stated that he still does not think it is true labor. He said that if she feels like she should be checked that she could go to L&D, but they are not letting anyone in with her until she is in true labor. I let the patient know what Dr. Logan Bores said. I did let the patient know that we would be here until 5 if she needs anything or had anymore questions.

## 2018-09-27 NOTE — Telephone Encounter (Signed)
The patient called and states she lost her mucus plug a few minutes ago.  She states she is contracting but she has no idea how far apart as she stated she is not timing them yet because they just started.  She is having no bleeding, no leaking of fluid.  She wanted to speak to [facility] to know what to do.  The patient was okay with a call back, no one in house to speak with the patient, please advise, thanks.

## 2018-09-27 NOTE — H&P (Addendum)
Obstetric History and Physical  Brittany Young is a 23 y.o. G2P1001 with IUP at [redacted]w[redacted]d presenting for complaints of contractions ongoing for most of the day today. Patient states she has been having regular, every 5 minutes contractions, minimal vaginal bleeding, possible leaking fluid, with active fetal movement.    Prenatal Course Source of Care: [facility] with onset of care at 11 weeks Pregnancy complications or risks: Patient Active Problem List   Diagnosis Date Noted  . History of cesarean delivery affecting pregnancy 09/27/2018  . Indication for care in labor and delivery, antepartum 09/21/2018  . Abnormal findings on prenatal screening   . History of parvovirus B19 infection 04/04/2018  . Maternal varicella, non-immune 04/04/2018  . History of cesarean section 11/03/2016  . History of pregnancy induced hypertension 11/03/2016   She plans to breast and bottle feed. She desires possibly Mirena IUD for postpartum contraception.   Prenatal labs and studies: ABO, Rh: O/Positive/-- (09/27 1435) Antibody: Negative (10/29 1015) Rubella: 1.06 (09/27 1435) RPR: Non Reactive (01/21 1027)  HBsAg: Negative (09/27 1435)  HIV: Non Reactive (09/27 1435)  QBH:ALPFXTKW (03/18 1046) 1 hr Glucola  normal Genetic screening abnormal with suspected Turner's syndrome by cell-free DNA testing, declined amniocentesis Anatomy US normal    OB History  Gravida Para Term Preterm AB Living  2 1 1     1   SAB TAB Ectopic Multiple Live Births          1    # Outcome Date GA Lbr Len/2nd Weight Sex Delivery Anes PTL Lv  2 Current           1 Term 2013 [redacted]w[redacted]d  3062 g M CS-LTranv   LIV     Complications: Cephalopelvic Disproportion    Obstetric Comments  G1- C-section for CPD.  Had GHTN, was induced.     Past Medical History:  Diagnosis Date  . History of cesarean section 11/03/2016  . Hypertension    With past pregnancy  . Ovarian cyst     Past Surgical History:  Procedure  Laterality Date  . APPENDECTOMY    . CESAREAN SECTION N/A 09/2011  . LAPAROSCOPIC APPENDECTOMY N/A 07/23/2015   Procedure: APPENDECTOMY LAPAROSCOPIC;  Surgeon: Lattie Haw, MD;  Location: [facility] ORS;  Service: General;  Laterality: N/A;    Social History   Socioeconomic History  . Marital status: Single    Spouse name: Janey Greaser  . Number of children: Not on file  . Years of education: Not on file  . Highest education level: Not on file  Occupational History  . Not on file  Social Needs  . Financial resource strain: Not on file  . Food insecurity:    Worry: Not on file    Inability: Not on file  . Transportation needs:    Medical: Not on file    Non-medical: Not on file  Tobacco Use  . Smoking status: Former Smoker    Packs/day: 0.25    Types: Cigars  . Smokeless tobacco: Never Used  Substance and Sexual Activity  . Alcohol use: No    Alcohol/week: 0.0 standard drinks  . Drug use: Not Currently    Types: Marijuana    Comment: last used- 02/06/2018  . Sexual activity: Not Currently    Birth control/protection: None  Lifestyle  . Physical activity:    Days per week: Not on file    Minutes per session: Not on file  . Stress: Not on file  Relationships  . Social  connections:    Talks on phone: Not on file    Gets together: Not on file    Attends religious service: Not on file    Active member of club or organization: Not on file    Attends meetings of clubs or organizations: Not on file    Relationship status: Not on file  Other Topics Concern  . Not on file  Social History Narrative  . Not on file    Family History  Problem Relation Age of Onset  . GER disease Mother   . Hypertension Mother   . Cancer Maternal Grandfather   . Diabetes Maternal Grandfather   . Hypertension Maternal Grandfather     Medications Prior to Admission  Medication Sig Dispense Refill Last Dose  . Prenatal Vit-Fe Fumarate-FA (PRENATAL MULTIVITAMIN) TABS tablet Take 1 tablet by  mouth daily at 12 noon.   09/27/2018 at Unknown time    No Known Allergies  Review of Systems: Negative except for what is mentioned in HPI.  Physical Exam: BP 129/66 (BP Location: Right Arm)   Pulse (!) 116   Temp 98.8 F (37.1 C) (Oral)   Resp 16   Ht 5\' 3"  (1.6 m)   Wt 81.6 kg   LMP 01/14/2018   SpO2 98%   BMI 31.89 kg/m  CONSTITUTIONAL: Well-developed, well-nourished female in mild distress with contractions. HENT:  Normocephalic, atraumatic, External right and left ear normal. Oropharynx is clear and moist EYES: Conjunctivae and EOM are normal. Pupils are equal, round, and reactive to light. No scleral icterus.  NECK: Normal range of motion, supple, no masses SKIN: Skin is warm and dry. No rash noted. Not diaphoretic. No erythema. No pallor. NEUROLOGIC: Alert and oriented to person, place, and time. Normal reflexes, muscle tone coordination. No cranial nerve deficit noted. PSYCHIATRIC: Normal mood and affect. Normal behavior. Normal judgment and thought content. CARDIOVASCULAR: Normal heart rate noted, regular rhythm RESPIRATORY: Effort and breath sounds normal, no problems with respiration noted ABDOMEN: Soft, nontender, nondistended, gravid. MUSCULOSKELETAL: Normal range of motion. No edema and no tenderness. 2+ distal pulses.  Cervical Exam: Dilatation 4 cm (changed from 3 cm 2 hrs ago)  Effacement 80%   Station  -2.  Ferning attempted by nurse but inconclusive due to presence of blood.  Speculum exam notes no fluid, but blood mucus present.  Presentation: cephalic FHT:  Baseline rate 140 bpm   Variability minimal, with intermittent periods of moderate variability. Accelerations absent   Decelerations occasional shallow variable decelerations.  Contractions: Every 3-5 mins   Pertinent Labs/Studies:   Results for orders placed or performed during the hospital encounter of 09/27/18 (from the past 24 hour(s))  ROM Plus ([facility] only)     Status: None   Collection Time:  09/27/18  6:53 PM  Result Value Ref Range   Rom Plus POSITIVE   CBC     Status: Abnormal   Collection Time: 09/27/18  9:17 PM  Result Value Ref Range   WBC 11.2 (H) 4.0 - 10.5 K/uL   RBC 3.72 (L) 3.87 - 5.11 MIL/uL   Hemoglobin 10.1 (L) 12.0 - 15.0 g/dL   HCT 37.1 (L) 69.6 - 78.9 %   MCV 86.0 80.0 - 100.0 fL   MCH 27.2 26.0 - 34.0 pg   MCHC 31.6 30.0 - 36.0 g/dL   RDW 38.1 01.7 - 51.0 %   Platelets 188 150 - 400 K/uL   nRBC 0.0 0.0 - 0.2 %  Rapid HIV screen (HIV 1/2 Ab+Ag)  Status: None   Collection Time: 09/27/18  9:17 PM  Result Value Ref Range   HIV-1 P24 Antigen - HIV24 NON REACTIVE NON REACTIVE   HIV 1/2 Antibodies NON REACTIVE NON REACTIVE   Interpretation (HIV Ag Ab)      A non reactive test result means that HIV 1 or HIV 2 antibodies and HIV 1 p24 antigen were not detected in the specimen.  Type and screen     Status: None   Collection Time: 09/27/18  9:17 PM  Result Value Ref Range   ABO/RH(D) O POS    Antibody Screen NEG    Sample Expiration      09/30/2018 Performed at [facility], [address] [place] [place], [place] [address]     Assessment : Brittany Young is a 23 y.o. G2P1001 at [redacted]w[redacted]d being admitted for preterm labor, prior h/o C-section x 1 declining TOLAC, questionable ruptured membranes. Mild anemia.  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  Plan: Labor: Attempted to stall labor with IVF bolus, terbutaline (has received 1 dose so far). ROM Plus positive, however possible contamination if blood present making it less reliable. Patient declines TOLAC, will plan for C-section if labor cannot be called. Notified OR, who notes that we are currently listed as third case unless case changes to emergent status (instead of urgent).  FWB: Category II fetal tracing (no accels, periods of minimal to moderate variability, occasional variable decelerations). Continue IVF. Left lateral tilt.  Has been given apple  juice and vibroacoustic stimulator used. GBS positive.  Will be given antibiotics for c-section. Will notify Peds of positive genetic testing, need for chromosomal analysis after delivery.  Delivery plan: The risks of cesarean section discussed with the patient included but were not limited to: bleeding which may require transfusion or reoperation; infection which may require antibiotics; injury to bowel, bladder, ureters or other surrounding organs; injury to the fetus; need for additional procedures including hysterectomy in the event of a life-threatening hemorrhage; placental abnormalities wth subsequent pregnancies, incisional problems, thromboembolic phenomenon and other postoperative/anesthesia complications. The patient concurred with the proposed plan, giving informed written consent for the procedure.   She will remain NPO for procedure. Anesthesia and OR aware. Preoperative prophylactic antibiotics and SCDs ordered on call to the OR.  To OR when ready.   Hildred Laser, MD [facility]

## 2018-09-27 NOTE — Anesthesia Preprocedure Evaluation (Signed)
Anesthesia Evaluation  Patient identified by MRN, date of birth, ID band Patient awake    Reviewed: Allergy & Precautions, H&P , NPO status , Patient's Chart, lab work & pertinent test results  History of Anesthesia Complications Negative for: history of anesthetic complications  Airway Mallampati: III  TM Distance: >3 FB Neck ROM: full    Dental  (+) Chipped   Pulmonary neg shortness of breath, former smoker,           Cardiovascular Exercise Tolerance: Good hypertension,      Neuro/Psych    GI/Hepatic negative GI ROS, neg GERD  ,  Endo/Other    Renal/GU   negative genitourinary   Musculoskeletal   Abdominal   Peds  Hematology negative hematology ROS (+)   Anesthesia Other Findings Patient reports back pain  Past Medical History: 11/03/2016: History of cesarean section No date: Hypertension     Comment:  With past pregnancy No date: Ovarian cyst  Past Surgical History: No date: APPENDECTOMY 09/2011: CESAREAN SECTION; N/A 07/23/2015: LAPAROSCOPIC APPENDECTOMY; N/A     Comment:  Procedure: APPENDECTOMY LAPAROSCOPIC;  Surgeon: Lattie Haw, MD;  Location: [facility] ORS;  Service: General;                Laterality: N/A;  BMI    Body Mass Index:  31.89 kg/m      Reproductive/Obstetrics (+) Pregnancy                             Anesthesia Physical Anesthesia Plan  ASA: III  Anesthesia Plan: Spinal   Post-op Pain Management:    Induction:   PONV Risk Score and Plan:   Airway Management Planned: Natural Airway and Nasal Cannula  Additional Equipment:   Intra-op Plan:   Post-operative Plan:   Informed Consent: I have reviewed the patients History and Physical, chart, labs and discussed the procedure including the risks, benefits and alternatives for the proposed anesthesia with the patient or authorized representative who has indicated his/her  understanding and acceptance.     Dental Advisory Given  Plan Discussed with: Anesthesiologist, CRNA and Surgeon  Anesthesia Plan Comments: (Patient reports no bleeding problems and no anticoagulant use.  Plan for spinal with backup GA  Patient consented for risks of anesthesia including but not limited to:  - adverse reactions to medications - risk of bleeding, infection, nerve damage and headache - risk of failed spinal - damage to teeth, lips or other oral mucosa - sore throat or hoarseness - Damage to heart, brain, lungs or loss of life  Patient voiced understanding.)        Anesthesia Quick Evaluation

## 2018-09-27 NOTE — Telephone Encounter (Signed)
Spoke with patient and she stated that she had lost her mucus plug about 12:20 today. She stated that she had started having contractions or what she thinks is contractions yesterday sometime. She has not been keeping up with how long they are because she was not sure if they was contractions. Patient denies any leaking of fluid at this time. I told the patient that I would talk to Dr. Logan Bores when he came back to see if he wanted her to go to L&D to be checked. I told her that I would call her back.

## 2018-09-28 ENCOUNTER — Inpatient Hospital Stay: Payer: Medicaid Other | Admitting: Anesthesiology

## 2018-09-28 DIAGNOSIS — Z3A36 36 weeks gestation of pregnancy: Secondary | ICD-10-CM

## 2018-09-28 DIAGNOSIS — O99824 Streptococcus B carrier state complicating childbirth: Secondary | ICD-10-CM

## 2018-09-28 DIAGNOSIS — O34211 Maternal care for low transverse scar from previous cesarean delivery: Principal | ICD-10-CM

## 2018-09-28 DIAGNOSIS — O368390 Maternal care for abnormalities of the fetal heart rate or rhythm, unspecified trimester, not applicable or unspecified: Secondary | ICD-10-CM

## 2018-09-28 LAB — URINE DRUG SCREEN, QUALITATIVE (ARMC ONLY)
Amphetamines, Ur Screen: NOT DETECTED
Barbiturates, Ur Screen: NOT DETECTED
Benzodiazepine, Ur Scrn: NOT DETECTED
Cannabinoid 50 Ng, Ur ~~LOC~~: NOT DETECTED
Cocaine Metabolite,Ur ~~LOC~~: NOT DETECTED
MDMA (Ecstasy)Ur Screen: NOT DETECTED
Methadone Scn, Ur: NOT DETECTED
Opiate, Ur Screen: NOT DETECTED
Phencyclidine (PCP) Ur S: NOT DETECTED
TRICYCLIC, UR SCREEN: NOT DETECTED

## 2018-09-28 LAB — CBC
HCT: 25.9 % — ABNORMAL LOW (ref 36.0–46.0)
Hemoglobin: 8.2 g/dL — ABNORMAL LOW (ref 12.0–15.0)
MCH: 27.3 pg (ref 26.0–34.0)
MCHC: 31.7 g/dL (ref 30.0–36.0)
MCV: 86.3 fL (ref 80.0–100.0)
Platelets: 162 10*3/uL (ref 150–400)
RBC: 3 MIL/uL — ABNORMAL LOW (ref 3.87–5.11)
RDW: 13.6 % (ref 11.5–15.5)
WBC: 9.9 10*3/uL (ref 4.0–10.5)
nRBC: 0 % (ref 0.0–0.2)

## 2018-09-28 MED ORDER — SODIUM CHLORIDE 0.9 % IV SOLN
INTRAVENOUS | Status: DC | PRN
  Administered 2018-09-28: 50 ug/min via INTRAVENOUS

## 2018-09-28 MED ORDER — KETOROLAC TROMETHAMINE 30 MG/ML IJ SOLN
INTRAMUSCULAR | Status: AC
  Filled 2018-09-28: qty 1

## 2018-09-28 MED ORDER — FENTANYL CITRATE (PF) 100 MCG/2ML IJ SOLN
INTRAMUSCULAR | Status: DC | PRN
  Administered 2018-09-28: 50 ug via INTRAVENOUS
  Administered 2018-09-28: 15 ug via INTRATHECAL
  Administered 2018-09-28: 50 ug via INTRAVENOUS

## 2018-09-28 MED ORDER — MIDAZOLAM HCL 5 MG/5ML IJ SOLN
INTRAMUSCULAR | Status: DC | PRN
  Administered 2018-09-28: 2 mg via INTRAVENOUS

## 2018-09-28 MED ORDER — KETOROLAC TROMETHAMINE 30 MG/ML IJ SOLN
30.0000 mg | Freq: Four times a day (QID) | INTRAMUSCULAR | Status: DC
  Administered 2018-09-28: 30 mg via INTRAVENOUS

## 2018-09-28 MED ORDER — MORPHINE SULFATE (PF) 0.5 MG/ML IJ SOLN
INTRAMUSCULAR | Status: DC | PRN
  Administered 2018-09-28: .1 mg via INTRATHECAL

## 2018-09-28 MED ORDER — MIDAZOLAM HCL 2 MG/2ML IJ SOLN
INTRAMUSCULAR | Status: AC
  Filled 2018-09-28: qty 2

## 2018-09-28 MED ORDER — LACTATED RINGERS IV SOLN: INTRAVENOUS | Status: DC

## 2018-09-28 MED ORDER — LIDOCAINE 5 % EX PTCH
MEDICATED_PATCH | CUTANEOUS | Status: DC | PRN
  Administered 2018-09-28: 1 via TRANSDERMAL

## 2018-09-28 MED ORDER — OXYCODONE HCL 5 MG/5ML PO SOLN
5.0000 mg | Freq: Once | ORAL | Status: DC | PRN
  Filled 2018-09-28: qty 5

## 2018-09-28 MED ORDER — LIDOCAINE 5 % EX PTCH
1.0000 | MEDICATED_PATCH | CUTANEOUS | Status: DC
  Administered 2018-09-29: 1 via TRANSDERMAL
  Filled 2018-09-28: qty 1

## 2018-09-28 MED ORDER — OXYCODONE HCL 5 MG PO TABS: 5.0000 mg | ORAL_TABLET | Freq: Once | ORAL | Status: DC | PRN

## 2018-09-28 MED ORDER — FENTANYL CITRATE (PF) 100 MCG/2ML IJ SOLN
INTRAMUSCULAR | Status: AC
  Filled 2018-09-28: qty 2

## 2018-09-28 MED ORDER — CARBOPROST TROMETHAMINE 250 MCG/ML IM SOLN
INTRAMUSCULAR | Status: AC
  Filled 2018-09-28: qty 1

## 2018-09-28 MED ORDER — ZOLPIDEM TARTRATE 5 MG PO TABS: 5.0000 mg | ORAL_TABLET | Freq: Every evening | ORAL | Status: DC | PRN

## 2018-09-28 MED ORDER — TETANUS-DIPHTH-ACELL PERTUSSIS 5-2.5-18.5 LF-MCG/0.5 IM SUSP: 0.5000 mL | Freq: Once | INTRAMUSCULAR | Status: DC

## 2018-09-28 MED ORDER — KETOROLAC TROMETHAMINE 30 MG/ML IJ SOLN
30.0000 mg | Freq: Four times a day (QID) | INTRAMUSCULAR | Status: AC
  Administered 2018-09-28 (×2): 30 mg via INTRAVENOUS
  Filled 2018-09-28 (×2): qty 1

## 2018-09-28 MED ORDER — PRENATAL MULTIVITAMIN CH
1.0000 | ORAL_TABLET | Freq: Every day | ORAL | Status: DC
  Administered 2018-09-30: 1 via ORAL
  Filled 2018-09-28 (×3): qty 1

## 2018-09-28 MED ORDER — KETOROLAC TROMETHAMINE 30 MG/ML IJ SOLN: 30.0000 mg | Freq: Once | INTRAMUSCULAR | Status: AC

## 2018-09-28 MED ORDER — OXYCODONE HCL 5 MG PO TABS: 5.0000 mg | ORAL_TABLET | Freq: Four times a day (QID) | ORAL | Status: DC | PRN

## 2018-09-28 MED ORDER — BUPIVACAINE IN DEXTROSE 0.75-8.25 % IT SOLN
INTRATHECAL | Status: DC | PRN
  Administered 2018-09-28: 1.6 mL via INTRATHECAL

## 2018-09-28 MED ORDER — HYDROMORPHONE HCL 1 MG/ML IJ SOLN: 1.0000 mg | INTRAMUSCULAR | Status: DC | PRN

## 2018-09-28 MED ORDER — MAGNESIUM HYDROXIDE 400 MG/5ML PO SUSP
30.0000 mL | ORAL | Status: DC | PRN
  Filled 2018-09-28: qty 30

## 2018-09-28 MED ORDER — SIMETHICONE 80 MG PO CHEW
80.0000 mg | CHEWABLE_TABLET | ORAL | Status: DC | PRN
  Administered 2018-09-28: 80 mg via ORAL
  Filled 2018-09-28 (×2): qty 1

## 2018-09-28 MED ORDER — OXYCODONE-ACETAMINOPHEN 5-325 MG PO TABS: 1.0000 | ORAL_TABLET | ORAL | Status: DC | PRN

## 2018-09-28 MED ORDER — GABAPENTIN 300 MG PO CAPS
300.0000 mg | ORAL_CAPSULE | Freq: Two times a day (BID) | ORAL | Status: DC
  Administered 2018-09-28 – 2018-09-30 (×5): 300 mg via ORAL
  Filled 2018-09-28 (×7): qty 1

## 2018-09-28 MED ORDER — DIPHENHYDRAMINE HCL 25 MG PO CAPS: 25.0000 mg | ORAL_CAPSULE | ORAL | Status: DC | PRN

## 2018-09-28 MED ORDER — DIPHENHYDRAMINE HCL 50 MG/ML IJ SOLN: 12.5000 mg | INTRAMUSCULAR | Status: DC | PRN

## 2018-09-28 MED ORDER — DIBUCAINE 1 % RE OINT: 1.0000 "application " | TOPICAL_OINTMENT | RECTAL | Status: DC | PRN

## 2018-09-28 MED ORDER — OXYTOCIN 40 UNITS IN NORMAL SALINE INFUSION - SIMPLE MED
2.5000 [IU]/h | INTRAVENOUS | Status: AC
  Administered 2018-09-28: 2.5 [IU]/h via INTRAVENOUS
  Filled 2018-09-28: qty 1000

## 2018-09-28 MED ORDER — SIMETHICONE 80 MG PO CHEW
80.0000 mg | CHEWABLE_TABLET | ORAL | Status: DC
  Administered 2018-09-29 – 2018-09-30 (×2): 80 mg via ORAL
  Filled 2018-09-28 (×2): qty 1

## 2018-09-28 MED ORDER — SODIUM CHLORIDE 0.9% FLUSH: 3.0000 mL | INTRAVENOUS | Status: DC | PRN

## 2018-09-28 MED ORDER — VARICELLA VIRUS VACCINE LIVE 1350 PFU/0.5ML IJ SUSR
0.5000 mL | INTRAMUSCULAR | Status: DC | PRN
  Filled 2018-09-28: qty 0.5

## 2018-09-28 MED ORDER — WITCH HAZEL-GLYCERIN EX PADS: 1.0000 "application " | MEDICATED_PAD | CUTANEOUS | Status: DC | PRN

## 2018-09-28 MED ORDER — SENNOSIDES-DOCUSATE SODIUM 8.6-50 MG PO TABS
2.0000 | ORAL_TABLET | ORAL | Status: DC
  Administered 2018-09-29 – 2018-09-30 (×2): 2 via ORAL
  Filled 2018-09-28 (×2): qty 2

## 2018-09-28 MED ORDER — OXYTOCIN 40 UNITS IN NORMAL SALINE INFUSION - SIMPLE MED
INTRAVENOUS | Status: DC | PRN
  Administered 2018-09-28: 500 mL via INTRAVENOUS

## 2018-09-28 MED ORDER — NALBUPHINE HCL 10 MG/ML IJ SOLN: 5.0000 mg | Freq: Once | INTRAMUSCULAR | Status: DC | PRN

## 2018-09-28 MED ORDER — IBUPROFEN 800 MG PO TABS
800.0000 mg | ORAL_TABLET | Freq: Four times a day (QID) | ORAL | Status: DC
  Administered 2018-09-28 – 2018-09-30 (×6): 800 mg via ORAL
  Filled 2018-09-28 (×8): qty 1

## 2018-09-28 MED ORDER — ONDANSETRON HCL 4 MG/2ML IJ SOLN
INTRAMUSCULAR | Status: DC | PRN
  Administered 2018-09-28: 4 mg via INTRAVENOUS

## 2018-09-28 MED ORDER — COCONUT OIL OIL
1.0000 "application " | TOPICAL_OIL | Status: DC | PRN
  Administered 2018-09-28: 1 via TOPICAL
  Filled 2018-09-28: qty 120

## 2018-09-28 MED ORDER — ONDANSETRON HCL 4 MG/2ML IJ SOLN: 4.0000 mg | Freq: Three times a day (TID) | INTRAMUSCULAR | Status: DC | PRN

## 2018-09-28 MED ORDER — ACETAMINOPHEN 325 MG PO TABS
650.0000 mg | ORAL_TABLET | Freq: Four times a day (QID) | ORAL | Status: DC | PRN
  Administered 2018-09-28 – 2018-09-30 (×3): 650 mg via ORAL
  Filled 2018-09-28 (×3): qty 2

## 2018-09-28 MED ORDER — FERROUS SULFATE 325 (65 FE) MG PO TABS
325.0000 mg | ORAL_TABLET | Freq: Two times a day (BID) | ORAL | Status: DC
  Administered 2018-09-28 – 2018-09-30 (×5): 325 mg via ORAL
  Filled 2018-09-28 (×5): qty 1

## 2018-09-28 MED ORDER — ACETAMINOPHEN 325 MG PO TABS: 650.0000 mg | ORAL_TABLET | Freq: Four times a day (QID) | ORAL | Status: DC

## 2018-09-28 MED ORDER — KETOROLAC TROMETHAMINE 30 MG/ML IJ SOLN: 30.0000 mg | Freq: Four times a day (QID) | INTRAMUSCULAR | Status: DC

## 2018-09-28 MED ORDER — OXYCODONE-ACETAMINOPHEN 5-325 MG PO TABS: 2.0000 | ORAL_TABLET | ORAL | Status: DC | PRN

## 2018-09-28 MED ORDER — FENTANYL CITRATE (PF) 100 MCG/2ML IJ SOLN: 25.0000 ug | INTRAMUSCULAR | Status: DC | PRN

## 2018-09-28 MED ORDER — MEPERIDINE HCL 25 MG/ML IJ SOLN: 6.2500 mg | INTRAMUSCULAR | Status: DC | PRN

## 2018-09-28 MED ORDER — METHYLERGONOVINE MALEATE 0.2 MG/ML IJ SOLN
INTRAMUSCULAR | Status: AC
  Filled 2018-09-28: qty 1

## 2018-09-28 MED ORDER — MENTHOL 3 MG MT LOZG
1.0000 | LOZENGE | OROMUCOSAL | Status: DC | PRN
  Filled 2018-09-28: qty 9

## 2018-09-28 MED ORDER — 0.9 % SODIUM CHLORIDE (POUR BTL) OPTIME
TOPICAL | Status: DC | PRN
  Administered 2018-09-28: 300 mL

## 2018-09-28 MED ORDER — NALOXONE HCL 0.4 MG/ML IJ SOLN: 0.4000 mg | INTRAMUSCULAR | Status: DC | PRN

## 2018-09-28 MED ORDER — TRAMADOL HCL 50 MG PO TABS
50.0000 mg | ORAL_TABLET | Freq: Four times a day (QID) | ORAL | Status: DC | PRN
  Filled 2018-09-28: qty 1

## 2018-09-28 MED ORDER — NALBUPHINE HCL 10 MG/ML IJ SOLN: 5.0000 mg | INTRAMUSCULAR | Status: DC | PRN

## 2018-09-28 NOTE — Anesthesia Post-op Follow-up Note (Signed)
  Anesthesia Pain Follow-up Note  Patient: Brittany Young  Day #: 1  Date of Follow-up: 09/28/2018 Time: 8:04 AM  Last Vitals:  Vitals:   09/28/18 0605 09/28/18 0744  BP:  114/68  Pulse: 95 63  Resp:  20  Temp:  37.2 C  SpO2: 98% 98%    Level of Consciousness: alert  Pain: mild   Side Effects:None  Catheter Site Exam:clean     Plan: D/C from anesthesia care at surgeon's request  Jules Schick

## 2018-09-28 NOTE — Anesthesia Postprocedure Evaluation (Signed)
Anesthesia Post Note  Patient: Brittany Young  Procedure(s) Performed: CESAREAN SECTION repeat (N/A Abdomen)  Patient location during evaluation: Mother Baby Anesthesia Type: Spinal Level of consciousness: oriented and awake and alert Pain management: pain level controlled Vital Signs Assessment: post-procedure vital signs reviewed and stable Respiratory status: spontaneous breathing and respiratory function stable Cardiovascular status: blood pressure returned to baseline and stable Postop Assessment: no headache, no backache, no apparent nausea or vomiting and able to ambulate Anesthetic complications: no     Last Vitals:  Vitals:   09/28/18 0605 09/28/18 0744  BP:  114/68  Pulse: 95 63  Resp:  20  Temp:  37.2 C  SpO2: 98% 98%    Last Pain:  Vitals:   09/28/18 0744  TempSrc: Oral  PainSc:                  Jules Schick

## 2018-09-28 NOTE — Op Note (Signed)
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  Post-operative Diagnosis: Same, with postpartum hemorrhage  Surgeon: Hildred Laser, MD  Assistants:  [facility], [place] Tech  Procedure: Repeat low transverse Cesarean Section  Anesthesia: Spinal anesthesia  Findings: Female infant, cephalic presentation, 2325 grams, with Apgar scores of 8 at one minute and 9 at five minutes. Intact placenta with 3 vessel cord.  Placenta with greenish hue of membranes. Nuchal cord x 1, tight, reducible after delivery of fetal head. Tea colored amniotic fluid at rupture of membranes. The uterine outline, tubes and ovaries appeared normal.   Procedure Details: The patient was seen in the Holding Room. The risks, benefits, complications, treatment options, and expected outcomes were discussed with the patient.  The patient concurred with the proposed plan, giving informed consent.  The site of surgery properly noted/marked. The patient was taken to the Operating Room, identified as Brittany Young and the procedure verified as repeat C-Section Delivery. A Time Out was held and the above information confirmed.  After induction of anesthesia, the patient was draped and prepped in the usual sterile manner. Anesthesia was tested and noted to be adequate. A Pfannenstiel incision was made and carried down through the subcutaneous tissue to the fascia. Fascial incision was made and extended transversely. The fascia was separated from the underlying rectus tissue superiorly and inferiorly. The peritoneum was identified and entered. Peritoneal incision was  extended longitudinally. An Alexis retractor was inserted and used for retraction. An attempt was made to create a bladder flap, however scant thin tissue present at the uterovesical junction. A low transverse uterine incision was made. Delivered from cephalic presentation was a 2325 gram Female with Apgar scores of 8 at one minute and 9 at five minutes.  Tight nuchal cord x 1 was reduced after delivery of the fetal head.The assistant was able to apply adequate fundal pressure to allow for successful delivery of the fetus. After the umbilical cord was clamped and cut, cord blood was obtained for evaluation. The placenta was removed intact and appeared discolored with a greenish hue. The uterus was exteriorized and cleared of all clots and debris. The uterine outline, tubes and ovaries appeared normal.  The uterine incision was closed with running locked sutures of 0-Vicryl.  A second suture of 0-Vicryl was used to place a figure-of-eight stitch at the right lateral edge of the incision where bleeding was noted.  Hemostasis was observed.  The fascia was then reapproximated with a running suture of 0-Vicryl. The skin was reapproximated with 4-0 Monocryl.  Instrument, sponge, and needle counts were correct prior the abdominal closure and at the conclusion of the case.    Estimated Blood Loss:  1266 ml      Drains: foley catheter to gravity drainage, 150 ml of clear urine at end of the procedure         Total IV Fluids:  500 ml  Specimens: Placenta sent to Pathology.  Cord blood obtained due to maternal blood type and for chromosomal analysis due to positive genetic testing for Turner's syndrome (declined amniocentesis)         Implants: None         Complications:  None; patient tolerated the procedure well.  Disposition: PACU - hemodynamically stable.         Condition: stable   Hildred Laser, MD [facility]

## 2018-09-28 NOTE — Anesthesia Post-op Follow-up Note (Signed)
Anesthesia QCDR form completed.        

## 2018-09-28 NOTE — Transfer of Care (Signed)
Immediate Anesthesia Transfer of Care Note  Patient: Brittany Young  Procedure(s) Performed: CESAREAN SECTION repeat (N/A Abdomen)  Patient Location: PACU  Anesthesia Type:Spinal  Level of Consciousness: awake, alert  and oriented  Airway & Oxygen Therapy: Patient Spontanous Breathing  Post-op Assessment: Post -op Vital signs reviewed and stable  Post vital signs: stable  Last Vitals:  Vitals Value Taken Time  BP 144/91 09/28/2018  1:30 AM  Temp 36.9 C 09/28/2018  1:30 AM  Pulse 83 09/28/2018  1:30 AM  Resp 12 09/28/2018  1:30 AM  SpO2 100 % 09/28/2018  1:30 AM    Last Pain:  Vitals:   09/28/18 0130  TempSrc: Oral  PainSc:       Patients Stated Pain Goal: 0 (09/27/18 2312)  Complications: No apparent anesthesia complications

## 2018-09-28 NOTE — Anesthesia Procedure Notes (Signed)
Spinal  Start time: 09/28/2018 12:18 AM End time: 09/28/2018 12:20 AM Staffing Anesthesiologist: Piscitello, Cleda Mccreedy, MD Resident/CRNA: Irving Burton, CRNA Performed: resident/CRNA  Preanesthetic Checklist Completed: patient identified, site marked, surgical consent, pre-op evaluation, IV checked, risks and benefits discussed and monitors and equipment checked Spinal Block Patient position: sitting Prep: ChloraPrep and site prepped and draped Patient monitoring: heart rate, continuous pulse ox and blood pressure Approach: midline Location: L3-4 Injection technique: single-shot Needle Needle type: Pencan  Needle gauge: 24 G

## 2018-09-29 LAB — RPR: RPR Ser Ql: NONREACTIVE

## 2018-09-29 LAB — SURGICAL PATHOLOGY

## 2018-09-29 MED ORDER — OXYCODONE-ACETAMINOPHEN 5-325 MG PO TABS: 1.0000 | ORAL_TABLET | Freq: Four times a day (QID) | ORAL | 0 refills | Status: DC | PRN

## 2018-09-29 MED ORDER — IBUPROFEN 800 MG PO TABS: 800.0000 mg | ORAL_TABLET | Freq: Three times a day (TID) | ORAL | 1 refills | Status: DC | PRN

## 2018-09-29 MED ORDER — DOCUSATE SODIUM 100 MG PO CAPS: 100.0000 mg | ORAL_CAPSULE | Freq: Two times a day (BID) | ORAL | 2 refills | Status: DC | PRN

## 2018-09-29 MED ORDER — FERROUS SULFATE 325 (65 FE) MG PO TABS: 325.0000 mg | ORAL_TABLET | Freq: Two times a day (BID) | ORAL | 3 refills | Status: DC

## 2018-09-29 NOTE — Discharge Summary (Signed)
OB Discharge Summary     Patient Name: Brittany Young DOB: 10-22-1995 MRN: [id number]  Date of admission: 09/27/2018 Delivering MD: Hildred Laser   Date of discharge: 09/30/2018   Admitting diagnosis: 36 wks preg Intrauterine pregnancy: [redacted]w[redacted]d     Secondary diagnosis:  Principal Problem:   Preterm labor in third trimester Active Problems:   History of pregnancy induced hypertension   Maternal varicella, non-immune   Abnormal findings on prenatal screening   History of cesarean delivery affecting pregnancy   Non-reassuring fetal heart rate or rhythm affecting management of mother  Additional problems: History of gestational HTN in prior pregnancy, Anemia of pregnancy     Discharge diagnosis: Term Pregnancy Delivered, Anemia and PPH                                                                                                Post partum procedures:None  Augmentation: N/A  Complications: Hemorrhage>1075mL  Hospital course:  Onset of Labor With Unplanned C/S  23 y.o. yo [id number] at [redacted]w[redacted]d was admitted in Latent Labor in 09/27/2018. Patient had a labor course significant for history of prior C-section x 1, declining vaginal attempt, preterm labor, and non-reassuring fetal tracing. Membrane Rupture Time/Date:  (at time of C-section)  The patient went for cesarean section due to Prior Uterine Surgery and Non-Reassuring FHR, and delivered a Viable infant,09/28/2018  Details of operation can be found in separate operative note. Patient had an uncomplicated postpartum course.  She is ambulating,tolerating a regular diet, passing flatus, and urinating well.  Patient is discharged home in stable condition 09/29/18.  Physical exam  Vitals:   09/29/18 0804 09/29/18 1141 09/29/18 2355 09/30/18 0757  BP: 109/70 117/80 133/86 123/85  Pulse: 75 99 93 84  Resp: 18 18 20 20   Temp: 97.7 F (36.5 C) 98 F (36.7 C) 98.3 F (36.8 C) 98.4 F (36.9 C)  TempSrc: Oral Oral Oral Oral  SpO2: 100%   100% 100%  Weight:      Height:        General: alert, cooperative and no distress Lochia: appropriate Uterine Fundus: firm Incision: Healing well with no significant drainage, No significant erythema, Dressing is clean, dry, and intact DVT Evaluation: No evidence of DVT seen on physical exam. Negative Homan's sign. No cords or calf tenderness. No significant calf/ankle edema. Labs: Lab Results  Component Value Date   WBC 9.9 09/28/2018   HGB 8.2 (L) 09/28/2018   HCT 25.9 (L) 09/28/2018   MCV 86.3 09/28/2018   PLT 162 09/28/2018   CMP Latest Ref Rng & Units 09/01/2018  Glucose 70 - 99 mg/dL 92  BUN 6 - 20 mg/dL 7  Creatinine 0.32 - 1.22 mg/dL 4.82  Sodium 500 - 370 mmol/L 133(L)  Potassium 3.5 - 5.1 mmol/L 2.9(L)  Chloride 98 - 111 mmol/L 102  CO2 22 - 32 mmol/L 21(L)  Calcium 8.9 - 10.3 mg/dL 7.9(L)  Total Protein 6.5 - 8.1 g/dL 7.2  Total Bilirubin 0.3 - 1.2 mg/dL 0.5  Alkaline Phos 38 - 126 U/L 63  AST 15 - 41 U/L 25  ALT  0 - 44 U/L 26    Discharge instruction: per After Visit Summary and "Baby and Me Booklet".  After visit meds:  Allergies as of 09/29/2018   No Known Allergies     Medication List    TAKE these medications   docusate sodium 100 MG capsule Commonly known as:  COLACE Take 1 capsule (100 mg total) by mouth 2 (two) times daily as needed.   ferrous sulfate 325 (65 FE) MG tablet Take 1 tablet (325 mg total) by mouth 2 (two) times daily with a meal.   ibuprofen 800 MG tablet Commonly known as:  ADVIL,MOTRIN Take 1 tablet (800 mg total) by mouth every 8 (eight) hours as needed for mild pain or moderate pain.   oxyCODONE-acetaminophen 5-325 MG tablet Commonly known as:  PERCOCET/ROXICET Take 1-2 tablets by mouth every 6 (six) hours as needed for moderate pain.   prenatal multivitamin Tabs tablet Take 1 tablet by mouth daily at 12 noon.       Diet: routine diet  Activity: Advance as tolerated. Pelvic rest for 6 weeks.   Outpatient  follow up:1-2 weeks for incision check Follow up Appt: Future Appointments  Date Time Provider Department Center  10/09/2018 10:00 AM Linzie Collin, MD [facility] None   Follow up Visit:No follow-ups on file.  Postpartum contraception: IUD Mirena  Newborn Data: Live born female  Birth Weight: 5 lb 1.8 oz (2320 g) APGAR: 8, 9  Newborn Delivery   Birth date/time:  09/28/2018 00:46:00 Delivery type:  C-Section, Low Transverse Trial of labor:  No C-section categorization:  Repeat     Baby Feeding: Bottle (and possible breast feeding short term) Disposition:home with mother   10/02/2018 Hildred Laser, MD

## 2018-09-29 NOTE — Progress Notes (Addendum)
Postpartum Day # 1: Cesarean Delivery (repeat) with postpartum hemorrhage  Subjective: Patient reports tolerating PO, + flatus and no problems voiding.  She notes pain is controlled with pain meds. Desires to go home today as possible.    Objective: Vital signs in last 24 hours: Temp:  [97.7 F (36.5 C)-99.8 F (37.7 C)] 97.7 F (36.5 C) (03/27 0804) Pulse Rate:  [75-103] 75 (03/27 0804) Resp:  [18-20] 18 (03/27 0804) BP: (109-120)/(58-70) 109/70 (03/27 0804) SpO2:  [94 %-100 %] 100 % (03/27 0804)  Physical Exam:  General: alert and no distress Lungs: clear to auscultation bilaterally Breasts: normal appearance, no masses or tenderness Heart: regular rate and rhythm, S1, S2 normal, no murmur, click, rub or gallop Abdomen: soft, non-tender; bowel sounds normal; no masses,  no organomegaly Pelvis: Lochia appropriate, Uterine Fundus firm, Incision: healing well, no significant drainage, no dehiscence, no significant erythema Extremities: DVT Evaluation: No evidence of DVT seen on physical exam. Negative Homan's sign. No cords or calf tenderness. No significant calf/ankle edema.  Recent Labs    09/27/18 2117 09/28/18 0803  HGB 10.1* 8.2*  HCT 32.0* 25.9*    Assessment/Plan: Status post Cesarean section. Doing well postoperatively.  Regular diet Continue PO pain management Bottle and breast feeding Contraception: considering Mirena IUD Anemia of pregnancy, worsened after acute surgical blood loss with postpartum hemorrhage. Asymptomatic. Will treat with PO iron.  Needs Varicella vaccine prior to discharge Discharge home with standard precautions and return to clinic in 1 week for incision check if baby also stable for discharge.   Hildred Laser, MD [facility]

## 2018-10-01 NOTE — Progress Notes (Signed)
Reviewed D/C instructions with pt and family. Pt verbalized understanding of teaching. Discharged to home via W/C. Pt to schedule f/u appt.  

## 2018-10-03 ENCOUNTER — Encounter: Payer: Medicaid Other | Admitting: Obstetrics and Gynecology

## 2018-10-09 ENCOUNTER — Telehealth: Payer: Self-pay | Admitting: Surgical

## 2018-10-09 ENCOUNTER — Encounter: Payer: Self-pay | Admitting: Obstetrics and Gynecology

## 2018-10-09 NOTE — Telephone Encounter (Signed)
Coronavirus (COVID-19) Are you at risk?  Are you at risk for the Coronavirus (COVID-19)?  To be considered HIGH RISK for Coronavirus (COVID-19), you have to meet the following criteria:  . Traveled to [place], [place], [place], [place] or [place]; or in the [place] to [place], [place], [place], or [place]; and have fever, cough, and shortness of breath within the last 2 weeks of travel OR . Been in close contact with a person diagnosed with COVID-19 within the last 2 weeks and have fever, cough, and shortness of breath . IF YOU DO NOT MEET THESE CRITERIA, YOU ARE CONSIDERED LOW RISK FOR COVID-19.  What to do if you are HIGH RISK for COVID-19?  . If you are having a medical emergency, call 911. . Seek medical care right away. Before you go to a doctor's office, urgent care or emergency department, call ahead and tell them about your recent travel, contact with someone diagnosed with COVID-19, and your symptoms. You should receive instructions from your physician's office regarding next steps of care.  . When you arrive at healthcare provider, tell the healthcare staff immediately you have returned from visiting [place], [place], [place], [place] or [place]; or traveled in the [place] to [place], [place], [place], or [place]; in the last two weeks or you have been in close contact with a person diagnosed with COVID-19 in the last 2 weeks.   . Tell the health care staff about your symptoms: fever, cough and shortness of breath. . After you have been seen by a medical provider, you will be either: o Tested for (COVID-19) and discharged home on quarantine except to seek medical care if symptoms worsen, and asked to  - Stay home and avoid contact with others until you get your results (4-5 days)  - Avoid travel on public transportation if possible (such as bus, train, or airplane) or o Sent to the Emergency Department by EMS for evaluation, COVID-19 testing, and possible  admission depending on your condition and test results.  What to do if you are LOW RISK for COVID-19?  Reduce your risk of any infection by using the same precautions used for avoiding the common cold or flu:  . Wash your hands often with soap and warm water for at least 20 seconds.  If soap and water are not readily available, use an alcohol-based hand sanitizer with at least 60% alcohol.  . If coughing or sneezing, cover your mouth and nose by coughing or sneezing into the elbow areas of your shirt or coat, into a tissue or into your sleeve (not your hands). . Avoid shaking hands with others and consider head nods or verbal greetings only. . Avoid touching your eyes, nose, or mouth with unwashed hands.  . Avoid close contact with people who are sick. . Avoid places or events with large numbers of people in one location, like concerts or sporting events. . Carefully consider travel plans you have or are making. . If you are planning any travel outside or inside the [place], visit the CDC's Travelers' Health webpage for the latest health notices. . If you have some symptoms but not all symptoms, continue to monitor at home and seek medical attention if your symptoms worsen. . If you are having a medical emergency, call 911.   ADDITIONAL HEALTHCARE OPTIONS FOR PATIENTS  [facility] / e-Visit: [email or web address]         [facility]: [phone]  [facility]: [phone]                   [facility]: [phone]    Patient has been screened and informed of no visitation policy. JW

## 2018-10-10 ENCOUNTER — Ambulatory Visit (INDEPENDENT_AMBULATORY_CARE_PROVIDER_SITE_OTHER): Payer: Medicaid Other | Admitting: Obstetrics and Gynecology

## 2018-10-10 ENCOUNTER — Encounter: Payer: Self-pay | Admitting: Obstetrics and Gynecology

## 2018-10-10 ENCOUNTER — Encounter: Payer: Medicaid Other | Admitting: Obstetrics and Gynecology

## 2018-10-10 ENCOUNTER — Other Ambulatory Visit: Payer: Self-pay

## 2018-10-10 VITALS — BP 153/106 | HR 79 | Ht 63.0 in | Wt 165.3 lb

## 2018-10-10 DIAGNOSIS — Z98891 History of uterine scar from previous surgery: Secondary | ICD-10-CM

## 2018-10-10 DIAGNOSIS — Z8759 Personal history of other complications of pregnancy, childbirth and the puerperium: Secondary | ICD-10-CM

## 2018-10-10 DIAGNOSIS — Z09 Encounter for follow-up examination after completed treatment for conditions other than malignant neoplasm: Secondary | ICD-10-CM

## 2018-10-10 DIAGNOSIS — R030 Elevated blood-pressure reading, without diagnosis of hypertension: Secondary | ICD-10-CM

## 2018-10-10 MED ORDER — LABETALOL HCL 200 MG PO TABS: 200.0000 mg | ORAL_TABLET | Freq: Two times a day (BID) | ORAL | 1 refills | Status: DC

## 2018-10-10 NOTE — Progress Notes (Signed)
    OBSTETRICS/GYNECOLOGY POST-OPERATIVE CLINIC VISIT  Subjective:     Brittany Young is a 23 y.o. female who presents to the clinic 1 weeks status post repeat C-section  for prior C-section declining vaginal attempt. Eating a regular diet without difficulty. Bowel movements are normal. The patient is not having any pain.  Denies any depression symptoms currently. Unsure about contraceptive desires.  The following portions of the patient's history were reviewed and updated as appropriate: allergies, current medications, past family history, past medical history, past social history, past surgical history and problem list.  Review of Systems Pertinent items noted in HPI and remainder of comprehensive ROS otherwise negative.    Objective:    BP (!) 153/106   Pulse 79   Ht 5\' 3"  (1.6 m)   Wt 165 lb 4.8 oz (75 kg)   LMP 01/14/2018   Breastfeeding No   BMI 29.28 kg/m .  On left arm 160/112.  Repeat also 153/106 at end of visit.   General:  alert and no distress  Abdomen: soft, bowel sounds active, non-tender  Incision:   healing well, no drainage, no erythema, no hernia, no seroma, no swelling, no dehiscence, incision well approximated    Pathology:  A. PLACENTA, THIRD TRIMESTER; CESAREAN SECTION:  - PLACENTAL WEIGHT 481 GRAMS, APPROPRIATE FOR GESTATIONAL AGE.  - ACUTE CHORIOAMNIONITIS WITH EARLY FETAL INFLAMMATORY RESPONSE:  UMBILICAL CORD PHLEBITIS AND CHORIONIC PLATE VASCULITIS.  - LARGE OLD SUBCHORIONIC HEMATOMA, 9.5 CM, ADJACENT TO UMBILICAL CORD  INSERTION.  - MULTIFOCAL INTERVILLOUS AND SUBCHORIONIC THROMBI.  - CHORIOAMNIOTIC HEMOSIDEROSIS (HEMOSIDERIN-LADEN CHORIONIC  MACROPHAGES).   Assessment:    Doing well postoperatively.  S/p Cesarean section Elevated blood pressure History of gestational HTN in prior pregnancy  Plan:   1. Continue any current medications as needed.  Repeat BP elevated, will start on Labetalol 200 mg BID. To f/u in 1 week for BP check.  2.  Wound care discussed. 3. Operative findings again reviewed. Pathology report discussed. 4. Activity restrictions: no bending, stooping, or squatting, no lifting more than 10 pounds and pelvic rest x 5 weeks  5. Anticipated return to work: 5weeks. 6. Follow up: 5 weeks for final postpartum visit.  Given handout on contraceptive options.    Hildred Laser, MD [facility]

## 2018-10-10 NOTE — Progress Notes (Signed)
Pt is present today for wound check after a having a c-section. Pt stated that her c-section incision was healing well.

## 2018-10-17 ENCOUNTER — Telehealth: Payer: Self-pay

## 2018-10-17 NOTE — Telephone Encounter (Signed)
Coronavirus (COVID-19) Are you at risk?  Are you at risk for the Coronavirus (COVID-19)?  To be considered HIGH RISK for Coronavirus (COVID-19), you have to meet the following criteria:  . Traveled to [place], [place], [place], [place] or [place]; or in the [place] to [place], [place], [place], or [place]; and have fever, cough, and shortness of breath within the last 2 weeks of travel OR . Been in close contact with a person diagnosed with COVID-19 within the last 2 weeks and have fever, cough, and shortness of breath . IF YOU DO NOT MEET THESE CRITERIA, YOU ARE CONSIDERED LOW RISK FOR COVID-19.  What to do if you are HIGH RISK for COVID-19?  . If you are having a medical emergency, call 911. . Seek medical care right away. Before you go to a doctor's office, urgent care or emergency department, call ahead and tell them about your recent travel, contact with someone diagnosed with COVID-19, and your symptoms. You should receive instructions from your physician's office regarding next steps of care.  . When you arrive at healthcare provider, tell the healthcare staff immediately you have returned from visiting [place], [place], [place], [place] or [place]; or traveled in the [place] to [place], [place], [place], or [place]; in the last two weeks or you have been in close contact with a person diagnosed with COVID-19 in the last 2 weeks.   . Tell the health care staff about your symptoms: fever, cough and shortness of breath. . After you have been seen by a medical provider, you will be either: o Tested for (COVID-19) and discharged home on quarantine except to seek medical care if symptoms worsen, and asked to  - Stay home and avoid contact with others until you get your results (4-5 days)  - Avoid travel on public transportation if possible (such as bus, train, or airplane) or o Sent to the Emergency Department by EMS for evaluation, COVID-19 testing, and possible  admission depending on your condition and test results.  What to do if you are LOW RISK for COVID-19?  Reduce your risk of any infection by using the same precautions used for avoiding the common cold or flu:  . Wash your hands often with soap and warm water for at least 20 seconds.  If soap and water are not readily available, use an alcohol-based hand sanitizer with at least 60% alcohol.  . If coughing or sneezing, cover your mouth and nose by coughing or sneezing into the elbow areas of your shirt or coat, into a tissue or into your sleeve (not your hands). . Avoid shaking hands with others and consider head nods or verbal greetings only. . Avoid touching your eyes, nose, or mouth with unwashed hands.  . Avoid close contact with people who are sick. . Avoid places or events with large numbers of people in one location, like concerts or sporting events. . Carefully consider travel plans you have or are making. . If you are planning any travel outside or inside the [place], visit the CDC's Travelers' Health webpage for the latest health notices. . If you have some symptoms but not all symptoms, continue to monitor at home and seek medical attention if your symptoms worsen. . If you are having a medical emergency, call 911.   ADDITIONAL HEALTHCARE OPTIONS FOR PATIENTS  [facility] / e-Visit: [email or web address]         [facility]: [phone]  [place]   [facility]: [phone]                   [facility]: [phone]   Prescreened. Neg .cm 

## 2018-10-18 ENCOUNTER — Ambulatory Visit (INDEPENDENT_AMBULATORY_CARE_PROVIDER_SITE_OTHER): Payer: Medicaid Other | Admitting: Obstetrics and Gynecology

## 2018-10-18 ENCOUNTER — Other Ambulatory Visit: Payer: Self-pay

## 2018-10-18 VITALS — BP 152/102 | HR 86 | Ht 63.0 in | Wt 164.3 lb

## 2018-10-18 DIAGNOSIS — Z0130 Encounter for examination of blood pressure without abnormal findings: Secondary | ICD-10-CM

## 2018-10-18 DIAGNOSIS — O165 Unspecified maternal hypertension, complicating the puerperium: Secondary | ICD-10-CM

## 2018-10-18 MED ORDER — LABETALOL HCL 200 MG PO TABS: 200.0000 mg | ORAL_TABLET | Freq: Two times a day (BID) | ORAL | 1 refills | Status: DC

## 2018-10-18 NOTE — Progress Notes (Signed)
Pt present today for BP check due to having an elevated BP after pregnancy. Pt's is currently taking Labetalol HCL 200mg  twice a day. BP today was 152/102. Pt was informed that Dr. Valentino Saxon wants her to increase her Labetalol 200mg  to 2 tablets twice daily. Pt is aware that she is to take 2 tablets in the morning and 2 tablets in the evening. Pt is to follow up on Tuesday, October 24, 2018 for repeat BP check.

## 2018-10-24 ENCOUNTER — Encounter: Payer: Self-pay | Admitting: Obstetrics and Gynecology

## 2018-10-24 ENCOUNTER — Other Ambulatory Visit: Payer: Self-pay

## 2018-10-24 ENCOUNTER — Ambulatory Visit (INDEPENDENT_AMBULATORY_CARE_PROVIDER_SITE_OTHER): Payer: Medicaid Other | Admitting: Obstetrics and Gynecology

## 2018-10-24 VITALS — BP 173/124 | HR 80 | Ht 63.0 in | Wt 165.5 lb

## 2018-10-24 DIAGNOSIS — O165 Unspecified maternal hypertension, complicating the puerperium: Secondary | ICD-10-CM

## 2018-10-24 MED ORDER — NIFEDIPINE ER OSMOTIC RELEASE 30 MG PO TB24: 30.0000 mg | ORAL_TABLET | Freq: Two times a day (BID) | ORAL | 0 refills | Status: DC

## 2018-10-24 NOTE — Addendum Note (Signed)
Addended by: Fabian November on: 10/24/2018 02:25 PM   Modules accepted: Orders, Level of Service

## 2018-10-24 NOTE — Progress Notes (Signed)
    GYNECOLOGY PROGRESS NOTE  Subjective:    Patient ID: Brittany Young, female    DOB: [birth year], 23 y.o.   MRN: [id number]  HPI  Patient is a 23 y.o. G27P1102 female who presents 2 weeks postpartum s/p repeat C-section for BP check.  She is currently taking Labetalol 400 mg BID.  Patient notes that she occasionally has headaches (frontal), however does not take anything for them as she does not like to take medications. Denies blurred vision or RUQ pain.   The following portions of the patient's history were reviewed and updated as appropriate: allergies, current medications, past family history, past medical history, past social history, past surgical history and problem list.  Review of Systems Pertinent items noted in HPI and remainder of comprehensive ROS otherwise negative.   Objective:   Blood pressure (!) 173/124, pulse 80, height 5\' 3"  (1.6 m), weight 165 lb 8 oz (75.1 kg), last menstrual period 01/14/2018, not currently breastfeeding. General appearance: alert and no distress Abdomen: soft, non-tender; bowel sounds normal; no masses,  no organomegaly Extremities: extremities normal, atraumatic, no cyanosis or edema   Assessment:   Postpartum hypertension, to rule out postpartum pre-eclampsia  Plan:   - Changed anti-hypertensive from Labetalol to Procardia XL (30 mg BID).  Patient advised to either purchase BP cuff or go to local pharmacy to check BP on Friday and report readings via Mychart.  If normal, can continue current use.  If she is unable to do this, will need to return for BP check in office.  - PIH labs ordered to r/o postpartum pre-eclampsia.     Hildred Laser, MD [facility] 10/24/2018 2:24 PM

## 2018-10-24 NOTE — Progress Notes (Addendum)
Pt is present today for BP check due to having elevated levels postpartum. Pt stated that she was has been taking her medication as prescribed. Pt's BP is still elevated. Pt stated that she has headaches sometimes.

## 2018-10-25 LAB — COMPREHENSIVE METABOLIC PANEL
ALT: 17 IU/L (ref 0–32)
AST: 18 IU/L (ref 0–40)
Albumin/Globulin Ratio: 1.5 (ref 1.2–2.2)
Albumin: 4.4 g/dL (ref 3.9–5.0)
Alkaline Phosphatase: 69 IU/L (ref 39–117)
BUN/Creatinine Ratio: 6 — ABNORMAL LOW (ref 9–23)
BUN: 5 mg/dL — ABNORMAL LOW (ref 6–20)
Bilirubin Total: 0.4 mg/dL (ref 0.0–1.2)
CO2: 21 mmol/L (ref 20–29)
Calcium: 9.3 mg/dL (ref 8.7–10.2)
Chloride: 102 mmol/L (ref 96–106)
Creatinine, Ser: 0.8 mg/dL (ref 0.57–1.00)
GFR calc Af Amer: 121 mL/min/{1.73_m2} (ref >59)
GFR calc non Af Amer: 105 mL/min/{1.73_m2} (ref >59)
Globulin, Total: 3 g/dL (ref 1.5–4.5)
Glucose: 88 mg/dL (ref 65–99)
Potassium: 3.2 mmol/L — ABNORMAL LOW (ref 3.5–5.2)
Sodium: 141 mmol/L (ref 134–144)
Total Protein: 7.4 g/dL (ref 6.0–8.5)

## 2018-10-25 LAB — CBC
Hematocrit: 33.3 % — ABNORMAL LOW (ref 34.0–46.6)
Hemoglobin: 10.7 g/dL — ABNORMAL LOW (ref 11.1–15.9)
MCH: 27.2 pg (ref 26.6–33.0)
MCHC: 32.1 g/dL (ref 31.5–35.7)
MCV: 85 fL (ref 79–97)
Platelets: 283 10*3/uL (ref 150–450)
RBC: 3.94 x10E6/uL (ref 3.77–5.28)
RDW: 13.5 % (ref 11.7–15.4)
WBC: 5 10*3/uL (ref 3.4–10.8)

## 2018-10-25 LAB — PROTEIN / CREATININE RATIO, URINE
Creatinine, Urine: 133.2 mg/dL
Protein, Ur: 10.2 mg/dL
Protein/Creat Ratio: 77 mg/g creat (ref 0–200)

## 2018-10-31 ENCOUNTER — Telehealth: Payer: Self-pay

## 2018-10-31 NOTE — Telephone Encounter (Signed)
Coronavirus (COVID-19) Are you at risk?  Are you at risk for the Coronavirus (COVID-19)?  To be considered HIGH RISK for Coronavirus (COVID-19), you have to meet the following criteria:  . Traveled to [place], [place], [place], [place] or [place]; or in the [place] to [place], [place], [place], or [place]; and have fever, cough, and shortness of breath within the last 2 weeks of travel OR . Been in close contact with a person diagnosed with COVID-19 within the last 2 weeks and have fever, cough, and shortness of breath . IF YOU DO NOT MEET THESE CRITERIA, YOU ARE CONSIDERED LOW RISK FOR COVID-19.  What to do if you are HIGH RISK for COVID-19?  . If you are having a medical emergency, call 911. . Seek medical care right away. Before you go to a doctor's office, urgent care or emergency department, call ahead and tell them about your recent travel, contact with someone diagnosed with COVID-19, and your symptoms. You should receive instructions from your physician's office regarding next steps of care.  . When you arrive at healthcare provider, tell the healthcare staff immediately you have returned from visiting [place], [place], [place], [place] or [place]; or traveled in the [place] to [place], [place], [place], or [place]; in the last two weeks or you have been in close contact with a person diagnosed with COVID-19 in the last 2 weeks.   . Tell the health care staff about your symptoms: fever, cough and shortness of breath. . After you have been seen by a medical provider, you will be either: o Tested for (COVID-19) and discharged home on quarantine except to seek medical care if symptoms worsen, and asked to  - Stay home and avoid contact with others until you get your results (4-5 days)  - Avoid travel on public transportation if possible (such as bus, train, or airplane) or o Sent to the Emergency Department by EMS for evaluation, COVID-19 testing, and possible  admission depending on your condition and test results.  What to do if you are LOW RISK for COVID-19?  Reduce your risk of any infection by using the same precautions used for avoiding the common cold or flu:  . Wash your hands often with soap and warm water for at least 20 seconds.  If soap and water are not readily available, use an alcohol-based hand sanitizer with at least 60% alcohol.  . If coughing or sneezing, cover your mouth and nose by coughing or sneezing into the elbow areas of your shirt or coat, into a tissue or into your sleeve (not your hands). . Avoid shaking hands with others and consider head nods or verbal greetings only. . Avoid touching your eyes, nose, or mouth with unwashed hands.  . Avoid close contact with people who are sick. . Avoid places or events with large numbers of people in one location, like concerts or sporting events. . Carefully consider travel plans you have or are making. . If you are planning any travel outside or inside the [place], visit the CDC's Travelers' Health webpage for the latest health notices. . If you have some symptoms but not all symptoms, continue to monitor at home and seek medical attention if your symptoms worsen. . If you are having a medical emergency, call 911.   ADDITIONAL HEALTHCARE OPTIONS FOR PATIENTS  [facility] / e-Visit: [email or web address]         [facility]: [phone]  [facility]: [phone]                   [facility]: [phone]   Prescreened. Neg. CM 

## 2018-11-01 ENCOUNTER — Ambulatory Visit (INDEPENDENT_AMBULATORY_CARE_PROVIDER_SITE_OTHER): Payer: Medicaid Other | Admitting: Obstetrics and Gynecology

## 2018-11-01 ENCOUNTER — Other Ambulatory Visit: Payer: Self-pay

## 2018-11-01 VITALS — BP 142/90 | HR 90 | Ht 63.0 in | Wt 165.7 lb

## 2018-11-01 DIAGNOSIS — O165 Unspecified maternal hypertension, complicating the puerperium: Secondary | ICD-10-CM

## 2018-11-01 NOTE — Progress Notes (Signed)
Pt is present today for BP check due to having elevated BP after the delivery of her baby. Pt stated that she currently taking Labetalol 200mg  for her BP and is taking the medication as scheduled and as prescribed. Pt's BP today was 142/90.

## 2018-11-15 ENCOUNTER — Other Ambulatory Visit: Payer: Self-pay

## 2018-11-15 ENCOUNTER — Encounter: Payer: Self-pay | Admitting: Obstetrics and Gynecology

## 2018-11-15 ENCOUNTER — Ambulatory Visit (INDEPENDENT_AMBULATORY_CARE_PROVIDER_SITE_OTHER): Payer: Medicaid Other | Admitting: Obstetrics and Gynecology

## 2018-11-15 DIAGNOSIS — Z3009 Encounter for other general counseling and advice on contraception: Secondary | ICD-10-CM

## 2018-11-15 DIAGNOSIS — O165 Unspecified maternal hypertension, complicating the puerperium: Secondary | ICD-10-CM

## 2018-11-15 MED ORDER — NIFEDIPINE ER OSMOTIC RELEASE 60 MG PO TB24: 60.0000 mg | ORAL_TABLET | Freq: Every day | ORAL | 0 refills | Status: DC

## 2018-11-15 MED ORDER — NORELGESTROMIN-ETH ESTRADIOL 150-35 MCG/24HR TD PTWK: 1.0000 | MEDICATED_PATCH | TRANSDERMAL | 4 refills | Status: DC

## 2018-11-15 NOTE — Progress Notes (Signed)
PT is present today for her postpartum visit. Pt stated that she is not breastfeeding and have had sexually intercourse recently w/o protection.  Pt stated that she would not like any form of birth control. EPDS=6 .  Pt stated that she is doing well no complaints. Pt stated that she is taking her BP medication as prescribed but her BP is still elevated.

## 2018-11-15 NOTE — Patient Instructions (Signed)

## 2018-11-15 NOTE — Progress Notes (Signed)
   OBSTETRICS POSTPARTUM CLINIC PROGRESS NOTE  Subjective:     Brittany Young is a 23 y.o. G53P1102 female who presents for a postpartum visit. She is 6 weeks postpartum following a repeat low cervical transverse Cesarean section. I have fully reviewed the prenatal and intrapartum course. The delivery was at 36.5 gestational weeks, due to placental abruption and preterm labor.  Anesthesia: spinal. Postpartum course has been complicated by postpartum HTN which she is currently on Procardia 30 mg daily. Baby's course has been well. Baby is feeding by formula. Bleeding: patient thinks that she has resumed her menstrual cycle (approximately 1-2 weeks ago) Bowel function is normal. Bladder function is normal. Patient is sexually active (first encounter 3 days ago). Contraception method desired is none. Postpartum depression screening: negative (EPDS = 3).  The following portions of the patient's history were reviewed and updated as appropriate: allergies, current medications, past family history, past medical history, past social history, past surgical history and problem list.  Review of Systems Pertinent items noted in HPI and remainder of comprehensive ROS otherwise negative.   Objective:    BP (!) 152/86   Pulse 90   Ht 5\' 3"  (1.6 m)   Wt 165 lb 11.2 oz (75.2 kg)   LMP 01/14/2018   Breastfeeding No   BMI 29.35 kg/m   General:  alert and no distress   Breasts:  inspection negative, no nipple discharge or bleeding, no masses or nodularity palpable  Lungs: clear to auscultation bilaterally  Heart:  regular rate and rhythm, S1, S2 normal, no murmur, click, rub or gallop  Abdomen: soft, non-tender; bowel sounds normal; no masses,  no organomegaly.  Well healed Pfannenstiel incision   Vulva:  normal  Vagina: normal vagina, no discharge, exudate, lesion, or erythema  Cervix:  no cervical motion tenderness and no lesions  Corpus: normal size, contour, position, consistency, mobility, non-tender   Adnexa:  normal adnexa and no mass, fullness, tenderness  Rectal Exam: Not performed.         Labs:  Lab Results  Component Value Date   HGB 10.7 (L) 10/24/2018     Assessment:   1. Postpartum care following cesarean delivery 2. Postpartum hypertension 3. Encounter for counseling regarding contraception  Plan:    1. Contraception: Patient currently does not desire contraception (however notes no particular reason for declining).  Does not desire another pregnancy right away. Reviewed all forms of birth control options available including abstinence; fertility period awareness methods; over the counter/barrier methods; hormonal contraceptive medication including pill, patch, ring, injection,contraceptive implant; hormonal and nonhormonal IUDs; permanent sterilization options including vasectomy and the various tubal sterilization modalities. Risks and benefits reviewed.  Questions were answered.  Information was given to patient to review. Is willing to try contraceptive patch. Will prescribe. Advised that she can start today, or wait until Sunday start.  2. Postpartum HTN, currently on 30 mg of Procardia XL. BPs still elevated. Will increase to 60 mg.  3. Follow up in: 3 months for annual exam, or sooner as needed. Will recheck BPs at that time.    Hildred Laser, MD [facility]

## 2018-12-09 ENCOUNTER — Encounter: Payer: Self-pay | Admitting: Emergency Medicine

## 2018-12-09 ENCOUNTER — Other Ambulatory Visit: Payer: Self-pay

## 2018-12-09 ENCOUNTER — Emergency Department
Admission: EM | Admit: 2018-12-09 | Discharge: 2018-12-09 | Disposition: A | Discharge status: Home / Self Care | Payer: Medicaid Other | Attending: Emergency Medicine | Admitting: Emergency Medicine

## 2018-12-09 DIAGNOSIS — R1011 Right upper quadrant pain: Secondary | ICD-10-CM | POA: Diagnosis not present

## 2018-12-09 DIAGNOSIS — Z5321 Procedure and treatment not carried out due to patient leaving prior to being seen by health care provider: Secondary | ICD-10-CM | POA: Insufficient documentation

## 2018-12-09 LAB — CBC
HCT: 36.2 % (ref 36.0–46.0)
Hemoglobin: 11.5 g/dL — ABNORMAL LOW (ref 12.0–15.0)
MCH: 25.9 pg — ABNORMAL LOW (ref 26.0–34.0)
MCHC: 31.8 g/dL (ref 30.0–36.0)
MCV: 81.5 fL (ref 80.0–100.0)
Platelets: 323 10*3/uL (ref 150–400)
RBC: 4.44 MIL/uL (ref 3.87–5.11)
RDW: 13 % (ref 11.5–15.5)
WBC: 7.4 10*3/uL (ref 4.0–10.5)
nRBC: 0 % (ref 0.0–0.2)

## 2018-12-09 LAB — COMPREHENSIVE METABOLIC PANEL
ALT: 17 U/L (ref 0–44)
AST: 19 U/L (ref 15–41)
Albumin: 4.2 g/dL (ref 3.5–5.0)
Alkaline Phosphatase: 57 U/L (ref 38–126)
Anion gap: 9 (ref 5–15)
BUN: 11 mg/dL (ref 6–20)
CO2: 25 mmol/L (ref 22–32)
Calcium: 9.1 mg/dL (ref 8.9–10.3)
Chloride: 106 mmol/L (ref 98–111)
Creatinine, Ser: 0.71 mg/dL (ref 0.44–1.00)
GFR calc Af Amer: >60 mL/min (ref >60)
GFR calc non Af Amer: >60 mL/min (ref >60)
Glucose, Bld: 100 mg/dL — ABNORMAL HIGH (ref 70–99)
Potassium: 3.2 mmol/L — ABNORMAL LOW (ref 3.5–5.1)
Sodium: 140 mmol/L (ref 135–145)
Total Bilirubin: 0.5 mg/dL (ref 0.3–1.2)
Total Protein: 8 g/dL (ref 6.5–8.1)

## 2018-12-09 LAB — LIPASE, BLOOD: Lipase: 26 U/L (ref 11–51)

## 2018-12-09 NOTE — ED Triage Notes (Signed)
Patient with complaint of intermittent right upper abdominal pain that started this morning. Patient denies nausea or vomiting.

## 2018-12-12 ENCOUNTER — Telehealth (HOSPITAL_COMMUNITY): Payer: Self-pay | Admitting: Emergency Medicine

## 2018-12-12 NOTE — Telephone Encounter (Signed)
Called patient due to lwot to inquire about condition and follow up plans. No answer and no vm

## 2019-02-15 ENCOUNTER — Encounter: Payer: Medicaid Other | Admitting: Obstetrics and Gynecology

## 2019-06-13 ENCOUNTER — Other Ambulatory Visit (HOSPITAL_COMMUNITY)
Admission: RE | Admit: 2019-06-13 | Discharge: 2019-06-13 | Disposition: A | Discharge status: Home / Self Care | Payer: Medicaid Other | Source: Ambulatory Visit | Attending: Obstetrics and Gynecology | Admitting: Obstetrics and Gynecology

## 2019-06-13 ENCOUNTER — Other Ambulatory Visit: Payer: Self-pay

## 2019-06-13 ENCOUNTER — Encounter: Payer: Self-pay | Admitting: Obstetrics and Gynecology

## 2019-06-13 ENCOUNTER — Ambulatory Visit (INDEPENDENT_AMBULATORY_CARE_PROVIDER_SITE_OTHER): Payer: Medicaid Other | Admitting: Obstetrics and Gynecology

## 2019-06-13 VITALS — BP 132/88 | HR 89 | Ht 63.0 in | Wt 153.2 lb

## 2019-06-13 DIAGNOSIS — Z0000 Encounter for general adult medical examination without abnormal findings: Secondary | ICD-10-CM | POA: Diagnosis not present

## 2019-06-13 DIAGNOSIS — Z8679 Personal history of other diseases of the circulatory system: Secondary | ICD-10-CM

## 2019-06-13 DIAGNOSIS — Z113 Encounter for screening for infections with a predominantly sexual mode of transmission: Secondary | ICD-10-CM | POA: Diagnosis not present

## 2019-06-13 DIAGNOSIS — Z01419 Encounter for gynecological examination (general) (routine) without abnormal findings: Secondary | ICD-10-CM | POA: Diagnosis not present

## 2019-06-13 DIAGNOSIS — Z8759 Personal history of other complications of pregnancy, childbirth and the puerperium: Secondary | ICD-10-CM

## 2019-06-13 DIAGNOSIS — Z3009 Encounter for other general counseling and advice on contraception: Secondary | ICD-10-CM | POA: Diagnosis not present

## 2019-06-13 NOTE — Progress Notes (Signed)
GYNECOLOGY ANNUAL PHYSICAL EXAM PROGRESS NOTE  Subjective:    Brittany Young is a 23 y.o. 532P1102 female who presents for an annual exam. The patient is sexually active.  The patient wears seatbelts: yes. The patient participates in regular exercise: no. Has the patient ever been transfused or tattooed?: yes (tattoos). The patient reports that there is not domestic violence in her life. Patient notes she discontinued her blood pressure medication (placed on meds postpartum due to HTN).   The patient has the following complaints today:  1. Patient desires a pregnancy test.  Has not used birth control that has previously been prescribed.  Patient notes she had a cycle recently but had intercourse unprotected several days later.  Does not desire pregnancy.   Gynecologic History Menarche age: [age] Patient's last menstrual period was 06/07/2019.   Contraception: none.  Was prescribed NuvaRing History of STI's: Denies Last Pap: 09/19/2018. Results were: normal.  Deniesh/o abnormal pap smears.   OB History  Gravida Para Term Preterm AB Living  2 2 1 1  0 2  SAB TAB Ectopic Multiple Live Births  0 0 0 0 2    # Outcome Date GA Lbr Len/2nd Weight Sex Delivery Anes PTL Lv  2 Preterm 09/28/18 [id number]  5 lb 1.8 oz (2.32 kg) F CS-LTranv Spinal  LIV     Name: Winfree,GIRL Henretter     Apgar1: 8  Apgar5: 9  1 Term 2013 [id number]  6 lb 12 oz (3.062 kg) M CS-LTranv   LIV     Complications: Cephalopelvic Disproportion    Obstetric Comments  G1- C-section for CPD.  Had GHTN, was induced.   G2 - placental abruption.  Postpartum HTN    Past Medical History:  Diagnosis Date  . History of cesarean section 11/03/2016  . Hypertension    With past pregnancy  . Ovarian cyst     Past Surgical History:  Procedure Laterality Date  . APPENDECTOMY    . CESAREAN SECTION N/A 09/2011  . CESAREAN SECTION N/A 09/27/2018   Procedure: CESAREAN SECTION repeat;  Surgeon: Hildred Laserherry, Luke Rigsbee, MD;  Location: [facility];   Service: Obstetrics;  Laterality: N/A;  . LAPAROSCOPIC APPENDECTOMY N/A 07/23/2015   Procedure: APPENDECTOMY LAPAROSCOPIC;  Surgeon: Lattie Hawichard E Cooper, MD;  Location: [facility];  Service: General;  Laterality: N/A;    Family History  Problem Relation Age of Onset  . GER disease Mother   . Hypertension Mother   . Cancer Maternal Grandfather   . Diabetes Maternal Grandfather   . Hypertension Maternal Grandfather     Social History   Socioeconomic History  . Marital status: Single    Spouse name: Janey Greaserntoine  . Number of children: Not on file  . Years of education: Not on file  . Highest education level: Not on file  Occupational History  . Not on file  Social Needs  . Financial resource strain: Not on file  . Food insecurity    Worry: Not on file    Inability: Not on file  . Transportation needs    Medical: Not on file    Non-medical: Not on file  Tobacco Use  . Smoking status: Former Smoker    Packs/day: 0.25    Types: Cigars  . Smokeless tobacco: Never Used  Substance and Sexual Activity  . Alcohol use: No    Alcohol/week: 0.0 standard drinks  . Drug use: Not Currently    Types: Marijuana    Comment: last used- 02/06/2018  . Sexual  activity: Yes    Birth control/protection: None  Lifestyle  . Physical activity    Days per week: Not on file    Minutes per session: Not on file  . Stress: Not on file  Relationships  . Social Herbalist on phone: Not on file    Gets together: Not on file    Attends religious service: Not on file    Active member of club or organization: Not on file    Attends meetings of clubs or organizations: Not on file    Relationship status: Not on file  . Intimate partner violence    Fear of current or ex partner: Not on file    Emotionally abused: Not on file    Physically abused: Not on file    Forced sexual activity: Not on file  Other Topics Concern  . Not on file  Social History Narrative  . Not on file    No current  outpatient medications on file prior to visit.   No current facility-administered medications on file prior to visit.     No Known Allergies    Review of Systems Constitutional: negative for chills, fatigue, fevers and sweats Eyes: negative for irritation, redness and visual disturbance Ears, nose, mouth, throat, and face: negative for hearing loss, nasal congestion, snoring and tinnitus Respiratory: negative for asthma, cough, sputum Cardiovascular: negative for chest pain, dyspnea, exertional chest pressure/discomfort, irregular heart beat, palpitations and syncope Gastrointestinal: negative for abdominal pain, change in bowel habits, nausea and vomiting Genitourinary: negative for abnormal menstrual periods, genital lesions, sexual problems and vaginal discharge, dysuria and urinary incontinence Integument/breast: negative for breast lump, breast tenderness and nipple discharge Hematologic/lymphatic: negative for bleeding and easy bruising Musculoskeletal:negative for back pain and muscle weakness Neurological: negative for dizziness, headaches, vertigo and weakness Endocrine: negative for diabetic symptoms including polydipsia, polyuria and skin dryness Allergic/Immunologic: negative for hay fever and urticaria        Objective:  Blood pressure 132/88, pulse 89, height 5\' 3"  (1.6 m), weight 153 lb 3.2 oz (69.5 kg), last menstrual period 06/07/2019, not currently breastfeeding. Body mass index is 27.14 kg/m.   General Appearance:    Alert, cooperative, no distress, appears stated age, overweight  Head:    Normocephalic, without obvious abnormality, atraumatic  Eyes:    PERRL, conjunctiva/corneas clear, EOM's intact, both eyes  Ears:    Normal external ear canals, both ears  Nose:   Nares normal, septum midline, mucosa normal, no drainage or sinus tenderness  Throat:   Lips, mucosa, and tongue normal; teeth and gums normal  Neck:   Supple, symmetrical, trachea midline, no  adenopathy; thyroid: no enlargement/tenderness/nodules; no carotid bruit or JVD  Back:     Symmetric, no curvature, ROM normal, no CVA tenderness  Lungs:     Clear to auscultation bilaterally, respirations unlabored  Chest Wall:    No tenderness or deformity   Heart:    Regular rate and rhythm, S1 and S2 normal, no murmur, rub or gallop  Breast Exam:    No tenderness, masses, or nipple abnormality  Abdomen:     Soft, non-tender, bowel sounds active all four quadrants, no masses, no organomegaly. Well-healed Pfannenstiel incision.     Genitalia:    Pelvic:external genitalia normal, vagina without lesions, or tenderness.  Small amount of thin discharge. Rectovaginal septum  normal. Cervix normal in appearance, no cervical motion tenderness, no adnexal masses or tenderness.  Uterus normal size, shape, mobile, regular contours, nontender.  Rectal:    Normal external sphincter.  No hemorrhoids appreciated. Internal exam not done.   Extremities:   Extremities normal, atraumatic, no cyanosis or edema  Pulses:   2+ and symmetric all extremities  Skin:   Skin color, texture, turgor normal, no rashes or lesions  Lymph nodes:   Cervical, supraclavicular, and axillary nodes normal  Neurologic:   CNII-XII intact, normal strength, sensation and reflexes throughout   .  Labs:  Lab Results  Component Value Date   WBC 7.4 12/09/2018   HGB 11.5 (L) 12/09/2018   HCT 36.2 12/09/2018   MCV 81.5 12/09/2018   PLT 323 12/09/2018    Lab Results  Component Value Date   CREATININE 0.71 12/09/2018   BUN 11 12/09/2018   NA 140 12/09/2018   K 3.2 (L) 12/09/2018   CL 106 12/09/2018   CO2 25 12/09/2018    Lab Results  Component Value Date   ALT 17 12/09/2018   AST 19 12/09/2018   ALKPHOS 57 12/09/2018   BILITOT 0.5 12/09/2018    No results found for: TSH   Assessment:   1. Encounter for well woman exam with routine gynecological exam   2. Encounter for counseling regarding contraception   3.  Screening for STD (sexually transmitted disease)   4. History of postpartum hypertension    Plan:     Blood tests: CBC with diff and Comprehensive metabolic panel. Breast self exam technique reviewed and patient encouraged to perform self-exam monthly. Contraception: none.  Discussed birth control options again with patient.  Notes that she really does not want to deal with hormones. Discussed non-hormonal options with patient. Given handouts. Discussed that she does not require a pregnancy test at this time as she had sex prior to her week of ovulation.  Discussed healthy lifestyle modifications. Pap smear up to date.   STD screening (cervical screening), performed today. Patient declines serology (HIV, RPR).  History of postpartum HTN, has discontinued meds. BPs stable today. Can continue to hold.     Hildred Laser, MD [facility]

## 2019-06-13 NOTE — Progress Notes (Signed)
Pt present for annual exam. Pt stated that she was doing well and requested a pregnancy test. Pt's LMP 06/07/19. Last pap 09/19/18 results negative.  Pt declined flu vaccine.

## 2019-06-13 NOTE — Patient Instructions (Addendum)
Health Maintenance, Female Adopting a healthy lifestyle and getting preventive care are important in promoting health and wellness. Ask your health care provider about:  The right schedule for you to have regular tests and exams.  Things you can do on your own to prevent diseases and keep yourself healthy. What should I know about diet, weight, and exercise? Eat a healthy diet   Eat a diet that includes plenty of vegetables, fruits, low-fat dairy products, and lean protein.  Do not eat a lot of foods that are high in solid fats, added sugars, or sodium. Maintain a healthy weight Body mass index (BMI) is used to identify weight problems. It estimates body fat based on height and weight. Your health care provider can help determine your BMI and help you achieve or maintain a healthy weight. Get regular exercise Get regular exercise. This is one of the most important things you can do for your health. Most adults should:  Exercise for at least 150 minutes each week. The exercise should increase your heart rate and make you sweat (moderate-intensity exercise).  Do strengthening exercises at least twice a week. This is in addition to the moderate-intensity exercise.  Spend less time sitting. Even light physical activity can be beneficial. Watch cholesterol and blood lipids Have your blood tested for lipids and cholesterol at 23 years of age, then have this test every 5 years. Have your cholesterol levels checked more often if:  Your lipid or cholesterol levels are high.  You are older than 23 years of age.  You are at high risk for heart disease. What should I know about cancer screening? Depending on your health history and family history, you may need to have cancer screening at various ages. This may include screening for:  Breast cancer.  Cervical cancer.  Colorectal cancer.  Skin cancer.  Lung cancer. What should I know about heart disease, diabetes, and high blood  pressure? Blood pressure and heart disease  High blood pressure causes heart disease and increases the risk of stroke. This is more likely to develop in people who have high blood pressure readings, are of African descent, or are overweight.  Have your blood pressure checked: ? Every 3-5 years if you are 18-39 years of age. ? Every year if you are 40 years old or older. Diabetes Have regular diabetes screenings. This checks your fasting blood sugar level. Have the screening done:  Once every three years after age 40 if you are at a normal weight and have a low risk for diabetes.  More often and at a younger age if you are overweight or have a high risk for diabetes. What should I know about preventing infection? Hepatitis B If you have a higher risk for hepatitis B, you should be screened for this virus. Talk with your health care provider to find out if you are at risk for hepatitis B infection. Hepatitis C Testing is recommended for:  Everyone born from 1945 through 1965.  Anyone with known risk factors for hepatitis C. Sexually transmitted infections (STIs)  Get screened for STIs, including gonorrhea and chlamydia, if: ? You are sexually active and are younger than 24 years of age. ? You are older than 24 years of age and your health care provider tells you that you are at risk for this type of infection. ? Your sexual activity has changed since you were last screened, and you are at increased risk for chlamydia or gonorrhea. Ask your health care provider if   you are at risk.  Ask your health care provider about whether you are at high risk for HIV. Your health care provider may recommend a prescription medicine to help prevent HIV infection. If you choose to take medicine to prevent HIV, you should first get tested for HIV. You should then be tested every 3 months for as long as you are taking the medicine. Pregnancy  If you are about to stop having your period (premenopausal) and  you may become pregnant, seek counseling before you get pregnant.  Take 400 to 800 micrograms (mcg) of folic acid every day if you become pregnant.  Ask for birth control (contraception) if you want to prevent pregnancy. Osteoporosis and menopause Osteoporosis is a disease in which the bones lose minerals and strength with aging. This can result in bone fractures. If you are 36 years old or older, or if you are at risk for osteoporosis and fractures, ask your health care provider if you should:  Be screened for bone loss.  Take a calcium or vitamin D supplement to lower your risk of fractures.  Be given hormone replacement therapy (HRT) to treat symptoms of menopause. Follow these instructions at home: Lifestyle  Do not use any products that contain nicotine or tobacco, such as cigarettes, e-cigarettes, and chewing tobacco. If you need help quitting, ask your health care provider.  Do not use street drugs.  Do not share needles.  Ask your health care provider for help if you need support or information about quitting drugs. Alcohol use  Do not drink alcohol if: ? Your health care provider tells you not to drink. ? You are pregnant, may be pregnant, or are planning to become pregnant.  If you drink alcohol: ? Limit how much you use to 0-1 drink a day. ? Limit intake if you are breastfeeding.  Be aware of how much alcohol is in your drink. In the [place] one drink equals one 12 oz bottle of beer (355 mL), one 5 oz glass of wine (148 mL), or one 1 oz glass of hard liquor (44 mL). General instructions  Schedule regular health, dental, and eye exams.  Stay current with your vaccines.  Tell your health care provider if: ? You often feel depressed. ? You have ever been abused or do not feel safe at home. Summary  Adopting a healthy lifestyle and getting preventive care are important in promoting health and wellness.  Follow your health care provider's instructions about healthy  diet, exercising, and getting tested or screened for diseases.  Follow your health care provider's instructions on monitoring your cholesterol and blood pressure. This information is not intended to replace advice given to you by your health care provider. Make sure you discuss any questions you have with your health care provider. Document Released: 01/04/2011 Document Revised: 06/14/2018 Document Reviewed: 06/14/2018 Elsevier Patient Education  2020 Elsevier Inc.  Breast Self-Awareness Breast self-awareness is knowing how your breasts look and feel. Doing breast self-awareness is important. It allows you to catch a breast problem early while it is still small and can be treated. All women should do breast self-awareness, including women who have had breast implants. Tell your doctor if you notice a change in your breasts. What you need:  A mirror.  A well-lit room. How to do a breast self-exam A breast self-exam is one way to learn what is normal for your breasts and to check for changes. To do a breast self-exam: Look for changes  1. Take  off all the clothes above your waist. 2. Stand in front of a mirror in a room with good lighting. 3. Put your hands on your hips. 4. Push your hands down. 5. Look at your breasts and nipples in the mirror to see if one breast or nipple looks different from the other. Check to see if: ? The shape of one breast is different. ? The size of one breast is different. ? There are wrinkles, dips, and bumps in one breast and not the other. 6. Look at each breast for changes in the skin, such as: ? Redness. ? Scaly areas. 7. Look for changes in your nipples, such as: ? Liquid around the nipples. ? Bleeding. ? Dimpling. ? Redness. ? A change in where the nipples are. Feel for changes  1. Lie on your back on the floor. 2. Feel each breast. To do this, follow these steps: ? Pick a breast to feel. ? Put the arm closest to that breast above your head.  ? Use your other arm to feel the nipple area of your breast. Feel the area with the pads of your three middle fingers by making small circles with your fingers. For the first circle, press lightly. For the second circle, press harder. For the third circle, press even harder. ? Keep making circles with your fingers at the different pressures as you move down your breast. Stop when you feel your ribs. ? Move your fingers a little toward the center of your body. ? Start making circles with your fingers again, this time going up until you reach your collarbone. ? Keep making up-and-down circles until you reach your armpit. Remember to keep using the three pressures. ? Feel the other breast in the same way. 3. Sit or stand in the tub or shower. 4. With soapy water on your skin, feel each breast the same way you did in step 2 when you were lying on the floor. Write down what you find Writing down what you find can help you remember what to tell your doctor. Write down:  What is normal for each breast.  Any changes you find in each breast, including: ? The kind of changes you find. ? Whether you have pain. ? Size and location of any lumps.  When you last had your menstrual period. General tips  Check your breasts every month.  If you are breastfeeding, the best time to check your breasts is after you feed your baby or after you use a breast pump.  If you get menstrual periods, the best time to check your breasts is 5-7 days after your menstrual period is over.  With time, you will become comfortable with the self-exam, and you will begin to know if there are changes in your breasts. Contact a doctor if you:  See a change in the shape or size of your breasts or nipples.  See a change in the skin of your breast or nipples, such as red or scaly skin.  Have fluid coming from your nipples that is not normal.  Find a lump or thick area that was not there before.  Have pain in your breasts.   Have any concerns about your breast health. Summary  Breast self-awareness includes looking for changes in your breasts, as well as feeling for changes within your breasts.  Breast self-awareness should be done in front of a mirror in a well-lit room.  You should check your breasts every month. If you get menstrual periods,  the best time to check your breasts is 5-7 days after your menstrual period is over.  Let your doctor know of any changes you see in your breasts, including changes in size, changes on the skin, pain or tenderness, or fluid from your nipples that is not normal. This information is not intended to replace advice given to you by your health care provider. Make sure you discuss any questions you have with your health care provider. Document Released: 12/08/2007 Document Revised: 02/07/2018 Document Reviewed: 02/07/2018 Elsevier Patient Education  2020 [employer].   Contraception Choices Contraception, also called birth control, means things to use or ways to try not to get pregnant. Hormonal birth control This kind of birth control uses hormones. Here are some types of hormonal birth control:  A tube that is put under skin of the arm (implant). The tube can stay in for as long as 3 years.  Shots to get every 3 months (injections).  Pills to take every day (birth control pills).  A patch to change 1 time each week for 3 weeks (birth control patch). After that, the patch is taken off for 1 week.  A ring to put in the vagina. The ring is left in for 3 weeks. Then it is taken out of the vagina for 1 week. Then a new ring is put in.  Pills to take after unprotected sex (emergency birth control pills). Barrier birth control Here are some types of barrier birth control:  A thin covering that is put on the penis before sex (female condom). The covering is thrown away after sex.  A soft, loose covering that is put in the vagina before sex (female condom). The covering  is thrown away after sex.  A rubber bowl that sits over the cervix (diaphragm). The bowl must be made for you. The bowl is put into the vagina before sex. The bowl is left in for 6-8 hours after sex. It is taken out within 24 hours.  A small, soft cup that fits over the cervix (cervical cap). The cup must be made for you. The cup can be left in for 6-8 hours after sex. It is taken out within 48 hours.  A sponge that is put into the vagina before sex. It must be left in for at least 6 hours after sex. It must be taken out within 30 hours. Then it is thrown away.  A chemical that kills or stops sperm from getting into the uterus (spermicide). It may be a pill, cream, jelly, or foam to put in the vagina. The chemical should be used at least 10-15 minutes before sex. IUD (intrauterine) birth control An IUD is a small, T-shaped piece of plastic. It is put inside the uterus. There are two kinds:  Hormone IUD. This kind can stay in for 3-5 years.  Copper IUD. This kind can stay in for 10 years. Permanent birth control Here are some types of permanent birth control:  Surgery to block the fallopian tubes.  Having an insert put into each fallopian tube.  Surgery to tie off the tubes that carry sperm (vasectomy). Natural planning birth control Here are some types of natural planning birth control:  Not having sex on the days the woman could get pregnant.  Using a calendar: ? To keep track of the length of each period. ? To find out what days pregnancy can happen. ? To plan to not have sex on days when pregnancy can happen.  Watching for  symptoms of ovulation and not having sex during ovulation. One way the woman can check for ovulation is to check her temperature.  Waiting to have sex until after ovulation. Summary  Contraception, also called birth control, means things to use or ways to try not to get pregnant.  Hormonal methods of birth control include implants, injections, pills,  patches, vaginal rings, and emergency birth control pills.  Barrier methods of birth control can include female condoms, female condoms, diaphragms, cervical caps, sponges, and spermicides.  There are two types of IUD (intrauterine device) birth control. An IUD can be put in a woman's uterus to prevent pregnancy for 3-5 years.  Permanent sterilization can be done through a procedure for males, females, or both.  Natural planning methods involve not having sex on the days when the woman could get pregnant. This information is not intended to replace advice given to you by your health care provider. Make sure you discuss any questions you have with your health care provider. Document Released: 04/18/2009 Document Revised: 10/11/2018 Document Reviewed: 07/01/2016 Elsevier Patient Education  2020 [employer]

## 2019-06-14 LAB — COMPREHENSIVE METABOLIC PANEL
ALT: 10 IU/L (ref 0–32)
AST: 13 IU/L (ref 0–40)
Albumin/Globulin Ratio: 1.4 (ref 1.2–2.2)
Albumin: 4.3 g/dL (ref 3.9–5.0)
Alkaline Phosphatase: 73 IU/L (ref 39–117)
BUN/Creatinine Ratio: 10 (ref 9–23)
BUN: 7 mg/dL (ref 6–20)
Bilirubin Total: 0.2 mg/dL (ref 0.0–1.2)
CO2: 26 mmol/L (ref 20–29)
Calcium: 9.2 mg/dL (ref 8.7–10.2)
Chloride: 105 mmol/L (ref 96–106)
Creatinine, Ser: 0.73 mg/dL (ref 0.57–1.00)
GFR calc Af Amer: 134 mL/min/{1.73_m2} (ref >59)
GFR calc non Af Amer: 116 mL/min/{1.73_m2} (ref >59)
Globulin, Total: 3.1 g/dL (ref 1.5–4.5)
Glucose: 84 mg/dL (ref 65–99)
Potassium: 3.7 mmol/L (ref 3.5–5.2)
Sodium: 141 mmol/L (ref 134–144)
Total Protein: 7.4 g/dL (ref 6.0–8.5)

## 2019-06-14 LAB — CBC
Hematocrit: 36.8 % (ref 34.0–46.6)
Hemoglobin: 12.1 g/dL (ref 11.1–15.9)
MCH: 26.9 pg (ref 26.6–33.0)
MCHC: 32.9 g/dL (ref 31.5–35.7)
MCV: 82 fL (ref 79–97)
Platelets: 298 10*3/uL (ref 150–450)
RBC: 4.49 x10E6/uL (ref 3.77–5.28)
RDW: 13.4 % (ref 11.7–15.4)
WBC: 6.2 10*3/uL (ref 3.4–10.8)

## 2019-06-15 LAB — CERVICOVAGINAL ANCILLARY ONLY
Bacterial Vaginitis (gardnerella): POSITIVE — AB
Candida Glabrata: NEGATIVE
Candida Vaginitis: NEGATIVE
Chlamydia: NEGATIVE
Comment: NEGATIVE
Comment: NEGATIVE
Comment: NEGATIVE
Comment: NEGATIVE
Comment: NEGATIVE
Comment: NORMAL
Neisseria Gonorrhea: NEGATIVE
Trichomonas: POSITIVE — AB

## 2019-06-15 MED ORDER — METRONIDAZOLE 500 MG PO TABS: 500.0000 mg | ORAL_TABLET | Freq: Two times a day (BID) | ORAL | 1 refills | Status: AC

## 2019-06-16 ENCOUNTER — Encounter: Payer: Self-pay | Admitting: Obstetrics and Gynecology

## 2019-07-24 ENCOUNTER — Ambulatory Visit: Payer: Self-pay | Admitting: Obstetrics and Gynecology

## 2019-07-28 IMAGING — CR DG CHEST 2V
1 series · 2 of 2 positions shown · non-contrast
Comparison: 08/17/2018

CLINICAL DATA: Fever, cough

EXAM:
CHEST - 2 VIEW

[Series 1: dg chest 2 view · 0.14mm/px · 2 of 2 slices shown]
[im 1/2]
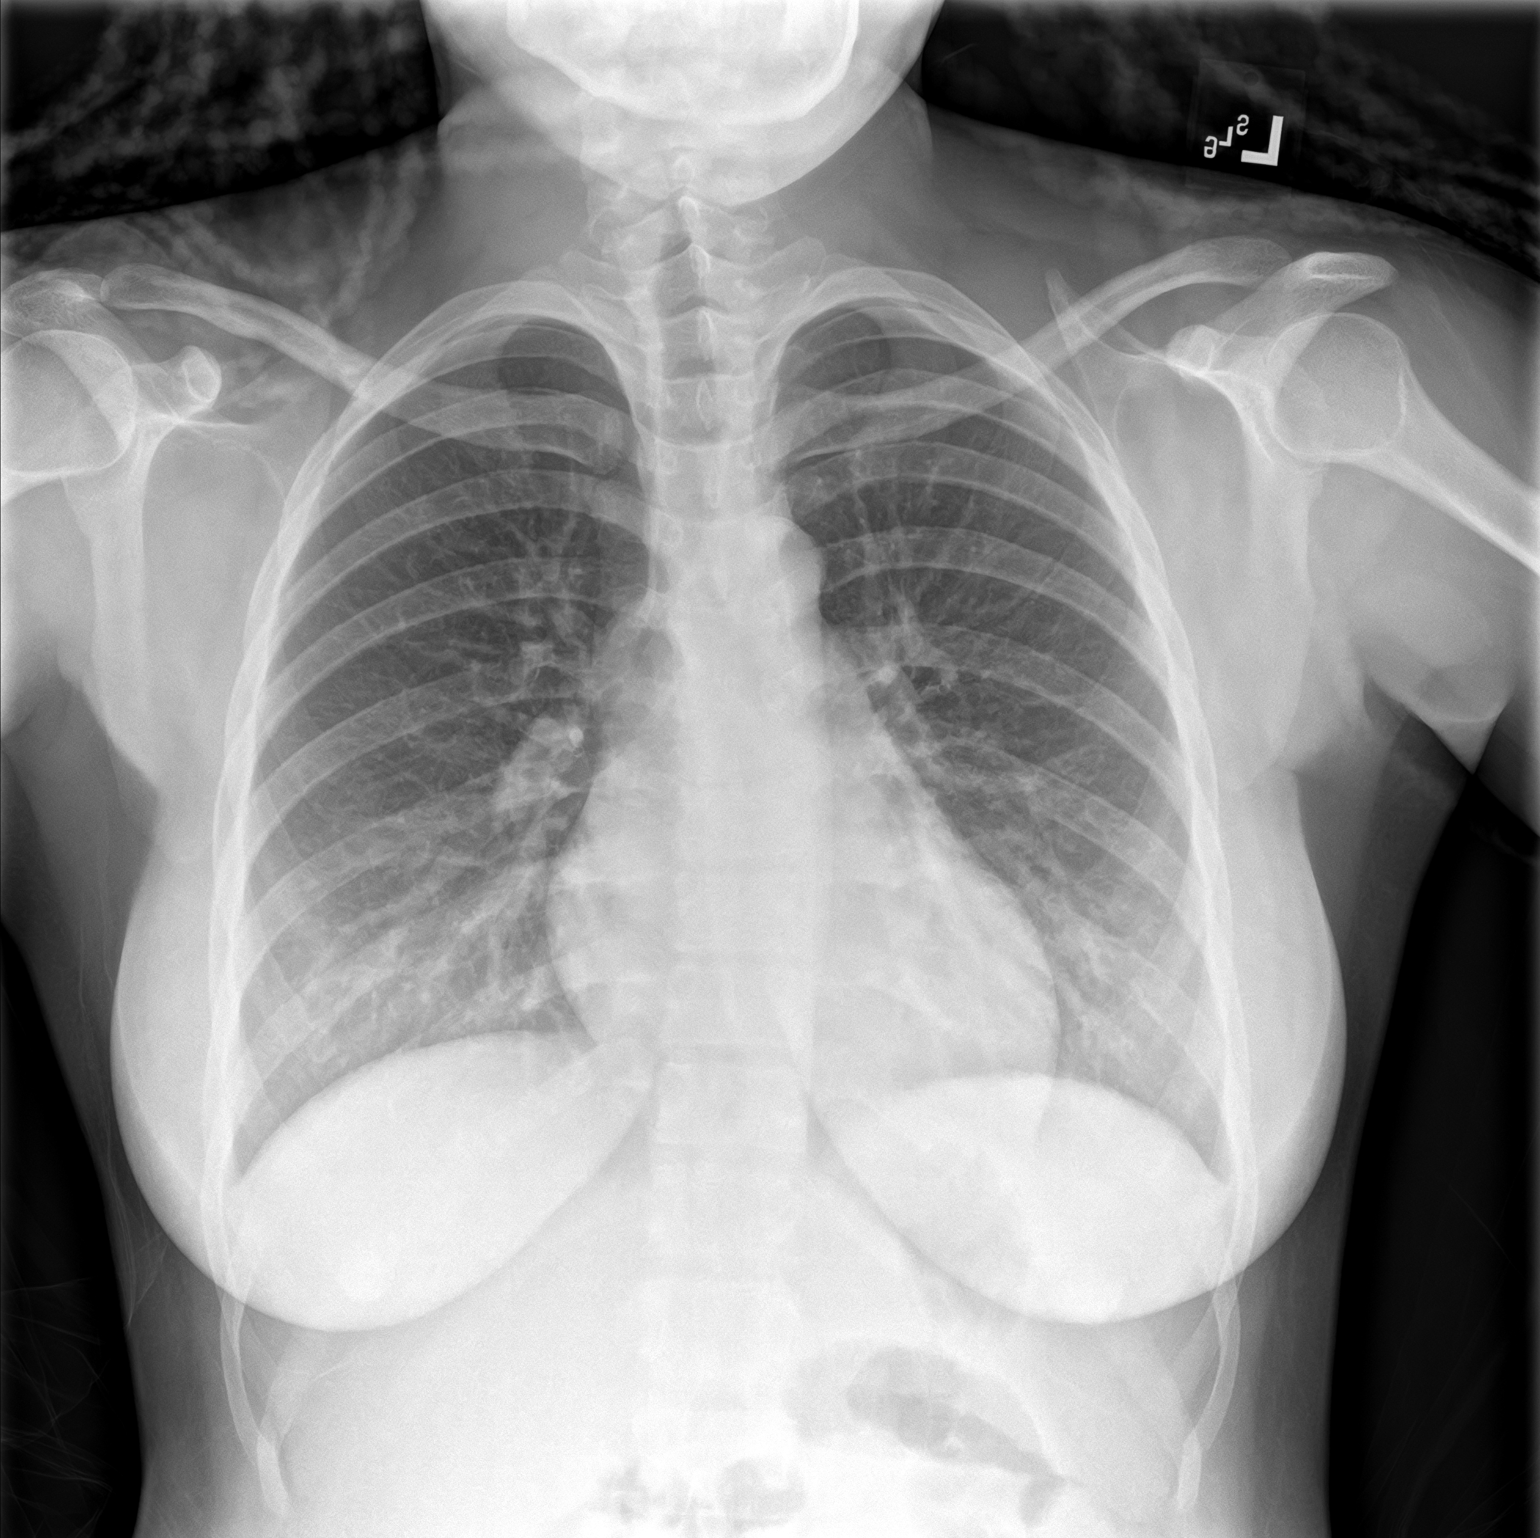
[im 2/2]
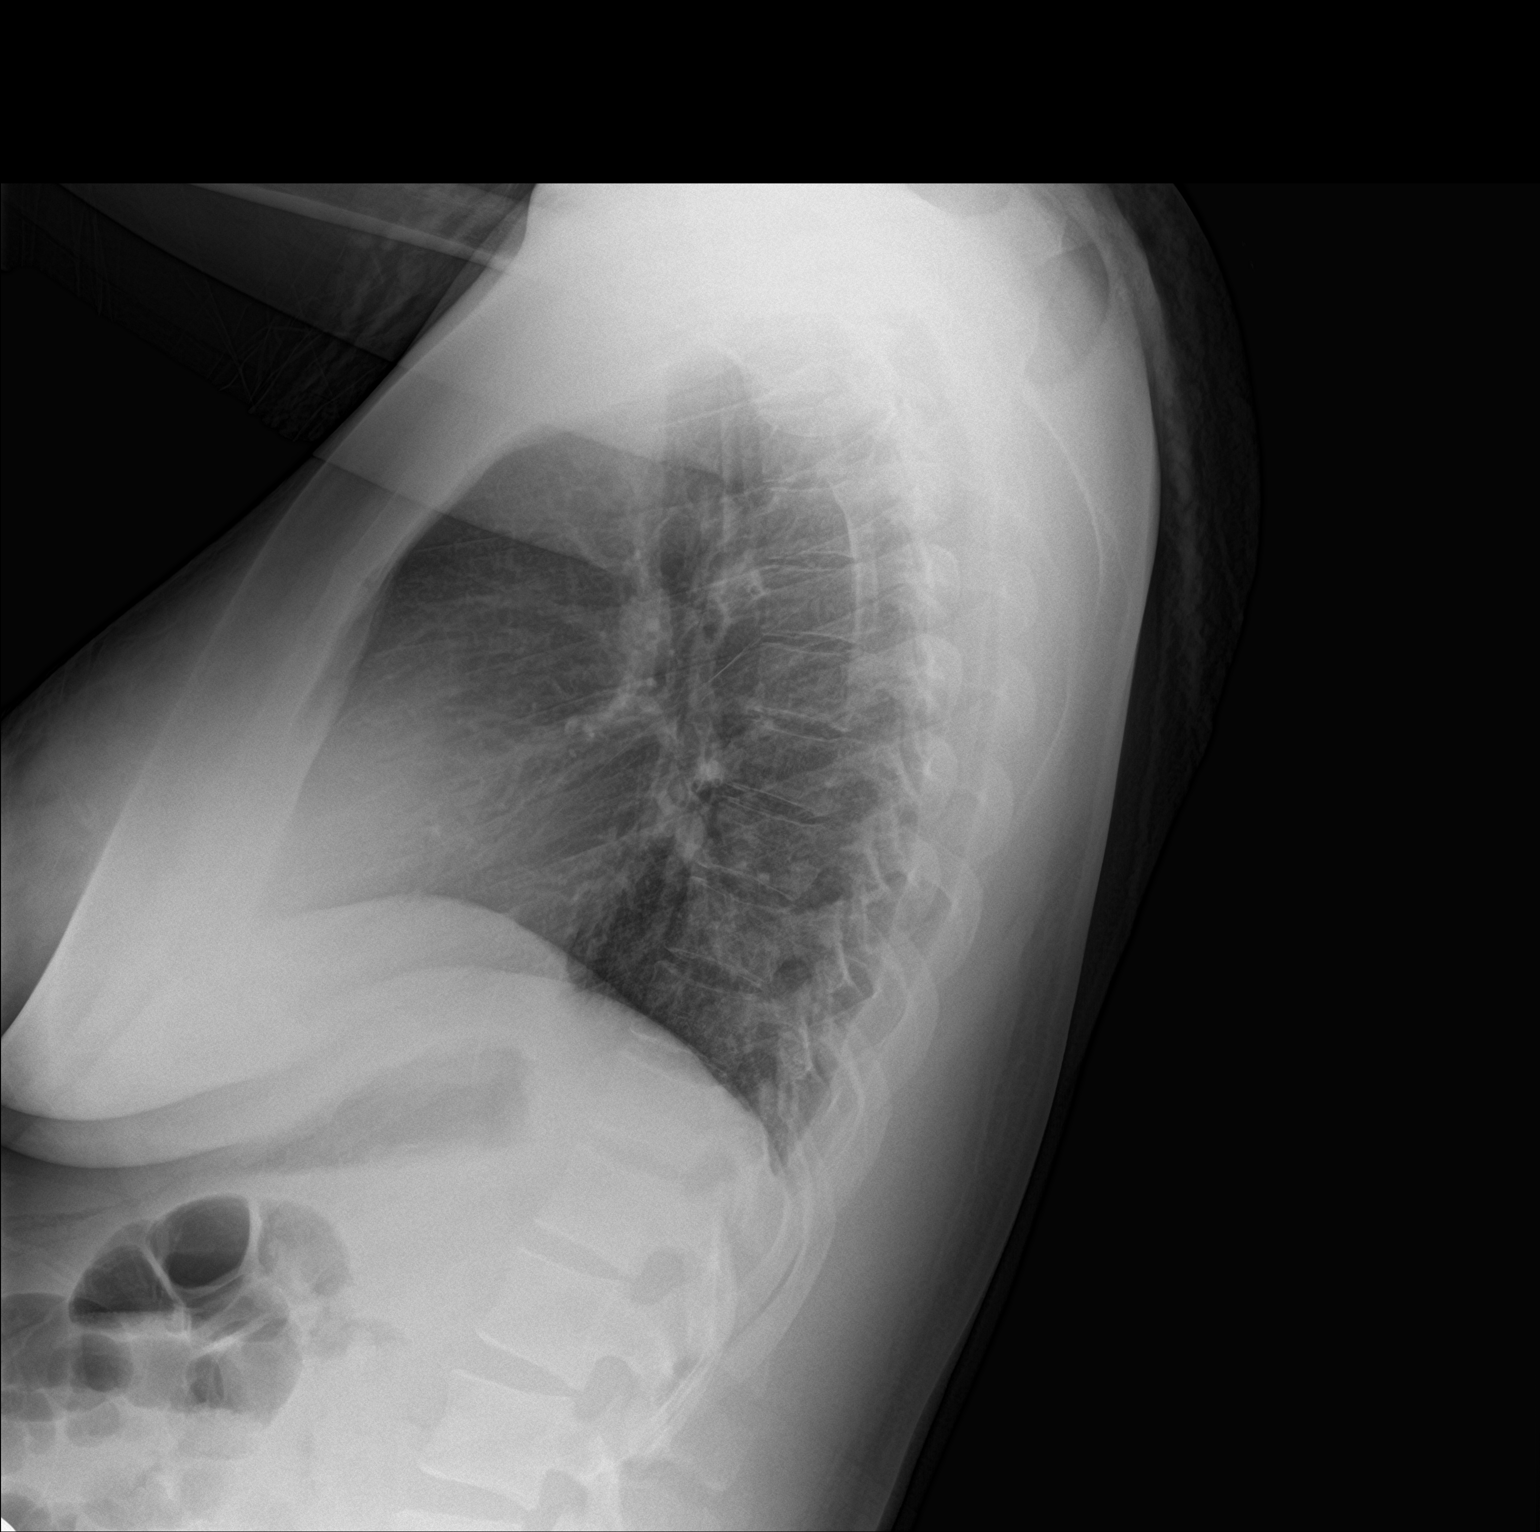

[2 of 2 positions shown; findings below may reference images not displayed]

FINDINGS: Patchy bilateral lower lobe airspace opacities noted concerning for
pneumonia. Heart is normal size. No effusions or acute bony
abnormality.
IMPRESSION: Vague patchy airspace opacities in the lower lobes concerning for
pneumonia.

## 2019-08-28 ENCOUNTER — Encounter: Payer: Self-pay | Admitting: Emergency Medicine

## 2019-08-28 ENCOUNTER — Emergency Department
Admission: EM | Admit: 2019-08-28 | Discharge: 2019-08-28 | Disposition: A | Discharge status: Home / Self Care | Payer: Medicaid Other | Attending: Emergency Medicine | Admitting: Emergency Medicine

## 2019-08-28 ENCOUNTER — Other Ambulatory Visit: Payer: Self-pay

## 2019-08-28 DIAGNOSIS — J029 Acute pharyngitis, unspecified: Secondary | ICD-10-CM | POA: Diagnosis not present

## 2019-08-28 DIAGNOSIS — I10 Essential (primary) hypertension: Secondary | ICD-10-CM | POA: Diagnosis not present

## 2019-08-28 DIAGNOSIS — F17210 Nicotine dependence, cigarettes, uncomplicated: Secondary | ICD-10-CM | POA: Diagnosis not present

## 2019-08-28 LAB — GROUP A STREP BY PCR: Group A Strep by PCR: NOT DETECTED

## 2019-08-28 MED ORDER — AMOXICILLIN-POT CLAVULANATE 875-125 MG PO TABS: 1.0000 | ORAL_TABLET | Freq: Two times a day (BID) | ORAL | 0 refills | Status: AC

## 2019-08-28 NOTE — ED Notes (Signed)
Pt reporting sore throat with pain mostly located on the left side of throat x 2-3 weeks. Pt grimacing while swallowing but otherwise appears to be in NAD. No fevers. Small white patched noted on right side of throat.   Warm blanket given, lights dimmed, call bell in reach, remote provided and patient updated on testing time.

## 2019-08-28 NOTE — ED Triage Notes (Signed)
Pt to ED from home c/o sore throat x2 days.  States hurts to swallow.  Speaking in complete and coherent sentences, no obvious swelling, in NAD at this time.

## 2019-08-28 NOTE — ED Provider Notes (Signed)
[facility] Emergency Department Provider Note  ____________________________________________   First MD Initiated Contact with Patient 08/28/19 0221     (approximate)  I have reviewed the triage vital signs and the nursing notes.   HISTORY  Chief Complaint Sore Throat    HPI Brittany Young is a 24 y.o. female presents to the emergency department secondary to a 3-day history of sore throat.  Patient denies any fever no cough.  Patient states discomfort with swallowing.       Past Medical History:  Diagnosis Date  . History of cesarean section 11/03/2016  . Hypertension    With past pregnancy  . Ovarian cyst     Patient Active Problem List   Diagnosis Date Noted  . Preterm labor in third trimester 09/28/2018  . Non-reassuring fetal heart rate or rhythm affecting management of mother 09/28/2018  . History of cesarean delivery affecting pregnancy 09/27/2018  . Indication for care in labor and delivery, antepartum 09/21/2018  . Abnormal findings on prenatal screening   . History of parvovirus B19 infection 04/04/2018  . Maternal varicella, non-immune 04/04/2018  . History of cesarean section 11/03/2016  . History of pregnancy induced hypertension 11/03/2016    Past Surgical History:  Procedure Laterality Date  . APPENDECTOMY    . CESAREAN SECTION N/A 09/2011  . CESAREAN SECTION N/A 09/27/2018   Procedure: CESAREAN SECTION repeat;  Surgeon: Hildred Laser, MD;  Location: [facility] ORS;  Service: Obstetrics;  Laterality: N/A;  . LAPAROSCOPIC APPENDECTOMY N/A 07/23/2015   Procedure: APPENDECTOMY LAPAROSCOPIC;  Surgeon: Lattie Haw, MD;  Location: [facility] ORS;  Service: General;  Laterality: N/A;    Prior to Admission medications   Not on File    Allergies Patient has no known allergies.  Family History  Problem Relation Age of Onset  . GER disease Mother   . Hypertension Mother   . Cancer Maternal Grandfather   . Diabetes Maternal  Grandfather   . Hypertension Maternal Grandfather     Social History Social History   Tobacco Use  . Smoking status: Current Every Day Smoker    Packs/day: 0.25    Types: Cigars  . Smokeless tobacco: Never Used  . Tobacco comment: smoke black and milds  Substance Use Topics  . Alcohol use: No    Alcohol/week: 0.0 standard drinks  . Drug use: Not Currently    Types: Marijuana    Comment: last used- 02/06/2018    Review of Systems Constitutional: No fever/chills Eyes: No visual changes. ENT: Positive for sore throat. Cardiovascular: Denies chest pain. Respiratory: Denies shortness of breath. Gastrointestinal: No abdominal pain.  No nausea, no vomiting.  No diarrhea.  No constipation. Genitourinary: Negative for dysuria. Musculoskeletal: Negative for neck pain.  Negative for back pain. Integumentary: Negative for rash. Neurological: Negative for headaches, focal weakness or numbness.  ____________________________________________   PHYSICAL EXAM:  VITAL SIGNS: ED Triage Vitals [08/28/19 0110]  Enc Vitals Group     BP (!) 140/93     Pulse Rate 77     Resp 14     Temp 98.7 F (37.1 C)     Temp Source Oral     SpO2 100 %     Weight 71.2 kg (157 lb)     Height 1.676 m (5\' 6" )     Head Circumference      Peak Flow      Pain Score 7     Pain Loc      Pain Edu?  Excl. in [place]?     Constitutional: Alert and oriented.  Eyes: Conjunctivae are normal.  Mouth/Throat: Pharyngeal erythema without exudate Neck: No stridor.  No meningeal signs.   Cardiovascular: Normal rate, regular rhythm. Good peripheral circulation. Grossly normal heart sounds. Respiratory: Normal respiratory effort.  No retractions. Gastrointestinal: Soft and nontender. No distention.  Musculoskeletal: No lower extremity tenderness nor edema. No gross deformities of extremities. Neurologic:  Normal speech and language. No gross focal neurologic deficits are appreciated.  Skin:  Skin is warm, dry and  intact. Psychiatric: Mood and affect are normal. Speech and behavior are normal.  ____________________________________________   LABS (all labs ordered are listed, but only abnormal results are displayed)  Labs Reviewed  GROUP A STREP BY PCR      Procedures   ____________________________________________   INITIAL IMPRESSION / MDM / [place] / ED COURSE  As part of my medical decision making, I reviewed the following data within the [place] \  24 year old female presented with above-stated history and physical exam secondary to sore throat with differential diagnosis including but not limited to pharyngitis strep throat tonsillitis.  Clinical exam not consistent with strep throat and rapid strep negative.  I informed the patient that this was more than likely viral.  Antibiotics were prescribed and patient was informed to not fill the prescription unless symptoms persist for greater than 5 days.  ____________________________________________  FINAL CLINICAL IMPRESSION(S) / ED DIAGNOSES  Final diagnoses:  Pharyngitis, unspecified etiology     MEDICATIONS GIVEN DURING THIS VISIT:  Medications - No data to display   ED Discharge Orders    None      *Please note:  Brittany Young was evaluated in Emergency Department on 08/28/2019 for the symptoms described in the history of present illness. She was evaluated in the context of the global COVID-19 pandemic, which necessitated consideration that the patient might be at risk for infection with the SARS-CoV-2 virus that causes COVID-19. Institutional protocols and algorithms that pertain to the evaluation of patients at risk for COVID-19 are in a state of rapid change based on information released by regulatory bodies including the CDC and federal and state organizations. These policies and algorithms were followed during the patient's care in the ED.  Some ED evaluations and interventions may be  delayed as a result of limited staffing during the pandemic.*  Note:  This document was prepared using Dragon voice recognition software and may include unintentional dictation errors.   Gregor Hams, MD 08/28/19 [phone]

## 2019-12-06 ENCOUNTER — Other Ambulatory Visit: Payer: Self-pay

## 2019-12-06 ENCOUNTER — Ambulatory Visit (INDEPENDENT_AMBULATORY_CARE_PROVIDER_SITE_OTHER): Payer: Medicaid Other | Admitting: Obstetrics and Gynecology

## 2019-12-06 ENCOUNTER — Encounter: Payer: Self-pay | Admitting: Obstetrics and Gynecology

## 2019-12-06 VITALS — BP 134/86 | HR 83 | Ht 66.0 in | Wt 152.2 lb

## 2019-12-06 DIAGNOSIS — N6011 Diffuse cystic mastopathy of right breast: Secondary | ICD-10-CM | POA: Insufficient documentation

## 2019-12-06 DIAGNOSIS — N644 Mastodynia: Secondary | ICD-10-CM | POA: Diagnosis not present

## 2019-12-06 NOTE — Progress Notes (Signed)
    GYNECOLOGY CLINIC PROGRESS NOTE Subjective:     Brittany Young is an 24 y.o. [phone] female who presents for evaluation of a breast tenderness in the right. Symptoms have been ongoing for ~ 1 week. Denies leaking from the breast, or any masses.  Denies any alleviating or aggravating factors. Has minimal caffeine intake. Has not tried anything to manage the pain. Denies family or personal history of breast disease or cancer.   The following portions of the patient's history were reviewed and updated as appropriate: allergies, current medications, past family history, past medical history, past social history, past surgical history and problem list.  Review of Systems A comprehensive review of systems was negative except for: what is noted in HPI     Objective:    BP 134/86   Pulse 83   Ht 5\' 6"  (1.676 m)   Wt 152 lb 3.2 oz (69 kg)   LMP 11/17/2019   BMI 24.57 kg/m  General appearance: alert and no distress Breasts: normal appearance, no masses or tenderness, No nipple retraction or dimpling, No nipple discharge or bleeding, No axillary or supraclavicular adenopathy, negative findings: left breast normal to palpation, positive findings: fibrocystic changes of the right breast.    Assessment:    mastalgia, likely due to fibrocystic changes    Plan:    Discussed fibrocystic disease and alleviating measures. Reassured the patient that the finding is most likely benign. Follow up in 2-3 weeks should symptoms not improve.     11/19/2019, MD [facility]

## 2019-12-06 NOTE — Progress Notes (Signed)
Pt present for today due to having right breast pain. Pt state she noticed the pain in her right breast about a week ago.

## 2019-12-06 NOTE — Patient Instructions (Signed)

## 2019-12-18 ENCOUNTER — Telehealth: Payer: Self-pay | Admitting: Obstetrics and Gynecology

## 2019-12-18 DIAGNOSIS — N6011 Diffuse cystic mastopathy of right breast: Secondary | ICD-10-CM

## 2019-12-18 NOTE — Telephone Encounter (Signed)
Pt called in and stated that her right breast is still hurting and she was advised to call back after her period. The pt stated that she was told she may need a breast u/s. I told the pt we will need an order from the provider and that I will send a message and we will be in touch. Please advise

## 2019-12-19 NOTE — Telephone Encounter (Signed)
Pt called in and stated that she call yesterday and no one has called her back. I told the pt that we did have training yesterday and the office was closed from 1-5. I informed the pt that the message was sent and the nurse has 24 to 48 hours to return you call. The pt verbally understood. Please advise

## 2019-12-21 NOTE — Telephone Encounter (Signed)
Spoke with pt and stated that her breast is still hurting and needs a order place for a breast u/s or mammogram. Pt was informed that a message would be sent to [facility] to see which order is best for her. Pt was informed that she would receive a call back when the order has been placed. Pt stated that she understood.

## 2019-12-26 NOTE — Addendum Note (Signed)
Addended by: Silvano Bilis on: 12/26/2019 03:17 PM   Modules accepted: Orders

## 2019-12-26 NOTE — Telephone Encounter (Signed)
Spoke with pt concerning her breast pain and issues. Pt stated that she has tried all of the suggestion given by [facility] and now there is another area of the breast that is painful and causing her issues. Pt stated now her right breast on the right side is hurting. Order was placed for patient to get a ultrasound for pain in right breast.

## 2020-01-25 ENCOUNTER — Telehealth: Payer: Self-pay | Admitting: Obstetrics and Gynecology

## 2020-01-25 ENCOUNTER — Emergency Department: Payer: Medicaid Other

## 2020-01-25 ENCOUNTER — Emergency Department
Admission: EM | Admit: 2020-01-25 | Discharge: 2020-01-25 | Disposition: A | Discharge status: Home / Self Care | Payer: Medicaid Other | Attending: Emergency Medicine | Admitting: Emergency Medicine

## 2020-01-25 ENCOUNTER — Other Ambulatory Visit: Payer: Self-pay

## 2020-01-25 DIAGNOSIS — Z3A00 Weeks of gestation of pregnancy not specified: Secondary | ICD-10-CM | POA: Insufficient documentation

## 2020-01-25 DIAGNOSIS — O209 Hemorrhage in early pregnancy, unspecified: Secondary | ICD-10-CM | POA: Insufficient documentation

## 2020-01-25 LAB — BASIC METABOLIC PANEL
Anion gap: 6 (ref 5–15)
BUN: 8 mg/dL (ref 6–20)
CO2: 26 mmol/L (ref 22–32)
Calcium: 9 mg/dL (ref 8.9–10.3)
Chloride: 106 mmol/L (ref 98–111)
Creatinine, Ser: 0.69 mg/dL (ref 0.44–1.00)
GFR calc Af Amer: >60 mL/min (ref >60)
GFR calc non Af Amer: >60 mL/min (ref >60)
Glucose, Bld: 94 mg/dL (ref 70–99)
Potassium: 3.8 mmol/L (ref 3.5–5.1)
Sodium: 138 mmol/L (ref 135–145)

## 2020-01-25 LAB — CBC WITH DIFFERENTIAL/PLATELET
Abs Immature Granulocytes: 0.01 10*3/uL (ref 0.00–0.07)
Basophils Absolute: 0 10*3/uL (ref 0.0–0.1)
Basophils Relative: 1 %
Eosinophils Absolute: 0.1 10*3/uL (ref 0.0–0.5)
Eosinophils Relative: 2 %
HCT: 34.4 % — ABNORMAL LOW (ref 36.0–46.0)
Hemoglobin: 11.2 g/dL — ABNORMAL LOW (ref 12.0–15.0)
Immature Granulocytes: 0 %
Lymphocytes Relative: 19 %
Lymphs Abs: 1.3 10*3/uL (ref 0.7–4.0)
MCH: 27.7 pg (ref 26.0–34.0)
MCHC: 32.6 g/dL (ref 30.0–36.0)
MCV: 84.9 fL (ref 80.0–100.0)
Monocytes Absolute: 0.5 10*3/uL (ref 0.1–1.0)
Monocytes Relative: 7 %
Neutro Abs: 4.7 10*3/uL (ref 1.7–7.7)
Neutrophils Relative %: 71 %
Platelets: 251 10*3/uL (ref 150–400)
RBC: 4.05 MIL/uL (ref 3.87–5.11)
RDW: 12.5 % (ref 11.5–15.5)
WBC: 6.6 10*3/uL (ref 4.0–10.5)
nRBC: 0 % (ref 0.0–0.2)

## 2020-01-25 LAB — HCG, QUANTITATIVE, PREGNANCY: hCG, Beta Chain, Quant, S: 371 m[IU]/mL — ABNORMAL HIGH (ref <5)

## 2020-01-25 LAB — ABO/RH: ABO/RH(D): O POS

## 2020-01-25 NOTE — ED Provider Notes (Signed)
Emergency Department Provider Note  ____________________________________________  Time seen: Approximately 10:36 PM  I have reviewed the triage vital signs and the nursing notes.   HISTORY  Chief Complaint Vaginal Bleeding   Historian Patient     HPI Brittany Young is a 24 y.o. female G3, P2 presents to the emergency department.  Patient states that her last menstrual cycle was approximately 6 weeks ago.  She states that she has been having a moderate amount of vaginal bleeding.  No pelvic pain or abdominal pain.  Patient denies dysuria, hematuria or increased urinary frequency.   Past Medical History:  Diagnosis Date  . History of cesarean section 11/03/2016  . Hypertension    With past pregnancy  . Ovarian cyst      Immunizations up to date:  Yes.     Past Medical History:  Diagnosis Date  . History of cesarean section 11/03/2016  . Hypertension    With past pregnancy  . Ovarian cyst     Patient Active Problem List   Diagnosis Date Noted  . Fibrocystic breast changes, right 12/06/2019  . History of pregnancy induced hypertension 11/03/2016    Past Surgical History:  Procedure Laterality Date  . APPENDECTOMY    . CESAREAN SECTION N/A 09/2011  . CESAREAN SECTION N/A 09/27/2018   Procedure: CESAREAN SECTION repeat;  Surgeon: Hildred Laser, MD;  Location: [facility] ORS;  Service: Obstetrics;  Laterality: N/A;  . LAPAROSCOPIC APPENDECTOMY N/A 07/23/2015   Procedure: APPENDECTOMY LAPAROSCOPIC;  Surgeon: Lattie Haw, MD;  Location: [facility] ORS;  Service: General;  Laterality: N/A;    Prior to Admission medications   Not on File    Allergies Patient has no known allergies.  Family History  Problem Relation Age of Onset  . GER disease Mother   . Hypertension Mother   . Cancer Maternal Grandfather   . Diabetes Maternal Grandfather   . Hypertension Maternal Grandfather     Social History Social History   Tobacco Use  . Smoking status: Former  Smoker    Packs/day: 0.25    Types: Cigars  . Smokeless tobacco: Never Used  . Tobacco comment: smoke black and milds  Vaping Use  . Vaping Use: Never used  Substance Use Topics  . Alcohol use: No    Alcohol/week: 0.0 standard drinks  . Drug use: Not Currently    Types: Marijuana    Comment: last used- 02/06/2018     Review of Systems  Constitutional: No fever/chills Eyes:  No discharge ENT: No upper respiratory complaints. Respiratory: no cough. No SOB/ use of accessory muscles to breath Gastrointestinal:   No nausea, no vomiting.  No diarrhea.  No constipation. Genitourinary: Patient has vaginal bleeding.  Musculoskeletal: Negative for musculoskeletal pain. Skin: Negative for rash, abrasions, lacerations, ecchymosis.    ____________________________________________   PHYSICAL EXAM:  VITAL SIGNS: ED Triage Vitals  Enc Vitals Group     BP 01/25/20 1611 (!) 130/83     Pulse Rate 01/25/20 1611 92     Resp 01/25/20 1611 16     Temp 01/25/20 1611 98.4 F (36.9 C)     Temp Source 01/25/20 1611 Oral     SpO2 01/25/20 1611 99 %     Weight 01/25/20 1612 155 lb (70.3 kg)     Height 01/25/20 1612 5\' 3"  (1.6 m)     Head Circumference --      Peak Flow --      Pain Score 01/25/20 1612 7  Pain Loc --      Pain Edu? --      Excl. in GC? --      Constitutional: Alert and oriented. Well appearing and in no acute distress. Eyes: Conjunctivae are normal. PERRL. EOMI. Head: Atraumatic. Cardiovascular: Normal rate, regular rhythm. Normal S1 and S2.  Good peripheral circulation. Respiratory: Normal respiratory effort without tachypnea or retractions. Lungs CTAB. Good air entry to the bases with no decreased or absent breath sounds Gastrointestinal: Bowel sounds x 4 quadrants. Soft and nontender to palpation. No guarding or rigidity. No distention. Musculoskeletal: Full range of motion to all extremities. No obvious deformities noted Neurologic:  Normal for age. No gross focal  neurologic deficits are appreciated.  Skin:  Skin is warm, dry and intact. No rash noted. Psychiatric: Mood and affect are normal for age. Speech and behavior are normal.   ____________________________________________   LABS (all labs ordered are listed, but only abnormal results are displayed)  Labs Reviewed  HCG, QUANTITATIVE, PREGNANCY - Abnormal; Notable for the following components:      Result Value   hCG, Beta Chain, Quant, S 371 (*)    All other components within normal limits  CBC WITH DIFFERENTIAL/PLATELET - Abnormal; Notable for the following components:   Hemoglobin 11.2 (*)    HCT 34.4 (*)    All other components within normal limits  BASIC METABOLIC PANEL  POC URINE PREG, ED  ABO/RH   ____________________________________________  EKG   ____________________________________________  RADIOLOGY Geraldo Pitter, personally viewed and evaluated these images (plain radiographs) as part of my medical decision making, as well as reviewing the written report by the radiologist.  US OB LESS THAN 14 WEEKS WITH OB TRANSVAGINAL  Result Date: 01/25/2020 CLINICAL DATA:  Vaginal bleeding EXAM: OBSTETRIC <14 WK [place] AND TRANSVAGINAL OB US TECHNIQUE: Both transabdominal and transvaginal ultrasound examinations were performed for complete evaluation of the gestation as well as the maternal uterus, adnexal regions, and pelvic cul-de-sac. Transvaginal technique was performed to assess early pregnancy. COMPARISON:  None. FINDINGS: Intrauterine gestational sac: None Yolk sac:  Not Visualized. Embryo:  Not Visualized. Maternal uterus/adnexae: Ovaries are within normal limits. Right ovary measures 2.7 x 2 x 2.5 cm. The left ovary measures 3.1 x 1.3 x 1.6 cm. No definitive adnexal mass. Trace free fluid in the pelvis. IMPRESSION: 1. No IUP identified. Findings consistent with pregnancy of unknown location, main differential for which is recent failed pregnancy versus occult ectopic pregnancy  (IUP too early to visualize felt less likely given the quantitative HCG). Only trace free fluid in the pelvis. No definitive adnexal mass. Recommend trending of HCG with repeat ultrasound as indicated. Electronically Signed   By: Jasmine Pang M.D.   On: 01/25/2020 20:04    ____________________________________________    PROCEDURES  Procedure(s) performed:     Procedures     Medications - No data to display   ____________________________________________   INITIAL IMPRESSION / ASSESSMENT AND PLAN / ED COURSE  Pertinent labs & imaging results that were available during my care of the patient were reviewed by me and considered in my medical decision making (see chart for details).    Assessment and plan Vaginal bleeding in first trimester 24 year old female presents to the emergency department with vaginal bleeding for less than 24 hours.  Vital signs were reassuring in exam room.  Patient was resting comfortably in no apparent discomfort.  CBC and BMP were reassuring.  Patient was O+.  Beta hCG was elevated at 371  with no IUP identified.  Patient was advised to follow-up with her OB/GYN or return to the emergency department in 48 hours for repeat beta hCG and possible ultrasound.  She voiced understanding and stated that she would return. ____________________________________________  FINAL CLINICAL IMPRESSION(S) / ED DIAGNOSES  Final diagnoses:  Vaginal bleeding in pregnancy, first trimester      NEW MEDICATIONS STARTED DURING THIS VISIT:  ED Discharge Orders    None          This chart was dictated using voice recognition software/Dragon. Despite best efforts to proofread, errors can occur which can change the meaning. Any change was purely unintentional.     Orvil Feil, PA-C 01/25/20 2241    Dionne Bucy, MD 01/25/20 2340

## 2020-01-25 NOTE — ED Triage Notes (Signed)
t her for vaginal bleeding. Pt took a home pregnancy test that was positive but has an appt next week to confirm. Pt wants to know why she is bleeding. Pt reports cramping as well.

## 2020-01-25 NOTE — Telephone Encounter (Signed)
Pt called in and stated that she has an appt on Tuesday and the pt started bleeding and cramping today. The pt is requesting a call back from a nurse. Please advise

## 2020-01-25 NOTE — Telephone Encounter (Signed)
Spoke to pt. She is currently at the ER being seen due to her bleeding has picked up and she was concerned. Pt stated that she was in a room waiting to be seen by the provider.

## 2020-01-25 NOTE — Discharge Instructions (Signed)
Please return to the emergency department in 48 hours for a recheck of beta-hCG and pelvic ultrasound. You can also have follow-up with your OB/GYN if you can get an appointment.

## 2020-01-25 NOTE — ED Notes (Signed)
See triage note  States her LMP was 12/12/2019  States she took 2 positive home tests  Then developed some vaginal bleeding with slight cramping

## 2020-01-27 ENCOUNTER — Other Ambulatory Visit: Payer: Self-pay

## 2020-01-27 ENCOUNTER — Encounter: Payer: Self-pay | Admitting: Emergency Medicine

## 2020-01-27 ENCOUNTER — Emergency Department
Admission: EM | Admit: 2020-01-27 | Discharge: 2020-01-27 | Disposition: A | Discharge status: Home / Self Care | Payer: Medicaid Other | Attending: Emergency Medicine | Admitting: Emergency Medicine

## 2020-01-27 DIAGNOSIS — Z87891 Personal history of nicotine dependence: Secondary | ICD-10-CM | POA: Insufficient documentation

## 2020-01-27 DIAGNOSIS — I10 Essential (primary) hypertension: Secondary | ICD-10-CM | POA: Insufficient documentation

## 2020-01-27 DIAGNOSIS — O039 Complete or unspecified spontaneous abortion without complication: Secondary | ICD-10-CM

## 2020-01-27 DIAGNOSIS — O4690 Antepartum hemorrhage, unspecified, unspecified trimester: Secondary | ICD-10-CM | POA: Diagnosis present

## 2020-01-27 LAB — HCG, QUANTITATIVE, PREGNANCY: hCG, Beta Chain, Quant, S: 78 m[IU]/mL — ABNORMAL HIGH (ref <5)

## 2020-01-27 NOTE — ED Triage Notes (Signed)
Pt reports was here on 7/23 for vag bleeding during pregnancy and advised to come back today for an [place] and a recheck of her Beta-hcg

## 2020-01-27 NOTE — ED Provider Notes (Signed)
[facility] Emergency Department Provider Note  ____________________________________________   First MD Initiated Contact with Patient 01/27/20 1444     (approximate)  I have reviewed the triage vital signs and the nursing notes.   HISTORY  Chief Complaint Vaginal Bleeding    HPI Debbe Crumble is a 24 y.o. female is here for repeat of her blood test.  Patient states that she was here on 7/23 for vaginal bleeding during pregnancy.  She was advised to return for a recheck of her beta hCG.  Ultrasound done on the 23rd did not show a sac or anything in the fallopian tubes.     Past Medical History:  Diagnosis Date  . History of cesarean section 11/03/2016  . Hypertension    With past pregnancy  . Ovarian cyst     Patient Active Problem List   Diagnosis Date Noted  . Fibrocystic breast changes, right 12/06/2019  . History of pregnancy induced hypertension 11/03/2016    Past Surgical History:  Procedure Laterality Date  . APPENDECTOMY    . CESAREAN SECTION N/A 09/2011  . CESAREAN SECTION N/A 09/27/2018   Procedure: CESAREAN SECTION repeat;  Surgeon: Hildred Laser, MD;  Location: [facility] ORS;  Service: Obstetrics;  Laterality: N/A;  . LAPAROSCOPIC APPENDECTOMY N/A 07/23/2015   Procedure: APPENDECTOMY LAPAROSCOPIC;  Surgeon: Lattie Haw, MD;  Location: [facility] ORS;  Service: General;  Laterality: N/A;    Prior to Admission medications   Not on File    Allergies Patient has no known allergies.  Family History  Problem Relation Age of Onset  . GER disease Mother   . Hypertension Mother   . Cancer Maternal Grandfather   . Diabetes Maternal Grandfather   . Hypertension Maternal Grandfather     Social History Social History   Tobacco Use  . Smoking status: Former Smoker    Packs/day: 0.25    Types: Cigars  . Smokeless tobacco: Never Used  . Tobacco comment: smoke black and milds  Vaping Use  . Vaping Use: Never used  Substance Use  Topics  . Alcohol use: No    Alcohol/week: 0.0 standard drinks  . Drug use: Not Currently    Types: Marijuana    Comment: last used- 02/06/2018    Review of Systems Constitutional: No fever/chills Genitourinary: Positive vaginal bleeding. Musculoskeletal: Negative for back pain. Skin: Negative for rash. Neurological: Negative for headaches ____________________________________________   PHYSICAL EXAM:  VITAL SIGNS: ED Triage Vitals  Enc Vitals Group     BP 01/27/20 1311 (!) 133/87     Pulse Rate 01/27/20 1311 82     Resp 01/27/20 1311 16     Temp 01/27/20 1311 98.7 F (37.1 C)     Temp Source 01/27/20 1311 Oral     SpO2 01/27/20 1311 100 %     Weight 01/27/20 1258 155 lb (70.3 kg)     Height 01/27/20 1258 5\' 3"  (1.6 m)     Head Circumference --      Peak Flow --      Pain Score 01/27/20 1257 7     Pain Loc --      Pain Edu? --      Excl. in GC? --     Constitutional: Alert and oriented. Well appearing and in no acute distress. Eyes: Conjunctivae are normal. PERRL. EOMI. Head: Atraumatic. Nose: No congestion/rhinnorhea. Mouth/Throat: Mucous membranes are moist.  Oropharynx non-erythematous. Neck: No stridor.   Cardiovascular: Normal rate, regular rhythm. Grossly normal  heart sounds.  Good peripheral circulation. Respiratory: Normal respiratory effort.  No retractions. Lungs CTAB. Gastrointestinal: Soft and nontender. No distention. No abdominal bruits. No CVA tenderness. Musculoskeletal: No lower extremity tenderness nor edema.  No joint effusions. Neurologic:  Normal speech and language. No gross focal neurologic deficits are appreciated. No gait instability. Skin:  Skin is warm, dry and intact. No rash noted. Psychiatric: Mood and affect are normal. Speech and behavior are normal.  ____________________________________________   LABS (all labs ordered are listed, but only abnormal results are displayed)  Labs Reviewed  HCG, QUANTITATIVE, PREGNANCY - Abnormal;  Notable for the following components:      Result Value   hCG, Beta Chain, Quant, S 78 (*)    All other components within normal limits   ____________________________________________  EKG   ____________________________________________  RADIOLOGY   Official radiology report(s): No results found.  ____________________________________________   PROCEDURES  Procedure(s) performed (including Critical Care):  Procedures   ____________________________________________   INITIAL IMPRESSION / ASSESSMENT AND PLAN / ED COURSE  As part of my medical decision making, I reviewed the following data within the electronic MEDICAL RECORD NUMBER Notes from prior ED visits and [place] Controlled Substance Database  24 year old female presents to the ED for repeat of her beta-hCG.  Patient's beta on 7/23 was 371.  She was being seen in the ED at that time for vaginal bleeding.  Today's beta is 78 and patient was made aware that this is not a viable pregnancy.  Patient has an appointment with her OB/GYN this week.  She is encouraged to keep that appointment.  Blood type on 723 is documented as O+.  ____________________________________________   FINAL CLINICAL IMPRESSION(S) / ED DIAGNOSES  Final diagnoses:  Spontaneous abortion     ED Discharge Orders    None       Note:  This document was prepared using Dragon voice recognition software and may include unintentional dictation errors.    Tommi Rumps, PA-C 01/27/20 1540    Jene Every, MD 01/29/20 [phone]

## 2020-01-27 NOTE — Discharge Instructions (Signed)
Keep your appointment with your OB/GYN this week.

## 2020-01-28 LAB — POCT PREGNANCY, URINE: Preg Test, Ur: POSITIVE — AB

## 2020-01-29 ENCOUNTER — Other Ambulatory Visit: Payer: Self-pay

## 2020-01-29 ENCOUNTER — Encounter: Payer: Self-pay | Admitting: Obstetrics and Gynecology

## 2020-01-29 ENCOUNTER — Ambulatory Visit (INDEPENDENT_AMBULATORY_CARE_PROVIDER_SITE_OTHER): Payer: Medicaid Other | Admitting: Obstetrics and Gynecology

## 2020-01-29 VITALS — BP 133/89 | HR 88 | Ht 63.0 in | Wt 151.9 lb

## 2020-01-29 DIAGNOSIS — O039 Complete or unspecified spontaneous abortion without complication: Secondary | ICD-10-CM

## 2020-01-29 NOTE — Progress Notes (Signed)
Pt present for follow up from ED for possible miscarriage. Pt stated that she was doing well no problems.

## 2020-01-29 NOTE — Progress Notes (Signed)
GYNECOLOGY CLINIC PROGRESS NOTE Subjective:     Brittany Young is an 24 y.o. [phone] female who presents for follow up from the ED after possible miscarriage. Patient was seen initially in the ED on 01/25/20 due to vaginal bleeding and cramping. Patient was instructed to follow up in 48 hours and was informed on 01/27/20 that she most likely had a miscarriage due to declining BHCG levels. Patient's LMP was on 12/12/19 and after that she took 2 home pregnancy tests and they were both positive. Patient states she is still currently bleeding and having cramping. Patient denies soaking through pads but states she changes them frequently, but is unsure how often or how many she uses in a day. Patient is not currently trying to conceive and she is currently not using any form of contraception. Emotionally she states that she is fine.   The following portions of the patient's history were reviewed and updated as appropriate: allergies, current medications, past family history, past medical history, past social history, past surgical history and problem list.  Review of Systems A comprehensive review of systems was negative except for: what is noted in HPI      Objective:    BP (!) 133/89   Pulse 88   Ht 5\' 3"  (1.6 m)   Wt 68.9 kg   LMP 12/12/2019 (Exact Date)   BMI 26.91 kg/m  General appearance: alert and no distress   Labs:  Results for MONICIA, TSE (MRN Ellie Lunch) as of 01/29/2020 16:37  Ref. Range 01/25/2020 16:18 01/27/2020 13:13  HCG, Beta Chain, Quant, S Latest Ref Range: <5 mIU/mL 371 (H) 78 (H)     Lab Results  Component Value Date   WBC 6.6 01/25/2020   HGB 11.2 (L) 01/25/2020   HCT 34.4 (L) 01/25/2020   MCV 84.9 01/25/2020   PLT 251 01/25/2020    Lab Results  Component Value Date   ABORH  01/25/2020    O POS Performed at [facility], [address] [place], [place]      Imaging:  [id number] OB LESS THAN 14 WEEKS WITH OB  TRANSVAGINAL CLINICAL DATA:  Vaginal bleeding  EXAM: OBSTETRIC <14 WK [place] AND TRANSVAGINAL OB US  TECHNIQUE: Both transabdominal and transvaginal ultrasound examinations were performed for complete evaluation of the gestation as well as the maternal uterus, adnexal regions, and pelvic cul-de-sac. Transvaginal technique was performed to assess early pregnancy.  COMPARISON:  None.  FINDINGS: Intrauterine gestational sac: None  Yolk sac:  Not Visualized.  Embryo:  Not Visualized.  Maternal uterus/adnexae: Ovaries are within normal limits. Right ovary measures 2.7 x 2 x 2.5 cm. The left ovary measures 3.1 x 1.3 x 1.6 cm. No definitive adnexal mass. Trace free fluid in the pelvis.  IMPRESSION: 1. No IUP identified. Findings consistent with pregnancy of unknown location, main differential for which is recent failed pregnancy versus occult ectopic pregnancy (IUP too early to visualize felt less likely given the quantitative HCG). Only trace free fluid in the pelvis. No definitive adnexal mass. Recommend trending of HCG with repeat ultrasound as indicated.  Electronically Signed   By: [place] M.D.   On: 01/25/2020 20:04   Assessment:  Follow up s/p spontaneous abortion  Plan:   S/p Spontaneous abortion -- Patient was counseled to take Ibuprofen to help with the cramping. Patient was counseled on birth control and stated she did not necessarily want to start any at this time as she did not plan  on being sexually active right now. She was provided samples of Phexxi gel for in case unplanned sexual activity did occur. Patient will follow up in December for her annual exam or sooner if other concerns arise.  Did not require Rhogam.     I have seen and examined the patient with Dominic Pea, [place] PA-S.  I have reviewed the record and concur with patient management and plan as documented.   A total of 15 minutes were spent face-to-face with the patient during this encounter  and over half of that time dealt with counseling and coordination of care.  Hildred Laser, MD [facility]

## 2020-01-29 NOTE — Patient Instructions (Signed)
Miscarriage A miscarriage is the loss of an unborn baby (fetus) before the 20th week of pregnancy. Follow these instructions at home: Medicines   Take over-the-counter and prescription medicines only as told by your doctor.  If you were prescribed antibiotic medicine, take it as told by your doctor. Do not stop taking the antibiotic even if you start to feel better.  Do not take NSAIDs unless your doctor says that this is safe for you. NSAIDs include aspirin and ibuprofen. These medicines can cause bleeding. Activity  Rest as directed. Ask your doctor what activities are safe for you.  Have someone help you at home during this time. General instructions  Write down how many pads you use each day and how soaked they are.  Watch the amount of tissue or clumps of blood (blood clots) that you pass from your vagina. Save any large amounts of tissue for your doctor.  Do not use tampons, douche, or have sex until your doctor approves.  To help you and your partner with the process of grieving, talk with your doctor or seek counseling.  When you are ready, meet with your doctor to talk about steps you should take for your health. Also, talk with your doctor about steps to take to have a healthy pregnancy in the future.  Keep all follow-up visits as told by your doctor. This is important. Contact a doctor if:  You have a fever or chills.  You have vaginal discharge that smells bad.  You have more bleeding. Get help right away if:  You have very bad cramps or pain in your back or belly.  You pass clumps of blood that are walnut-sized or larger from your vagina.  You pass tissue that is walnut-sized or larger from your vagina.  You soak more than 1 regular pad in an hour.  You get light-headed or weak.  You faint (pass out).  You have feelings of sadness that do not go away, or you have thoughts of hurting yourself. Summary  A miscarriage is the loss of an unborn baby before  the 20th week of pregnancy.  Follow your doctor's instructions for home care. Keep all follow-up appointments.  To help you and your partner with the process of grieving, talk with your doctor or seek counseling. This information is not intended to replace advice given to you by your health care provider. Make sure you discuss any questions you have with your health care provider. Document Revised: 10/13/2018 Document Reviewed: 07/27/2016 Elsevier Patient Education  2020 [employer] Inc[employer]  

## 2020-06-17 NOTE — Progress Notes (Signed)
Annual Exam/Pregnancy Confirmation-Pt's last LMP 05/15/2020 UPT in office today was positive. Pt stated that she was doing well no problems.

## 2020-06-17 NOTE — Patient Instructions (Signed)

## 2020-06-18 ENCOUNTER — Ambulatory Visit (INDEPENDENT_AMBULATORY_CARE_PROVIDER_SITE_OTHER): Payer: Medicaid Other | Admitting: Obstetrics and Gynecology

## 2020-06-18 ENCOUNTER — Encounter: Payer: Self-pay | Admitting: Obstetrics and Gynecology

## 2020-06-18 ENCOUNTER — Other Ambulatory Visit: Payer: Self-pay

## 2020-06-18 ENCOUNTER — Encounter: Payer: Medicaid Other | Admitting: Obstetrics and Gynecology

## 2020-06-18 VITALS — BP 131/78 | HR 92 | Ht 63.0 in | Wt 156.5 lb

## 2020-06-18 DIAGNOSIS — Z01419 Encounter for gynecological examination (general) (routine) without abnormal findings: Secondary | ICD-10-CM

## 2020-06-18 DIAGNOSIS — E663 Overweight: Secondary | ICD-10-CM

## 2020-06-18 DIAGNOSIS — O09299 Supervision of pregnancy with other poor reproductive or obstetric history, unspecified trimester: Secondary | ICD-10-CM | POA: Diagnosis not present

## 2020-06-18 DIAGNOSIS — Z3201 Encounter for pregnancy test, result positive: Secondary | ICD-10-CM | POA: Diagnosis not present

## 2020-06-18 DIAGNOSIS — N926 Irregular menstruation, unspecified: Secondary | ICD-10-CM

## 2020-06-18 LAB — POCT URINE PREGNANCY: Preg Test, Ur: POSITIVE — AB

## 2020-06-18 NOTE — Progress Notes (Signed)
GYNECOLOGY ANNUAL PHYSICAL EXAM PROGRESS NOTE  Subjective:    Brittany Young is a 24 y.o. [phone] female who presents for an annual exam.   The patient wears seatbelts: yes. The patient participates in regular exercise: no. The patient reports that there is not domestic violence in her life.   Brittany Young has the following concerns today:  1. She reports missed menses.  Cycles are usually regular. Patient's last menstrual period was 05/15/2020 (exact date).  Took a UPT at home several days ago due to "feeling different" with 1 bout of nausea and moodiness. UPT was positive at home.    Menstrual History: Menarche age: 53 Patient's last menstrual period was 05/15/2020 (exact date).     Gynecologic History:  Contraception: none History of STI's:  Last Pap: 09/19/2018. Results were: normal.  Denies h/o abnormal pap smears.    Upstream - 06/18/20 2219      Pregnancy Intention Screening   Does the patient want to become pregnant in the next year? Yes    Does the patient's partner want to become pregnant in the next year? Unsure    Would the patient like to discuss contraceptive options today? No      Contraception Wrap Up   Current Method Pregnant/Seeking Pregnancy    End Method Pregnant/Seeking Pregnancy    Contraception Counseling Provided No          The pregnancy intention screening data noted above was reviewed. Potential methods of contraception were not discussed. The patient elected to proceed with Pregnant/Seeking Pregnancy.     OB History  Gravida Para Term Preterm AB Living  3 2 1 1  0 2  SAB IAB Ectopic Multiple Live Births  0 0 0 0 2    # Outcome Date GA Lbr Len/2nd Weight Sex Delivery Anes PTL Lv  3 Current           2 Preterm 09/28/18 [redacted]w[redacted]d  5 lb 1.8 oz (2.32 kg) F CS-LTranv Spinal  LIV     Name: Brittany Young     Apgar1: 8  Apgar5: 9  1 Term 2013 [redacted]w[redacted]d  6 lb 12 oz (3.062 kg) M CS-LTranv   LIV     Complications: Cephalopelvic Disproportion     Obstetric Comments  G1- C-section for CPD.  Had GHTN, was induced.   G2 - placental abruption.  Postpartum HTN    Past Medical History:  Diagnosis Date  . History of cesarean section 11/03/2016  . Hypertension    With past pregnancy  . Ovarian cyst     Past Surgical History:  Procedure Laterality Date  . APPENDECTOMY    . CESAREAN SECTION N/A 09/2011  . CESAREAN SECTION N/A 09/27/2018   Procedure: CESAREAN SECTION repeat;  Surgeon: 09/29/2018, MD;  Location: [facility] ORS;  Service: Obstetrics;  Laterality: N/A;  . LAPAROSCOPIC APPENDECTOMY N/A 07/23/2015   Procedure: APPENDECTOMY LAPAROSCOPIC;  Surgeon: 07/25/2015, MD;  Location: [facility] ORS;  Service: General;  Laterality: N/A;  . WISDOM TOOTH EXTRACTION      Family History  Problem Relation Age of Onset  . GER disease Mother   . Hypertension Mother   . Cancer Maternal Grandfather   . Diabetes Maternal Grandfather   . Hypertension Maternal Grandfather     Social History   Socioeconomic History  . Marital status: Single    Spouse name: Brittany Young  . Number of children: Not on file  . Years of education: Not on file  . Highest education  level: Not on file  Occupational History  . Not on file  Tobacco Use  . Smoking status: Former Smoker    Packs/day: 0.25    Types: Cigars  . Smokeless tobacco: Never Used  . Tobacco comment: smoke black and milds  Vaping Use  . Vaping Use: Never used  Substance and Sexual Activity  . Alcohol use: No    Alcohol/week: 0.0 standard drinks  . Drug use: Not Currently    Types: Marijuana    Comment: last used- 02/06/2018  . Sexual activity: Yes    Birth control/protection: None  Other Topics Concern  . Not on file  Social History Narrative  . Not on file   Social Determinants of Health   Financial Resource Strain: Not on file  Food Insecurity: Not on file  Transportation Needs: Not on file  Physical Activity: Not on file  Stress: Not on file  Social Connections: Not on file   Intimate Partner Violence: Not on file    No current outpatient medications on file prior to visit.   No current facility-administered medications on file prior to visit.    No Known Allergies    Review of Systems Constitutional: negative for chills, fatigue, fevers and sweats Eyes: negative for irritation, redness and visual disturbance Ears, nose, mouth, throat, and face: negative for hearing loss, nasal congestion, snoring and tinnitus Respiratory: negative for asthma, cough, sputum Cardiovascular: negative for chest pain, dyspnea, exertional chest pressure/discomfort, irregular heart beat, palpitations and syncope Gastrointestinal: negative for abdominal pain, change in bowel habits, nausea and vomiting Genitourinary: negative for abnormal menstrual periods, genital lesions, sexual problems and vaginal discharge, dysuria and urinary incontinence Integument/breast: negative for breast lump, breast tenderness and nipple discharge Hematologic/lymphatic: negative for bleeding and easy bruising Musculoskeletal:negative for back pain and muscle weakness Neurological: negative for dizziness, headaches, vertigo and weakness Endocrine: negative for diabetic symptoms including polydipsia, polyuria and skin dryness Allergic/Immunologic: negative for hay fever and urticaria        Objective:  Blood pressure 131/78, pulse 92, height 5\' 3"  (1.6 m), weight 156 lb 8 oz (71 kg), last menstrual period 05/15/2020, not currently breastfeeding. Body mass index is 27.72 kg/m.  General Appearance:    Alert, cooperative, no distress, appears stated age, overweight  Head:    Normocephalic, without obvious abnormality, atraumatic  Eyes:    PERRL, conjunctiva/corneas clear, EOM's intact, both eyes  Ears:    Normal external ear canals, both ears  Nose:   Nares normal, septum midline, mucosa normal, no drainage or sinus tenderness  Throat:   Lips, mucosa, and tongue normal; teeth and gums normal   Neck:   Supple, symmetrical, trachea midline, no adenopathy; thyroid: no enlargement/tenderness/nodules; no carotid bruit or JVD  Back:     Symmetric, no curvature, ROM normal, no CVA tenderness  Lungs:     Clear to auscultation bilaterally, respirations unlabored  Chest Wall:    No tenderness or deformity   Heart:    Regular rate and rhythm, S1 and S2 normal, no murmur, rub or gallop  Breast Exam:    No tenderness, masses, or nipple abnormality  Abdomen:     Soft, non-tender, bowel sounds active all four quadrants, no masses, no organomegaly.    Genitalia:    Pelvic:external genitalia normal, vagina without lesions, discharge, or tenderness, rectovaginal septum  normal. Cervix normal in appearance, no cervical motion tenderness, no adnexal masses or tenderness.  Uterus normal size, shape, mobile, regular contours, nontender.  Rectal:  Normal external sphincter.  No hemorrhoids appreciated. Internal exam not done.   Extremities:   Extremities normal, atraumatic, no cyanosis or edema  Pulses:   2+ and symmetric all extremities  Skin:   Skin color, texture, turgor normal, no rashes or lesions  Lymph nodes:   Cervical, supraclavicular, and axillary nodes normal  Neurologic:   CNII-XII intact, normal strength, sensation and reflexes throughout   .  Labs:  Lab Results  Component Value Date   WBC 6.6 01/25/2020   HGB 11.2 (L) 01/25/2020   HCT 34.4 (L) 01/25/2020   MCV 84.9 01/25/2020   PLT 251 01/25/2020    Lab Results  Component Value Date   CREATININE 0.69 01/25/2020   BUN 8 01/25/2020   NA 138 01/25/2020   K 3.8 01/25/2020   CL 106 01/25/2020   CO2 26 01/25/2020    Lab Results  Component Value Date   ALT 10 06/13/2019   AST 13 06/13/2019   ALKPHOS 73 06/13/2019   BILITOT 0.2 06/13/2019    No results found for: TSH   Assessment:    1. Encounter for well woman exam with routine gynecological exam   2. Missed menses   3. Pregnancy test performed, pregnancy confirmed    4. History of miscarriage, currently pregnant   5. Overweight (BMI 25.0-29.9)      Plan:    Blood tests: Labs up to date.. Breast self exam technique reviewed and patient encouraged to perform self-exam monthly. Contraception: none.  Discussed healthy lifestyle modifications. Pap smear up to date.  Missed menses, with positive urine pregnancy test in office today.  Based on LMP., patient is currently 4.6 weeks, with EGA 02/10/2019. Discussed initiation of prenatal vitamins, samples given in office today. To return in 2-3 weeks for viability/dating scan and NOB intake.  Flu vaccine status: declines COVID vaccination status: declines   Hildred Laser, MD [facility]

## 2020-07-01 ENCOUNTER — Encounter: Payer: Medicaid Other | Admitting: Obstetrics and Gynecology

## 2020-07-02 ENCOUNTER — Other Ambulatory Visit: Payer: Self-pay

## 2020-07-02 ENCOUNTER — Encounter: Payer: Self-pay | Admitting: Emergency Medicine

## 2020-07-02 DIAGNOSIS — O4691 Antepartum hemorrhage, unspecified, first trimester: Secondary | ICD-10-CM | POA: Diagnosis not present

## 2020-07-02 DIAGNOSIS — Z3A01 Less than 8 weeks gestation of pregnancy: Secondary | ICD-10-CM | POA: Insufficient documentation

## 2020-07-02 DIAGNOSIS — O26891 Other specified pregnancy related conditions, first trimester: Secondary | ICD-10-CM | POA: Insufficient documentation

## 2020-07-02 DIAGNOSIS — R109 Unspecified abdominal pain: Secondary | ICD-10-CM | POA: Diagnosis not present

## 2020-07-02 DIAGNOSIS — Z5321 Procedure and treatment not carried out due to patient leaving prior to being seen by health care provider: Secondary | ICD-10-CM | POA: Diagnosis not present

## 2020-07-02 LAB — ABO/RH: ABO/RH(D): O POS

## 2020-07-02 LAB — URINALYSIS, COMPLETE (UACMP) WITH MICROSCOPIC
Bacteria, UA: NONE SEEN
Bilirubin Urine: NEGATIVE
Glucose, UA: NEGATIVE mg/dL
Ketones, ur: NEGATIVE mg/dL
Leukocytes,Ua: NEGATIVE
Nitrite: NEGATIVE
Protein, ur: NEGATIVE mg/dL
RBC / HPF: >50 RBC/hpf — ABNORMAL HIGH (ref 0–5)
Specific Gravity, Urine: 1.021 (ref 1.005–1.030)
pH: 6 (ref 5.0–8.0)

## 2020-07-02 LAB — COMPREHENSIVE METABOLIC PANEL
ALT: 13 U/L (ref 0–44)
AST: 14 U/L — ABNORMAL LOW (ref 15–41)
Albumin: 3.7 g/dL (ref 3.5–5.0)
Alkaline Phosphatase: 48 U/L (ref 38–126)
Anion gap: 7 (ref 5–15)
BUN: 9 mg/dL (ref 6–20)
CO2: 24 mmol/L (ref 22–32)
Calcium: 8.8 mg/dL — ABNORMAL LOW (ref 8.9–10.3)
Chloride: 102 mmol/L (ref 98–111)
Creatinine, Ser: 0.45 mg/dL (ref 0.44–1.00)
GFR, Estimated: >60 mL/min (ref >60)
Glucose, Bld: 113 mg/dL — ABNORMAL HIGH (ref 70–99)
Potassium: 3.6 mmol/L (ref 3.5–5.1)
Sodium: 133 mmol/L — ABNORMAL LOW (ref 135–145)
Total Bilirubin: 0.6 mg/dL (ref 0.3–1.2)
Total Protein: 7.2 g/dL (ref 6.5–8.1)

## 2020-07-02 LAB — CBC
HCT: 32.9 % — ABNORMAL LOW (ref 36.0–46.0)
Hemoglobin: 11.1 g/dL — ABNORMAL LOW (ref 12.0–15.0)
MCH: 27.7 pg (ref 26.0–34.0)
MCHC: 33.7 g/dL (ref 30.0–36.0)
MCV: 82 fL (ref 80.0–100.0)
Platelets: 219 10*3/uL (ref 150–400)
RBC: 4.01 MIL/uL (ref 3.87–5.11)
RDW: 12.1 % (ref 11.5–15.5)
WBC: 6.4 10*3/uL (ref 4.0–10.5)
nRBC: 0 % (ref 0.0–0.2)

## 2020-07-02 LAB — LIPASE, BLOOD: Lipase: 25 U/L (ref 11–51)

## 2020-07-02 LAB — POC URINE PREG, ED: Preg Test, Ur: POSITIVE — AB

## 2020-07-02 NOTE — ED Triage Notes (Signed)
Pt to ED from home c/o vaginal bleeding tonight, denies clots, bright red at home and then episode of dark after arrival to ED.  Denies SOB, weakness or LOC, skin is WNL.  States 6wks 6days pregnant, G4 P2, 1 miscarriage in July.  Pt seen at [facility] last seen December 15th.  Reports some abd pain to left side.

## 2020-07-03 ENCOUNTER — Other Ambulatory Visit: Payer: Self-pay

## 2020-07-03 ENCOUNTER — Emergency Department: Payer: Medicaid Other

## 2020-07-03 ENCOUNTER — Telehealth: Payer: Self-pay

## 2020-07-03 ENCOUNTER — Emergency Department
Admission: EM | Admit: 2020-07-03 | Discharge: 2020-07-03 | Disposition: A | Discharge status: Home / Self Care | Payer: Medicaid Other | Attending: Emergency Medicine | Admitting: Emergency Medicine

## 2020-07-03 DIAGNOSIS — O208 Other hemorrhage in early pregnancy: Secondary | ICD-10-CM | POA: Insufficient documentation

## 2020-07-03 DIAGNOSIS — N939 Abnormal uterine and vaginal bleeding, unspecified: Secondary | ICD-10-CM

## 2020-07-03 DIAGNOSIS — O131 Gestational [pregnancy-induced] hypertension without significant proteinuria, first trimester: Secondary | ICD-10-CM | POA: Insufficient documentation

## 2020-07-03 DIAGNOSIS — Z87891 Personal history of nicotine dependence: Secondary | ICD-10-CM | POA: Insufficient documentation

## 2020-07-03 DIAGNOSIS — O200 Threatened abortion: Secondary | ICD-10-CM | POA: Insufficient documentation

## 2020-07-03 DIAGNOSIS — Z3A01 Less than 8 weeks gestation of pregnancy: Secondary | ICD-10-CM | POA: Insufficient documentation

## 2020-07-03 LAB — COMPREHENSIVE METABOLIC PANEL
ALT: 11 U/L (ref 0–44)
AST: 14 U/L — ABNORMAL LOW (ref 15–41)
Albumin: 3.7 g/dL (ref 3.5–5.0)
Alkaline Phosphatase: 48 U/L (ref 38–126)
Anion gap: 8 (ref 5–15)
BUN: 8 mg/dL (ref 6–20)
CO2: 25 mmol/L (ref 22–32)
Calcium: 9.1 mg/dL (ref 8.9–10.3)
Chloride: 104 mmol/L (ref 98–111)
Creatinine, Ser: 0.64 mg/dL (ref 0.44–1.00)
GFR, Estimated: >60 mL/min (ref >60)
Glucose, Bld: 85 mg/dL (ref 70–99)
Potassium: 3.7 mmol/L (ref 3.5–5.1)
Sodium: 137 mmol/L (ref 135–145)
Total Bilirubin: 0.5 mg/dL (ref 0.3–1.2)
Total Protein: 7.3 g/dL (ref 6.5–8.1)

## 2020-07-03 LAB — CBC WITH DIFFERENTIAL/PLATELET
Abs Immature Granulocytes: 0.02 10*3/uL (ref 0.00–0.07)
Basophils Absolute: 0 10*3/uL (ref 0.0–0.1)
Basophils Relative: 0 %
Eosinophils Absolute: 0.2 10*3/uL (ref 0.0–0.5)
Eosinophils Relative: 2 %
HCT: 33.1 % — ABNORMAL LOW (ref 36.0–46.0)
Hemoglobin: 11.1 g/dL — ABNORMAL LOW (ref 12.0–15.0)
Immature Granulocytes: 0 %
Lymphocytes Relative: 24 %
Lymphs Abs: 1.7 10*3/uL (ref 0.7–4.0)
MCH: 27.9 pg (ref 26.0–34.0)
MCHC: 33.5 g/dL (ref 30.0–36.0)
MCV: 83.2 fL (ref 80.0–100.0)
Monocytes Absolute: 0.7 10*3/uL (ref 0.1–1.0)
Monocytes Relative: 9 %
Neutro Abs: 4.7 10*3/uL (ref 1.7–7.7)
Neutrophils Relative %: 65 %
Platelets: 229 10*3/uL (ref 150–400)
RBC: 3.98 MIL/uL (ref 3.87–5.11)
RDW: 12 % (ref 11.5–15.5)
WBC: 7.3 10*3/uL (ref 4.0–10.5)
nRBC: 0 % (ref 0.0–0.2)

## 2020-07-03 LAB — HCG, QUANTITATIVE, PREGNANCY: hCG, Beta Chain, Quant, S: 79404 m[IU]/mL — ABNORMAL HIGH (ref <5)

## 2020-07-03 NOTE — Telephone Encounter (Signed)
Patient called in stating that she is pregnant and is having some light vaginal bleeding as well as abdominal cramping. Patient went to the ED last night but left before being seen because there was too many people ahead of her to wait any longer. Informed patient that her provider wasn't in the office today and that I would send a message to her providers nurse to make her aware of what is going on and that she would be in touch to better advise her.  Could you please advise?

## 2020-07-03 NOTE — Telephone Encounter (Signed)
Spoke to pt and advised pt to go to Urgent Care to be treated. Pt stated that she would go to [facility] or back to the ED.

## 2020-07-03 NOTE — ED Triage Notes (Signed)
Pt comes POV to ER for vaginal bleeding, could not be seen by primary today. Was seen last night for vaginal bleeding, LWBS. States bleeding is better today and only there when wiping. Denies pain currently, states she's had some intermittent abd pain throughout the day. Reports some nausea.

## 2020-07-03 NOTE — ED Notes (Signed)
No answer when called several times from lobby 

## 2020-07-03 NOTE — ED Notes (Addendum)
No answer when called several times from lobby; no answer when cell phone # listed in chart called 

## 2020-07-04 ENCOUNTER — Emergency Department
Admission: EM | Admit: 2020-07-04 | Discharge: 2020-07-04 | Disposition: A | Discharge status: Home / Self Care | Payer: Medicaid Other | Source: Home / Self Care | Attending: Emergency Medicine | Admitting: Emergency Medicine

## 2020-07-04 DIAGNOSIS — R58 Hemorrhage, not elsewhere classified: Secondary | ICD-10-CM

## 2020-07-04 DIAGNOSIS — O200 Threatened abortion: Secondary | ICD-10-CM

## 2020-07-04 DIAGNOSIS — O4690 Antepartum hemorrhage, unspecified, unspecified trimester: Secondary | ICD-10-CM

## 2020-07-04 DIAGNOSIS — O468X1 Other antepartum hemorrhage, first trimester: Secondary | ICD-10-CM

## 2020-07-04 DIAGNOSIS — O418X10 Other specified disorders of amniotic fluid and membranes, first trimester, not applicable or unspecified: Secondary | ICD-10-CM

## 2020-07-04 LAB — WET PREP, GENITAL
Clue Cells Wet Prep HPF POC: NONE SEEN
Sperm: NONE SEEN
Trich, Wet Prep: NONE SEEN
WBC, Wet Prep HPF POC: NONE SEEN
Yeast Wet Prep HPF POC: NONE SEEN

## 2020-07-04 LAB — CHLAMYDIA/NGC RT PCR (ARMC ONLY)
Chlamydia Tr: NOT DETECTED
N gonorrhoeae: NOT DETECTED

## 2020-07-04 NOTE — ED Provider Notes (Signed)
[facility] Emergency Department Provider Note  ____________________________________________  Time seen: Approximately 12:52 AM  I have reviewed the triage vital signs and the nursing notes.   HISTORY  Chief Complaint Vaginal Bleeding   HPI Brittany Young is a 24 y.o. female G4P2A1 currently at 7w per LMP  presents for evaluation of vaginal bleeding.  Bleeding started yesterday.  Has subsided considerably today.  Patient try to be seen yesterday but the wait was too long in the emergency room.  Try to see her doctor today unsuccessfully.  She denies any abdominal pain, nausea, vomiting, dysuria or hematuria, fever or chills.  The bleeding now is just minimal spotting.  Patient has not established care for this pregnancy yet.  Past Medical History:  Diagnosis Date  . History of cesarean section 11/03/2016  . Hypertension    With past pregnancy  . Ovarian cyst     Patient Active Problem List   Diagnosis Date Noted  . Fibrocystic breast changes, right 12/06/2019  . History of pregnancy induced hypertension 11/03/2016    Past Surgical History:  Procedure Laterality Date  . APPENDECTOMY    . CESAREAN SECTION N/A 09/2011  . CESAREAN SECTION N/A 09/27/2018   Procedure: CESAREAN SECTION repeat;  Surgeon: Hildred Laser, MD;  Location: [facility] ORS;  Service: Obstetrics;  Laterality: N/A;  . LAPAROSCOPIC APPENDECTOMY N/A 07/23/2015   Procedure: APPENDECTOMY LAPAROSCOPIC;  Surgeon: Lattie Haw, MD;  Location: [facility] ORS;  Service: General;  Laterality: N/A;  . WISDOM TOOTH EXTRACTION      Prior to Admission medications   Not on File    Allergies Patient has no known allergies.  Family History  Problem Relation Age of Onset  . GER disease Mother   . Hypertension Mother   . Cancer Maternal Grandfather   . Diabetes Maternal Grandfather   . Hypertension Maternal Grandfather     Social History Social History   Tobacco Use  . Smoking status:  Former Smoker    Packs/day: 0.25    Types: Cigars  . Smokeless tobacco: Never Used  . Tobacco comment: smoke black and milds  Vaping Use  . Vaping Use: Never used  Substance Use Topics  . Alcohol use: No    Alcohol/week: 0.0 standard drinks  . Drug use: Not Currently    Types: Marijuana    Comment: last used- 02/06/2018    Review of Systems  Constitutional: Negative for fever. Eyes: Negative for visual changes. ENT: Negative for sore throat. Neck: No neck pain  Cardiovascular: Negative for chest pain. Respiratory: Negative for shortness of breath. Gastrointestinal: Negative for abdominal pain, vomiting or diarrhea. Genitourinary: Negative for dysuria. + vaginal bleeding Musculoskeletal: Negative for back pain. Skin: Negative for rash. Neurological: Negative for headaches, weakness or numbness. Psych: No SI or HI  ____________________________________________   PHYSICAL EXAM:  VITAL SIGNS: ED Triage Vitals [07/03/20 1930]  Enc Vitals Group     BP 114/72     Pulse Rate 81     Resp 16     Temp 98.9 F (37.2 C)     Temp Source Oral     SpO2 100 %     Weight 153 lb (69.4 kg)     Height 5\' 3"  (1.6 m)     Head Circumference      Peak Flow      Pain Score 0     Pain Loc      Pain Edu?      Excl. in  GC?     Constitutional: Alert and oriented. Well appearing and in no apparent distress. HEENT:      Head: Normocephalic and atraumatic.         Eyes: Conjunctivae are normal. Sclera is non-icteric.       Mouth/Throat: Mucous membranes are moist.       Neck: Supple with no signs of meningismus. Cardiovascular: Regular rate and rhythm. No murmurs, gallops, or rubs.  Respiratory: Normal respiratory effort. Lungs are clear to auscultation bilaterally. Gastrointestinal: Soft, non tender, and non distended. Pelvic exam: Normal external genitalia, no rashes or lesions. Small amount of blood in the vaginal vault. Os closed. No cervical motion tenderness.  No uterine or adnexal  tenderness.   Musculoskeletal:  No edema, cyanosis, or erythema of extremities. Neurologic: Normal speech and language. Face is symmetric. Moving all extremities. No gross focal neurologic deficits are appreciated. Skin: Skin is warm, dry and intact. No rash noted. Psychiatric: Mood and affect are normal. Speech and behavior are normal.  ____________________________________________   LABS (all labs ordered are listed, but only abnormal results are displayed)  Labs Reviewed  HCG, QUANTITATIVE, PREGNANCY - Abnormal; Notable for the following components:      Result Value   hCG, Beta Chain, Quant, S 79,404 (*)    All other components within normal limits  CBC WITH DIFFERENTIAL/PLATELET - Abnormal; Notable for the following components:   Hemoglobin 11.1 (*)    HCT 33.1 (*)    All other components within normal limits  COMPREHENSIVE METABOLIC PANEL - Abnormal; Notable for the following components:   AST 14 (*)    All other components within normal limits  WET PREP, GENITAL  CHLAMYDIA/NGC RT PCR ([facility] ONLY)   ____________________________________________  EKG  none  ____________________________________________  RADIOLOGY  I have personally reviewed the images performed during this visit and I agree with the Radiologist's read.   Interpretation by Radiologist:  US OB LESS THAN 14 WEEKS WITH OB TRANSVAGINAL  Result Date: 07/03/2020 CLINICAL DATA:  Pregnant patient in first-trimester pregnancy with vaginal bleeding for 1 day. EXAM: OBSTETRIC <14 WK [place] AND TRANSVAGINAL OB US TECHNIQUE: Both transabdominal and transvaginal ultrasound examinations were performed for complete evaluation of the gestation as well as the maternal uterus, adnexal regions, and pelvic cul-de-sac. Transvaginal technique was performed to assess early pregnancy. COMPARISON:  None this pregnancy. FINDINGS: Intrauterine gestational sac: Single Yolk sac:  Visualized. Embryo:  Visualized. Cardiac Activity:  Visualized. Heart Rate: 158 bpm CRL:  9.3 mm   7 w   0 d                  [place] EDC: 02/19/2021 Subchorionic hemorrhage: Small measuring 2.3 x 0.8 x 2.4 cm, when using transvaginal measurements. Maternal uterus/adnexae: Right ovary is visualized and is normal. Blood flow is noted. Within the left ovary is a 2.4 cm thick walled cyst that may represent a corpus luteum. Normal blood flow. Trace free fluid in the pelvis. IMPRESSION: 1. Single live intrauterine gestation estimated gestational age [redacted] weeks 0 days based on crown-rump length for ultrasound [facility] 02/19/2021. 2. Small subchorionic hemorrhage. 3. Probable corpus luteal cyst in the left ovary. Electronically Signed   By: Narda Rutherford M.D.   On: 07/03/2020 21:37     ____________________________________________   PROCEDURES  Procedure(s) performed: None Procedures Critical Care performed:  None ____________________________________________   INITIAL IMPRESSION / ASSESSMENT AND PLAN / ED COURSE  24 y.o. female G40P2A1 currently at 7w per LMP  presents for  evaluation of vaginal bleeding.  Patient hemodynamically stable, normal hemoglobin, blood type O+ no indication for RhoGam.  Pelvic exam showing small amount of blood in the vaginal vault with a closed os.  Ultrasound visualized by me showing a normal IUP, confirmed by radiology is measuring [redacted] weeks gestational age with a small subchorionic hemorrhage.  Discussed pelvic rest and close follow-up with OB.  Discussed signs and symptoms of acute blood loss anemia and recommended return if these develop.  STD screening with wet prep, GC and chlamydia done today.  Patient will be contacted for results.  Old medical records review including most recent visit to the OB/GYN 2 weeks ago.  _________________________ 3:27 AM on 07/04/2020 -----------------------------------------   STD screening negative    _____________________________________________ Please note:  Patient was evaluated in Emergency  Department today for the symptoms described in the history of present illness. Patient was evaluated in the context of the global COVID-19 pandemic, which necessitated consideration that the patient might be at risk for infection with the SARS-CoV-2 virus that causes COVID-19. Institutional protocols and algorithms that pertain to the evaluation of patients at risk for COVID-19 are in a state of rapid change based on information released by regulatory bodies including the CDC and federal and state organizations. These policies and algorithms were followed during the patient's care in the ED.  Some ED evaluations and interventions may be delayed as a result of limited staffing during the pandemic.   [place] Controlled Substance Database was reviewed by me. ____________________________________________   FINAL CLINICAL IMPRESSION(S) / ED DIAGNOSES   Final diagnoses:  Vaginal bleeding in pregnancy  Threatened miscarriage  Subchorionic hemorrhage of placenta in first trimester, single or unspecified fetus      NEW MEDICATIONS STARTED DURING THIS VISIT:  ED Discharge Orders    None       Note:  This document was prepared using Dragon voice recognition software and may include unintentional dictation errors.    Nita Sickle, MD 07/04/20 [phone]

## 2020-07-08 ENCOUNTER — Ambulatory Visit (INDEPENDENT_AMBULATORY_CARE_PROVIDER_SITE_OTHER): Payer: Medicaid Other | Admitting: Obstetrics and Gynecology

## 2020-07-08 ENCOUNTER — Ambulatory Visit (INDEPENDENT_AMBULATORY_CARE_PROVIDER_SITE_OTHER): Payer: Medicaid Other

## 2020-07-08 ENCOUNTER — Other Ambulatory Visit: Payer: Self-pay

## 2020-07-08 VITALS — BP 104/68 | HR 81 | Ht 63.0 in | Wt 157.3 lb

## 2020-07-08 DIAGNOSIS — Z98891 History of uterine scar from previous surgery: Secondary | ICD-10-CM | POA: Diagnosis not present

## 2020-07-08 DIAGNOSIS — Z3201 Encounter for pregnancy test, result positive: Secondary | ICD-10-CM | POA: Diagnosis not present

## 2020-07-08 DIAGNOSIS — Z3A01 Less than 8 weeks gestation of pregnancy: Secondary | ICD-10-CM

## 2020-07-08 DIAGNOSIS — Z202 Contact with and (suspected) exposure to infections with a predominantly sexual mode of transmission: Secondary | ICD-10-CM

## 2020-07-08 DIAGNOSIS — Z8759 Personal history of other complications of pregnancy, childbirth and the puerperium: Secondary | ICD-10-CM

## 2020-07-08 DIAGNOSIS — O09299 Supervision of pregnancy with other poor reproductive or obstetric history, unspecified trimester: Secondary | ICD-10-CM

## 2020-07-08 DIAGNOSIS — Z8679 Personal history of other diseases of the circulatory system: Secondary | ICD-10-CM

## 2020-07-08 NOTE — Patient Instructions (Addendum)
Morning Sickness  Morning sickness is when you feel sick to your stomach (nauseous) during pregnancy. You may feel sick to your stomach and throw up (vomit). You may feel sick in the morning, but you can feel this way at any time of day. Some women feel very sick to their stomach and cannot stop throwing up (hyperemesis gravidarum). Follow these instructions at home: Medicines  Take over-the-counter and prescription medicines only as told by your doctor. Do not take any medicines until you talk with your doctor about them first.  Taking multivitamins before getting pregnant can stop or lessen the harshness of morning sickness. Eating and drinking  Eat dry toast or crackers before getting out of bed.  Eat 5 or 6 small meals a day.  Eat dry and bland foods like rice and baked potatoes.  Do not eat greasy, fatty, or spicy foods.  Have someone cook for you if the smell of food causes you to feel sick or throw up.  If you feel sick to your stomach after taking prenatal vitamins, take them at night or with a snack.  Eat protein when you need a snack. Nuts, yogurt, and cheese are good choices.  Drink fluids throughout the day.  Try ginger ale made with real ginger, ginger tea made from fresh grated ginger, or ginger candies. General instructions  Do not use any products that have nicotine or tobacco in them, such as cigarettes and e-cigarettes. If you need help quitting, ask your doctor.  Use an air purifier to keep the air in your house free of smells.  Get lots of fresh air.  Try to avoid smells that make you feel sick.  Try: ? Wearing a bracelet that is used for seasickness (acupressure wristband). ? Going to a doctor who puts thin needles into certain body points (acupuncture) to improve how you feel. Contact a doctor if:  You need medicine to feel better.  You feel dizzy or light-headed.  You are losing weight. Get help right away if:  You feel very sick to your  stomach and cannot stop throwing up.  You pass out (faint).  You have very bad pain in your belly. Summary  Morning sickness is when you feel sick to your stomach (nauseous) during pregnancy.  You may feel sick in the morning, but you can feel this way at any time of day.  Making some changes to what you eat may help your symptoms go away. This information is not intended to replace advice given to you by your health care provider. Make sure you discuss any questions you have with your health care provider. Document Revised: 06/03/2017 Document Reviewed: 07/22/2016 Elsevier Patient Education  2020 [employer]. How a Baby Grows During Pregnancy  Pregnancy begins when a female's sperm enters a female's egg (fertilization). Fertilization usually happens in one of the tubes (fallopian tubes) that connect the ovaries to the womb (uterus). The fertilized egg moves down the fallopian tube to the uterus. Once it reaches the uterus, it implants into the lining of the uterus and begins to grow. For the first 10 weeks, the fertilized egg is called an embryo. After 10 weeks, it is called a fetus. As the fetus continues to grow, it receives oxygen and nutrients through tissue (placenta) that grows to support the developing baby. The placenta is the life support system for the baby. It provides oxygen and nutrition and removes waste. Learning as much as you can about your pregnancy and how your baby  is developing can help you enjoy the experience. It can also make you aware of when there might be a problem and when to ask questions. How long does a typical pregnancy last? A pregnancy usually lasts 280 days, or about 40 weeks. Pregnancy is divided into three periods of growth, also called trimesters:  First trimester: 0-12 weeks.  Second trimester: 13-27 weeks.  Third trimester: 28-40 weeks. The day when your baby is ready to be born (full term) is your estimated date of delivery. How does my baby  develop month by month? First month  The fertilized egg attaches to the inside of the uterus.  Some cells will form the placenta. Others will form the fetus.  The arms, legs, brain, spinal cord, lungs, and heart begin to develop.  At the end of the first month, the heart begins to beat. Second month  The bones, inner ear, eyelids, hands, and feet form.  The genitals develop.  By the end of 8 weeks, all major organs are developing. Third month  All of the internal organs are forming.  Teeth develop below the gums.  Bones and muscles begin to grow. The spine can flex.  The skin is transparent.  Fingernails and toenails begin to form.  Arms and legs continue to grow longer, and hands and feet develop.  The fetus is about 3 inches (7.6 cm) long. Fourth month  The placenta is completely formed.  The external sex organs, neck, outer ear, eyebrows, eyelids, and fingernails are formed.  The fetus can hear, swallow, and move its arms and legs.  The kidneys begin to produce urine.  The skin is covered with a white, waxy coating (vernix) and very fine hair (lanugo). Fifth month  The fetus moves around more and can be felt for the first time (quickening).  The fetus starts to sleep and wake up and may begin to suck its finger.  The nails grow to the end of the fingers.  The organ in the digestive system that makes bile (gallbladder) functions and helps to digest nutrients.  If your baby is a girl, eggs are present in her ovaries. If your baby is a boy, testicles start to move down into his scrotum. Sixth month  The lungs are formed.  The eyes open. The brain continues to develop.  Your baby has fingerprints and toe prints. Your baby's hair grows thicker.  At the end of the second trimester, the fetus is about 9 inches (22.9 cm) long. Seventh month  The fetus kicks and stretches.  The eyes are developed enough to sense changes in light.  The hands can make a  grasping motion.  The fetus responds to sound. Eighth month  All organs and body systems are fully developed and functioning.  Bones harden, and taste buds develop. The fetus may hiccup.  Certain areas of the brain are still developing. The skull remains soft. Ninth month  The fetus gains about  lb (0.23 kg) each week.  The lungs are fully developed.  Patterns of sleep develop.  The fetus's head typically moves into a head-down position (vertex) in the uterus to prepare for birth.  The fetus weighs 6-9 lb (2.72-4.08 kg) and is 19-20 inches (48.26-50.8 cm) long. What can I do to have a healthy pregnancy and help my baby develop? General instructions  Take prenatal vitamins as directed by your health care provider. These include vitamins such as folic acid, iron, calcium, and vitamin D. They are important for healthy development.  Take medicines only as directed by your health care provider. Read labels and ask a pharmacist or your health care provider whether over-the-counter medicines, supplements, and prescription drugs are safe to take during pregnancy.  Keep all follow-up visits as directed by your health care provider. This is important. Follow-up visits include prenatal care and screening tests. How do I know if my baby is developing well? At each prenatal visit, your health care provider will do several different tests to check on your health and keep track of your baby's development. These include:  Fundal height and position. ? Your health care provider will measure your growing belly from your pubic bone to the top of the uterus using a tape measure. ? Your health care provider will also feel your belly to determine your baby's position.  Heartbeat. ? An ultrasound in the first trimester can confirm pregnancy and show a heartbeat, depending on how far along you are. ? Your health care provider will check your baby's heart rate at every prenatal visit.  Second  trimester ultrasound. ? This ultrasound checks your baby's development. It also may show your baby's gender. What should I do if I have concerns about my baby's development? Always talk with your health care provider about any concerns that you may have about your pregnancy and your baby. Summary  A pregnancy usually lasts 280 days, or about 40 weeks. Pregnancy is divided into three periods of growth, also called trimesters.  Your health care provider will monitor your baby's growth and development throughout your pregnancy.  Follow your health care provider's recommendations about taking prenatal vitamins and medicines during your pregnancy.  Talk with your health care provider if you have any concerns about your pregnancy or your developing baby. This information is not intended to replace advice given to you by your health care provider. Make sure you discuss any questions you have with your health care provider. Document Revised: 10/12/2018 Document Reviewed: 05/04/2017 Elsevier Patient Education  2020 [place] of Pregnancy The first trimester of pregnancy is from week 1 until the end of week 13 (months 1 through 3). A week after a sperm fertilizes an egg, the egg will implant on the wall of the uterus. This embryo will begin to develop into a baby. Genes from you and your partner will form the baby. The female genes will determine whether the baby will be a boy or a girl. At 6-8 weeks, the eyes and face will be formed, and the heartbeat can be seen on ultrasound. At the end of 12 weeks, all the baby's organs will be formed. Now that you are pregnant, you will want to do everything you can to have a healthy baby. Two of the most important things are to get good prenatal care and to follow your health care provider's instructions. Prenatal care is all the medical care you receive before the baby's birth. This care will help prevent, find, and treat any problems during the  pregnancy and childbirth. Body changes during your first trimester Your body goes through many changes during pregnancy. The changes vary from woman to woman.  You may gain or lose a couple of pounds at first.  You may feel sick to your stomach (nauseous) and you may throw up (vomit). If the vomiting is uncontrollable, call your health care provider.  You may tire easily.  You may develop headaches that can be relieved by medicines. All medicines should be approved by your health care provider.  You may urinate more often. Painful urination may mean you have a bladder infection.  You may develop heartburn as a result of your pregnancy.  You may develop constipation because certain hormones are causing the muscles that push stool through your intestines to slow down.  You may develop hemorrhoids or swollen veins (varicose veins).  Your breasts may begin to grow larger and become tender. Your nipples may stick out more, and the tissue that surrounds them (areola) may become darker.  Your gums may bleed and may be sensitive to brushing and flossing.  Dark spots or blotches (chloasma, mask of pregnancy) may develop on your face. This will likely fade after the baby is born.  Your menstrual periods will stop.  You may have a loss of appetite.  You may develop cravings for certain kinds of food.  You may have changes in your emotions from day to day, such as being excited to be pregnant or being concerned that something may go wrong with the pregnancy and baby.  You may have more vivid and strange dreams.  You may have changes in your hair. These can include thickening of your hair, rapid growth, and changes in texture. Some women also have hair loss during or after pregnancy, or hair that feels dry or thin. Your hair will most likely return to normal after your baby is born. What to expect at prenatal visits During a routine prenatal visit:  You will be weighed to make sure you and  the baby are growing normally.  Your blood pressure will be taken.  Your abdomen will be measured to track your baby's growth.  The fetal heartbeat will be listened to between weeks 10 and 14 of your pregnancy.  Test results from any previous visits will be discussed. Your health care provider may ask you:  How you are feeling.  If you are feeling the baby move.  If you have had any abnormal symptoms, such as leaking fluid, bleeding, severe headaches, or abdominal cramping.  If you are using any tobacco products, including cigarettes, chewing tobacco, and electronic cigarettes.  If you have any questions. Other tests that may be performed during your first trimester include:  Blood tests to find your blood type and to check for the presence of any previous infections. The tests will also be used to check for low iron levels (anemia) and protein on red blood cells (Rh antibodies). Depending on your risk factors, or if you previously had diabetes during pregnancy, you may have tests to check for high blood sugar that affects pregnant women (gestational diabetes).  Urine tests to check for infections, diabetes, or protein in the urine.  An ultrasound to confirm the proper growth and development of the baby.  Fetal screens for spinal cord problems (spina bifida) and Down syndrome.  HIV (human immunodeficiency virus) testing. Routine prenatal testing includes screening for HIV, unless you choose not to have this test.  You may need other tests to make sure you and the baby are doing well. Follow these instructions at home: Medicines  Follow your health care provider's instructions regarding medicine use. Specific medicines may be either safe or unsafe to take during pregnancy.  Take a prenatal vitamin that contains at least 600 micrograms (mcg) of folic acid.  If you develop constipation, try taking a stool softener if your health care provider approves. Eating and  drinking   Eat a balanced diet that includes fresh fruits and vegetables, whole grains, good sources of protein  such as meat, eggs, or tofu, and low-fat dairy. Your health care provider will help you determine the amount of weight gain that is right for you.  Avoid raw meat and uncooked cheese. These carry germs that can cause birth defects in the baby.  Eating four or five small meals rather than three large meals a day may help relieve nausea and vomiting. If you start to feel nauseous, eating a few soda crackers can be helpful. Drinking liquids between meals, instead of during meals, also seems to help ease nausea and vomiting.  Limit foods that are high in fat and processed sugars, such as fried and sweet foods.  To prevent constipation: ? Eat foods that are high in fiber, such as fresh fruits and vegetables, whole grains, and beans. ? Drink enough fluid to keep your urine clear or pale yellow. Activity  Exercise only as directed by your health care provider. Most women can continue their usual exercise routine during pregnancy. Try to exercise for 30 minutes at least 5 days a week. Exercising will help you: ? Control your weight. ? Stay in shape. ? Be prepared for labor and delivery.  Experiencing pain or cramping in the lower abdomen or lower back is a good sign that you should stop exercising. Check with your health care provider before continuing with normal exercises.  Try to avoid standing for long periods of time. Move your legs often if you must stand in one place for a long time.  Avoid heavy lifting.  Wear low-heeled shoes and practice good posture.  You may continue to have sex unless your health care provider tells you not to. Relieving pain and discomfort  Wear a good support bra to relieve breast tenderness.  Take warm sitz baths to soothe any pain or discomfort caused by hemorrhoids. Use hemorrhoid cream if your health care provider approves.  Rest with your  legs elevated if you have leg cramps or low back pain.  If you develop varicose veins in your legs, wear support hose. Elevate your feet for 15 minutes, 3-4 times a day. Limit salt in your diet. Prenatal care  Schedule your prenatal visits by the twelfth week of pregnancy. They are usually scheduled monthly at first, then more often in the last 2 months before delivery.  Write down your questions. Take them to your prenatal visits.  Keep all your prenatal visits as told by your health care provider. This is important. Safety  Wear your seat belt at all times when driving.  Make a list of emergency phone numbers, including numbers for family, friends, the hospital, and police and fire departments. General instructions  Ask your health care provider for a referral to a local prenatal education class. Begin classes no later than the beginning of month 6 of your pregnancy.  Ask for help if you have counseling or nutritional needs during pregnancy. Your health care provider can offer advice or refer you to specialists for help with various needs.  Do not use hot tubs, steam rooms, or saunas.  Do not douche or use tampons or scented sanitary pads.  Do not cross your legs for long periods of time.  Avoid cat litter boxes and soil used by cats. These carry germs that can cause birth defects in the baby and possibly loss of the fetus by miscarriage or stillbirth.  Avoid all smoking, herbs, alcohol, and medicines not prescribed by your health care provider. Chemicals in these products affect the formation and growth of the  baby.  Do not use any products that contain nicotine or tobacco, such as cigarettes and e-cigarettes. If you need help quitting, ask your health care provider. You may receive counseling support and other resources to help you quit.  Schedule a dentist appointment. At home, brush your teeth with a soft toothbrush and be gentle when you floss. Contact a health care provider  if:  You have dizziness.  You have mild pelvic cramps, pelvic pressure, or nagging pain in the abdominal area.  You have persistent nausea, vomiting, or diarrhea.  You have a bad smelling vaginal discharge.  You have pain when you urinate.  You notice increased swelling in your face, hands, legs, or ankles.  You are exposed to fifth disease or chickenpox.  You are exposed to [place] measles (rubella) and have never had it. Get help right away if:  You have a fever.  You are leaking fluid from your vagina.  You have spotting or bleeding from your vagina.  You have severe abdominal cramping or pain.  You have rapid weight gain or loss.  You vomit blood or material that looks like coffee grounds.  You develop a severe headache.  You have shortness of breath.  You have any kind of trauma, such as from a fall or a car accident. Summary  The first trimester of pregnancy is from week 1 until the end of week 13 (months 1 through 3).  Your body goes through many changes during pregnancy. The changes vary from woman to woman.  You will have routine prenatal visits. During those visits, your health care provider will examine you, discuss any test results you may have, and talk with you about how you are feeling. This information is not intended to replace advice given to you by your health care provider. Make sure you discuss any questions you have with your health care provider. Document Revised: 06/03/2017 Document Reviewed: 06/02/2016 Elsevier Patient Education  [place]. Common Medications Safe in Pregnancy  Acne:      Constipation:  Benzoyl Peroxide     Colace  Clindamycin      Dulcolax Suppository  Topica Erythromycin     Fibercon  Salicylic Acid      Metamucil         Miralax AVOID:        Senakot   Accutane    Cough:  Retin-A       Cough Drops  Tetracycline      Phenergan w/ Codeine if Rx  Minocycline      Robitussin (Plain &  DM)  Antibiotics:     Crabs/Lice:  Ceclor       RID  Cephalosporins    AVOID:  E-Mycins      Kwell  Keflex  Macrobid/Macrodantin   Diarrhea:  Penicillin      Kao-Pectate  Zithromax      Imodium AD         PUSH FLUIDS AVOID:       Cipro     Fever:  Tetracycline      Tylenol (Regular or Extra  Minocycline       Strength)  Levaquin      Extra Strength-Do not          Exceed 8 tabs/24 hrs Caffeine:        '200mg'$ /day (equiv. To 1 cup of coffee or  approx. 3 12 oz sodas)         Gas: Cold/Hayfever:  Gas-X  Benadryl      Mylicon  Claritin       Phazyme  **Claritin-D        Chlor-Trimeton    Headaches:  Dimetapp      ASA-Free Excedrin  Drixoral-Non-Drowsy     Cold Compress  Mucinex (Guaifenasin)     Tylenol (Regular or Extra  Sudafed/Sudafed-12 Hour     Strength)  **Sudafed PE Pseudoephedrine   Tylenol Cold & Sinus     Vicks Vapor Rub  Zyrtec  **AVOID if Problems With Blood Pressure         Heartburn: Avoid lying down for at least 1 hour after meals  Aciphex      Maalox     Rash:  Milk of Magnesia     Benadryl    Mylanta       1% Hydrocortisone Cream  Pepcid  Pepcid Complete   Sleep Aids:  Prevacid      Ambien   Prilosec       Benadryl  Rolaids       Chamomile Tea  Tums (Limit 4/day)     Unisom         Tylenol PM         Warm milk-add vanilla or  Hemorrhoids:       Sugar for taste  Anusol/Anusol H.C.  (RX: Analapram 2.5%)  Sugar Substitutes:  Hydrocortisone OTC     Ok in moderation  Preparation H      Tucks        Vaseline lotion applied to tissue with wiping    Herpes:     Throat:  Acyclovir      Oragel  Famvir  Valtrex     Vaccines:         Flu Shot Leg Cramps:       *Gardasil  Benadryl      Hepatitis A         Hepatitis B Nasal Spray:       Pneumovax  Saline Nasal Spray     Polio Booster         Tetanus Nausea:       Tuberculosis test or PPD  Vitamin B6 25 mg TID   AVOID:    Dramamine      *Gardasil  Emetrol       Live Poliovirus  Ginger  Root 250 mg QID    MMR (measles, mumps &  High Complex Carbs @ Bedtime    rebella)  Sea Bands-Accupressure    Varicella (Chickenpox)  Unisom 1/2 tab TID     *No known complications           If received before Pain:         Known pregnancy;   Darvocet       Resume series after  Lortab        Delivery  Percocet    Yeast:   Tramadol      Femstat  Tylenol 3      Gyne-lotrimin  Ultram       Monistat  Vicodin           MISC:         All Sunscreens           Hair Coloring/highlights          Insect Repellant's          (Including DEET)         Administrator, [facility]  [place], [place], [place] [address]  Phone: [phone]   [facility] (second location)  [address] [place], [place] [address]  Phone: [phone]   [facility]) [place], [place], [place] [address] Phone: [phone]   [place] [address] [place], [place] [address]  Phone: [phone]

## 2020-07-08 NOTE — Progress Notes (Signed)
Ellie Lunch presents for NOB nurse interview visit. Pregnancy confirmation done 06/18/20 by [facility].  G- 4.  [id number]    . Dating scan today - edd 02/20/2021.   Pregnancy education material explained and given. __0_ cats in the home. NOB labs ordered. Sickle cell, HIV labs, Drug screen were explained and ordered.   PNV encouraged. Genetic screening options discussed. Genetic testing: to discuss with [facility] at NOB PE.   Pt. To follow up with provider in _4_ weeks for NOB physical.  FMLA form explained and signed. Financial policy reviewed. + for nausea. NO meds needed at this time. N/V protocol discussed.  Declines covid and flu vaccine. [facility] noted on ED scan dated 12/30. NO VB today.   All questions answered.

## 2020-07-09 LAB — URINALYSIS, ROUTINE W REFLEX MICROSCOPIC
Bilirubin, UA: NEGATIVE
Glucose, UA: NEGATIVE
Ketones, UA: NEGATIVE
Leukocytes,UA: NEGATIVE
Nitrite, UA: NEGATIVE
Protein,UA: NEGATIVE
RBC, UA: NEGATIVE
Specific Gravity, UA: >=1.03 — AB (ref 1.005–1.030)
Urobilinogen, Ur: 1 mg/dL (ref 0.2–1.0)
pH, UA: 5.5 (ref 5.0–7.5)

## 2020-07-09 LAB — HCV INTERPRETATION

## 2020-07-09 LAB — ABO AND RH: Rh Factor: POSITIVE

## 2020-07-09 LAB — VARICELLA ZOSTER ANTIBODY, IGG: Varicella zoster IgG: <135 index — ABNORMAL LOW (ref >165)

## 2020-07-09 LAB — VIRAL HEPATITIS HBV, HCV
HCV Ab: <0.1 s/co ratio (ref 0.0–0.9)
Hep B Core Total Ab: NEGATIVE
Hep B Surface Ab, Qual: NONREACTIVE
Hepatitis B Surface Ag: NEGATIVE

## 2020-07-09 LAB — ANTIBODY SCREEN: Antibody Screen: NEGATIVE

## 2020-07-09 LAB — HGB SOLU + RFLX FRAC: Sickle Solubility Test - HGBRFX: NEGATIVE

## 2020-07-09 LAB — RPR: RPR Ser Ql: NONREACTIVE

## 2020-07-09 LAB — RUBELLA SCREEN: Rubella Antibodies, IGG: 0.99 index — ABNORMAL LOW (ref >0.99)

## 2020-07-09 LAB — HIV ANTIBODY (ROUTINE TESTING W REFLEX): HIV Screen 4th Generation wRfx: NONREACTIVE

## 2020-07-10 LAB — URINE CULTURE, OB REFLEX

## 2020-07-10 LAB — GC/CHLAMYDIA PROBE AMP
Chlamydia trachomatis, NAA: NEGATIVE
Neisseria Gonorrhoeae by PCR: NEGATIVE

## 2020-07-10 LAB — CULTURE, OB URINE

## 2020-07-20 ENCOUNTER — Encounter: Payer: Self-pay | Admitting: Obstetrics and Gynecology

## 2020-07-26 LAB — MONITOR DRUG PROFILE 14(MW)
Amphetamine Scrn, Ur: NEGATIVE ng/mL
BARBITURATE SCREEN URINE: NEGATIVE ng/mL
BENZODIAZEPINE SCREEN, URINE: NEGATIVE ng/mL
Buprenorphine, Urine: NEGATIVE ng/mL
Cocaine (Metab) Scrn, Ur: NEGATIVE ng/mL
Creatinine(Crt), U: 267.8 mg/dL (ref 20.0–300.0)
Fentanyl, Urine: NEGATIVE pg/mL
Meperidine Screen, Urine: NEGATIVE ng/mL
Methadone Screen, Urine: NEGATIVE ng/mL
OXYCODONE+OXYMORPHONE UR QL SCN: NEGATIVE ng/mL
Opiate Scrn, Ur: NEGATIVE ng/mL
Ph of Urine: 5.7 (ref 4.5–8.9)
Phencyclidine Qn, Ur: NEGATIVE ng/mL
Propoxyphene Scrn, Ur: NEGATIVE ng/mL
SPECIFIC GRAVITY: 1.032
Tramadol Screen, Urine: NEGATIVE ng/mL

## 2020-07-26 LAB — NICOTINE SCREEN, URINE: Cotinine Ql Scrn, Ur: NEGATIVE ng/mL

## 2020-07-26 LAB — CANNABINOID (GC/MS), URINE
Cannabinoid: POSITIVE — AB
Carboxy THC (GC/MS): 21 ng/mL

## 2020-07-27 ENCOUNTER — Emergency Department
Admission: EM | Admit: 2020-07-27 | Discharge: 2020-07-27 | Disposition: A | Discharge status: Home / Self Care | Payer: Medicaid Other | Attending: Emergency Medicine | Admitting: Emergency Medicine

## 2020-07-27 ENCOUNTER — Other Ambulatory Visit: Payer: Self-pay

## 2020-07-27 ENCOUNTER — Emergency Department: Payer: Medicaid Other

## 2020-07-27 DIAGNOSIS — N3090 Cystitis, unspecified without hematuria: Secondary | ICD-10-CM | POA: Diagnosis not present

## 2020-07-27 DIAGNOSIS — O2311 Infections of bladder in pregnancy, first trimester: Secondary | ICD-10-CM | POA: Diagnosis not present

## 2020-07-27 DIAGNOSIS — O131 Gestational [pregnancy-induced] hypertension without significant proteinuria, first trimester: Secondary | ICD-10-CM | POA: Insufficient documentation

## 2020-07-27 DIAGNOSIS — O26891 Other specified pregnancy related conditions, first trimester: Secondary | ICD-10-CM | POA: Insufficient documentation

## 2020-07-27 DIAGNOSIS — Z87891 Personal history of nicotine dependence: Secondary | ICD-10-CM | POA: Diagnosis not present

## 2020-07-27 DIAGNOSIS — R1011 Right upper quadrant pain: Secondary | ICD-10-CM | POA: Insufficient documentation

## 2020-07-27 DIAGNOSIS — Z3A10 10 weeks gestation of pregnancy: Secondary | ICD-10-CM | POA: Diagnosis not present

## 2020-07-27 DIAGNOSIS — R1031 Right lower quadrant pain: Secondary | ICD-10-CM

## 2020-07-27 LAB — POC URINE PREG, ED: Preg Test, Ur: POSITIVE — AB

## 2020-07-27 LAB — COMPREHENSIVE METABOLIC PANEL
ALT: 20 U/L (ref 0–44)
AST: 15 U/L (ref 15–41)
Albumin: 3.3 g/dL — ABNORMAL LOW (ref 3.5–5.0)
Alkaline Phosphatase: 34 U/L — ABNORMAL LOW (ref 38–126)
Anion gap: 9 (ref 5–15)
BUN: 6 mg/dL (ref 6–20)
CO2: 24 mmol/L (ref 22–32)
Calcium: 8.7 mg/dL — ABNORMAL LOW (ref 8.9–10.3)
Chloride: 100 mmol/L (ref 98–111)
Creatinine, Ser: 0.56 mg/dL (ref 0.44–1.00)
GFR, Estimated: >60 mL/min (ref >60)
Glucose, Bld: 104 mg/dL — ABNORMAL HIGH (ref 70–99)
Potassium: 3.2 mmol/L — ABNORMAL LOW (ref 3.5–5.1)
Sodium: 133 mmol/L — ABNORMAL LOW (ref 135–145)
Total Bilirubin: 0.5 mg/dL (ref 0.3–1.2)
Total Protein: 7.2 g/dL (ref 6.5–8.1)

## 2020-07-27 LAB — URINALYSIS, COMPLETE (UACMP) WITH MICROSCOPIC
Bilirubin Urine: NEGATIVE
Glucose, UA: NEGATIVE mg/dL
Ketones, ur: 80 mg/dL — AB
Nitrite: NEGATIVE
Protein, ur: 100 mg/dL — AB
Specific Gravity, Urine: 1.025 (ref 1.005–1.030)
WBC, UA: >50 WBC/hpf — ABNORMAL HIGH (ref 0–5)
pH: 5 (ref 5.0–8.0)

## 2020-07-27 LAB — WET PREP, GENITAL
Sperm: NONE SEEN
Trich, Wet Prep: NONE SEEN
Yeast Wet Prep HPF POC: NONE SEEN

## 2020-07-27 LAB — CHLAMYDIA/NGC RT PCR (ARMC ONLY)
Chlamydia Tr: NOT DETECTED
N gonorrhoeae: NOT DETECTED

## 2020-07-27 LAB — HCG, QUANTITATIVE, PREGNANCY: hCG, Beta Chain, Quant, S: 234376 m[IU]/mL — ABNORMAL HIGH (ref <5)

## 2020-07-27 LAB — CBC
HCT: 29 % — ABNORMAL LOW (ref 36.0–46.0)
Hemoglobin: 9.8 g/dL — ABNORMAL LOW (ref 12.0–15.0)
MCH: 27.5 pg (ref 26.0–34.0)
MCHC: 33.8 g/dL (ref 30.0–36.0)
MCV: 81.2 fL (ref 80.0–100.0)
Platelets: 194 10*3/uL (ref 150–400)
RBC: 3.57 MIL/uL — ABNORMAL LOW (ref 3.87–5.11)
RDW: 12.1 % (ref 11.5–15.5)
WBC: 12.3 10*3/uL — ABNORMAL HIGH (ref 4.0–10.5)
nRBC: 0 % (ref 0.0–0.2)

## 2020-07-27 LAB — TYPE AND SCREEN
ABO/RH(D): O POS
Antibody Screen: NEGATIVE

## 2020-07-27 LAB — LIPASE, BLOOD: Lipase: 21 U/L (ref 11–51)

## 2020-07-27 MED ORDER — METOCLOPRAMIDE HCL 10 MG PO TABS: 10.0000 mg | ORAL_TABLET | Freq: Four times a day (QID) | ORAL | 0 refills | Status: DC | PRN

## 2020-07-27 MED ORDER — CEPHALEXIN 500 MG PO CAPS: 500.0000 mg | ORAL_CAPSULE | Freq: Four times a day (QID) | ORAL | 0 refills | Status: AC

## 2020-07-27 NOTE — ED Notes (Signed)
First Nurse Note: Pt to ED the via POV c/o back and abdominal pain. Pt is in NAD. Pt is [redacted] weeks pregnant.

## 2020-07-27 NOTE — Discharge Instructions (Addendum)
Your lab tests show a urinary tract infection and are otherwise reassuring.  Your ultrasounds show a normal gallbladder and a viable pregnancy in the normal location in your uterus.    Take keflex for the UTI and follow up with your doctor in one week for reassessment. You can take reglan (metoclopramide) for nausea as well.

## 2020-07-27 NOTE — ED Triage Notes (Signed)
Pt from home, states she is [redacted] weeks pregnant. C/o constant abdominal pain and lower back pain. States she had vaginal discharge yesterday, blood tinged clear discharge, small amount. Denies vaginal discharge today.Pt states this is her 4 th pregnancy, 3rd pregnancy resulted in miscarriage.  Pt states she has nausea but denies emesis.

## 2020-07-27 NOTE — ED Provider Notes (Addendum)
[facility] Emergency Department Provider Note  ____________________________________________   Event Date/Time   First MD Initiated Contact with Patient 07/27/20 1415     (approximate)  I have reviewed the triage vital signs and the nursing notes.   HISTORY  Chief Complaint Abdominal Pain    HPI Brittany Young is a 25 y.o. female who is [redacted] weeks pregnant who comes in for abdominal pain. Pt has had 2 c-sections and appendix removed. Pain started a few days ago. Whole abdomen but mostly on the left lower back and the entire front. No relief with tylenol, nothing makes it worse. Some discharge as well.  Denies any urinary symptoms.  She does report that her child had COVID 2 weeks ago.          Past Medical History:  Diagnosis Date  . History of cesarean section 11/03/2016  . Hypertension    With past pregnancy  . Ovarian cyst     Patient Active Problem List   Diagnosis Date Noted  . Fibrocystic breast changes, right 12/06/2019  . History of pregnancy induced hypertension 11/03/2016    Past Surgical History:  Procedure Laterality Date  . APPENDECTOMY    . CESAREAN SECTION N/A 09/2011  . CESAREAN SECTION N/A 09/27/2018   Procedure: CESAREAN SECTION repeat;  Surgeon: Hildred Laser, MD;  Location: [facility] ORS;  Service: Obstetrics;  Laterality: N/A;  . LAPAROSCOPIC APPENDECTOMY N/A 07/23/2015   Procedure: APPENDECTOMY LAPAROSCOPIC;  Surgeon: Lattie Haw, MD;  Location: [facility] ORS;  Service: General;  Laterality: N/A;  . WISDOM TOOTH EXTRACTION      Prior to Admission medications   Medication Sig Start Date End Date Taking? Authorizing Provider  Prenatal Vit-Fe Fumarate-FA (PRENATAL VITAMIN) 27-0.8 MG TABS Take by mouth.    [provider]    Allergies Patient has no known allergies.  Family History  Problem Relation Age of Onset  . GER disease Mother   . Hypertension Mother   . Cancer Maternal Grandfather   . Diabetes  Maternal Grandfather   . Hypertension Maternal Grandfather   . Breast cancer Neg Hx   . Ovarian cancer Neg Hx   . Colon cancer Neg Hx     Social History Social History   Tobacco Use  . Smoking status: Former Smoker    Packs/day: 0.25    Types: Cigars  . Smokeless tobacco: Never Used  . Tobacco comment: smoke black and milds  Vaping Use  . Vaping Use: Never used  Substance Use Topics  . Alcohol use: No    Alcohol/week: 0.0 standard drinks  . Drug use: Not Currently    Types: Marijuana    Comment: last used- 02/06/2018      Review of Systems Constitutional: No fever/chills Eyes: No visual changes. ENT: No sore throat. Cardiovascular: Denies chest pain. Respiratory: Denies shortness of breath. Gastrointestinal: Positive abdominal pain no nausea, no vomiting.  No diarrhea.  No constipation. Genitourinary: Negative for dysuria.vaginal discharge Musculoskeletal: Negative for back pain. Skin: Negative for rash. Neurological: Negative for headaches, focal weakness or numbness. All other ROS negative ____________________________________________   PHYSICAL EXAM:  VITAL SIGNS: ED Triage Vitals  Enc Vitals Group     BP 07/27/20 1330 110/68     Pulse Rate 07/27/20 1330 (!) 105     Resp 07/27/20 1330 16     Temp 07/27/20 1330 99 F (37.2 C)     Temp src --      SpO2 07/27/20 1330 100 %  Weight 07/27/20 1330 160 lb (72.6 kg)     Height 07/27/20 1330 5\' 3"  (1.6 m)     Head Circumference --      Peak Flow --      Pain Score 07/27/20 1338 10     Pain Loc --      Pain Edu? --      Excl. in GC? --     Constitutional: Alert and oriented. Well appearing and in no acute distress. Eyes: Conjunctivae are normal. EOMI. Head: Atraumatic. Nose: No congestion/rhinnorhea. Mouth/Throat: Mucous membranes are moist.   Neck: No stridor. Trachea Midline. FROM Cardiovascular: Normal rate, regular rhythm. Grossly normal heart sounds.  Good peripheral circulation. Respiratory:  Normal respiratory effort.  No retractions. Lungs CTAB. Gastrointestinal: Patient reports tenderness throughout her abdomen. No distention. No abdominal bruits.  Musculoskeletal: No lower extremity tenderness nor edema.  No joint effusions. Neurologic:  Normal speech and language. No gross focal neurologic deficits are appreciated.  Skin:  Skin is warm, dry and intact. No rash noted. Psychiatric: Mood and affect are normal. Speech and behavior are normal. GU: mild discharge no cervical motional tenderness or adnexal tenderness Mild cva tenderness  ____________________________________________   LABS (all labs ordered are listed, but only abnormal results are displayed)  Labs Reviewed  COMPREHENSIVE METABOLIC PANEL - Abnormal; Notable for the following components:      Result Value   Sodium 133 (*)    Potassium 3.2 (*)    Glucose, Bld 104 (*)    Calcium 8.7 (*)    Albumin 3.3 (*)    Alkaline Phosphatase 34 (*)    All other components within normal limits  CBC - Abnormal; Notable for the following components:   WBC 12.3 (*)    RBC 3.57 (*)    Hemoglobin 9.8 (*)    HCT 29.0 (*)    All other components within normal limits  URINALYSIS, COMPLETE (UACMP) WITH MICROSCOPIC - Abnormal; Notable for the following components:   Color, Urine YELLOW (*)    APPearance CLOUDY (*)    Hgb urine dipstick MODERATE (*)    Ketones, ur 80 (*)    Protein, ur 100 (*)    Leukocytes,Ua LARGE (*)    WBC, UA >50 (*)    Bacteria, UA RARE (*)    All other components within normal limits  POC URINE PREG, ED - Abnormal; Notable for the following components:   Preg Test, Ur POSITIVE (*)    All other components within normal limits  WET PREP, GENITAL  CHLAMYDIA/NGC RT PCR ([facility] ONLY)  URINE CULTURE  LIPASE, BLOOD  HCG, QUANTITATIVE, PREGNANCY  TYPE AND SCREEN   ____________________________________________   RADIOLOGY pending  PROCEDURES  Procedure(s) performed (including Critical  Care):  Procedures   ____________________________________________   INITIAL IMPRESSION / ASSESSMENT AND PLAN / ED COURSE  Brittany Young was evaluated in Emergency Department on 07/27/2020 for the symptoms described in the history of present illness. She was evaluated in the context of the global COVID-19 pandemic, which necessitated consideration that the patient might be at risk for infection with the SARS-CoV-2 virus that causes COVID-19. Institutional protocols and algorithms that pertain to the evaluation of patients at risk for COVID-19 are in a state of rapid change based on information released by regulatory bodies including the CDC and federal and state organizations. These policies and algorithms were followed during the patient's care in the ED.    Patient is a 25 year old who comes in with abdominal pain the  setting being [redacted] weeks pregnant.  She reports some blood-tinged discharge to the nurse but stated that just normal discharge to me.  Will get labs to evaluate for electrolyte abnormalities, AKI. Hard to tell where pain is located since pt states it over whole abdomen.  Will get US GB and [place] to rule out torsion or issue with fetus.  Will do Pelvic to evalute for PID. Also consider secondary to covid given recent + contact with her son.  UA does show signs for UTI. Will at least rx for pyelo given some flank tenderness. NO h/o kidney stones and pain doesn't seem to suggest kidney stone. Pt already had appendix removed so unlikely that. Pt with some initial low grade temp and tachycardia but appears well and not septic and tolerates PO.    Pelvic exam mild discharge but no evidence of PID  Pt handed off to oncoming team pending US/covid swab and re-peat evaluation.   I did see the patient is clue cells were positive after had left.  I did prescribe her Flagyl and have pharmacy call in a prescription.         ____________________________________________   FINAL CLINICAL  IMPRESSION(S) / ED DIAGNOSES   Final diagnoses:  RUQ pain  Cystitis      MEDICATIONS GIVEN DURING THIS VISIT:  Medications - No data to display   ED Discharge Orders         Ordered    cephALEXin (KEFLEX) 500 MG capsule  4 times daily        07/27/20 1622    metoCLOPramide (REGLAN) 10 MG tablet  Every 6 hours PRN        07/27/20 1622           Note:  This document was prepared using Dragon voice recognition software and may include unintentional dictation errors.   Concha Se, MD 07/27/20 1709    Concha Se, MD 07/28/20 [phone]

## 2020-07-30 LAB — URINE CULTURE: Culture: >=100000 — AB

## 2020-08-07 ENCOUNTER — Other Ambulatory Visit: Payer: Self-pay

## 2020-08-07 ENCOUNTER — Encounter: Payer: Self-pay | Admitting: Obstetrics and Gynecology

## 2020-08-07 ENCOUNTER — Ambulatory Visit (INDEPENDENT_AMBULATORY_CARE_PROVIDER_SITE_OTHER): Payer: Medicaid Other | Admitting: Obstetrics and Gynecology

## 2020-08-07 VITALS — BP 109/70 | HR 92 | Ht 63.0 in | Wt 158.4 lb

## 2020-08-07 DIAGNOSIS — O99891 Other specified diseases and conditions complicating pregnancy: Secondary | ICD-10-CM

## 2020-08-07 DIAGNOSIS — Z3A12 12 weeks gestation of pregnancy: Secondary | ICD-10-CM

## 2020-08-07 DIAGNOSIS — Z98891 History of uterine scar from previous surgery: Secondary | ICD-10-CM

## 2020-08-07 DIAGNOSIS — Z3491 Encounter for supervision of normal pregnancy, unspecified, first trimester: Secondary | ICD-10-CM

## 2020-08-07 DIAGNOSIS — Z8759 Personal history of other complications of pregnancy, childbirth and the puerperium: Secondary | ICD-10-CM

## 2020-08-07 DIAGNOSIS — Z283 Underimmunization status: Secondary | ICD-10-CM

## 2020-08-07 DIAGNOSIS — F1211 Cannabis abuse, in remission: Secondary | ICD-10-CM

## 2020-08-07 DIAGNOSIS — Z87440 Personal history of urinary (tract) infections: Secondary | ICD-10-CM

## 2020-08-07 DIAGNOSIS — O09899 Supervision of other high risk pregnancies, unspecified trimester: Secondary | ICD-10-CM

## 2020-08-07 LAB — POCT URINALYSIS DIPSTICK OB
Bilirubin, UA: NEGATIVE
Blood, UA: NEGATIVE
Glucose, UA: NEGATIVE
Ketones, UA: NEGATIVE
Leukocytes, UA: NEGATIVE
Nitrite, UA: NEGATIVE
POC,PROTEIN,UA: NEGATIVE
Spec Grav, UA: 1.015 (ref 1.010–1.025)
Urobilinogen, UA: 0.2 E.U./dL
pH, UA: 7 (ref 5.0–8.0)

## 2020-08-07 MED ORDER — ASPIRIN EC 81 MG PO TBEC: 81.0000 mg | DELAYED_RELEASE_TABLET | Freq: Every day | ORAL | 2 refills | Status: DC

## 2020-08-07 NOTE — Patient Instructions (Signed)
How a Baby Grows During Pregnancy Pregnancy begins when a female's sperm enters a female's egg. This is called fertilization. Fertilization usually happens in one of the fallopian tubes that connect the ovaries to the uterus. The fertilized egg moves down the fallopian tube to the uterus. Once it reaches the uterus, it implants into the lining of the uterus and begins to grow. For the first 8 weeks, the fertilized egg is called an embryo. After 8 weeks, it is called a fetus. As the fetus continues to grow, it receives oxygen and nutrients through the placenta, which is an organ that grows to support the developing baby. The placenta is the life support system for the baby. It provides oxygen and nutrition and removes waste. How long does a typical pregnancy last? A pregnancy usually lasts 280 days, or about 40 weeks. Pregnancy is divided into three periods of growth, also called trimesters:  First trimester: 0-12 weeks.  Second trimester: 13-27 weeks.  Third trimester: 28-40 weeks. The day when your baby is ready to be born (full term) is your estimated date of delivery. However, most babies are not born on their estimated date of delivery. How does my baby develop month by month? First month  The fertilized egg attaches to the inside of the uterus.  Some cells will form the placenta. Others will form the fetus.  The arms, legs, brain, spinal cord, lungs, and heart begin to develop.  At the end of the first month, the heart begins to beat. Second month  The bones, inner ear, eyelids, hands, and feet form.  The genitals develop.  By the end of 8 weeks, all major organs are developing. Third month  All of the internal organs are forming.  Teeth develop below the gums.  Bones and muscles begin to grow. The spine can flex.  The skin is transparent.  Fingernails and toenails begin to form.  Arms and legs continue to grow longer, and hands and feet develop.  The fetus is about 3  inches (7.6 cm) long. Fourth month  The placenta is completely formed.  The external sex organs, neck, outer ear, eyebrows, eyelids, and fingernails are formed.  The fetus can hear, swallow, and move its arms and legs.  The kidneys begin to produce urine.  The skin is covered with a white, waxy coating (vernix) and very fine hair (lanugo). Fifth month  The fetus moves around more and can be felt for the first time (quickening).  The fetus starts to sleep and wake up and may begin to suck a finger.  The nails grow to the end of the fingers.  The organ in the digestive system that makes bile (gallbladder) functions and helps to digest nutrients.  If the fetus is a female, eggs are present in the ovaries. If the fetus is a female, testicles start to move down into the scrotum. Sixth month  The lungs are formed.  The eyes open. The brain continues to develop.  Your baby has fingerprints and toe prints. Your baby's hair grows thicker.  At the end of the second trimester, the fetus is about 9 inches (22.9 cm) long. Seventh month  The fetus kicks and stretches.  The eyes are developed enough to sense changes in light.  The hands can make a grasping motion.  The fetus responds to sound. Eighth month  Most organs and body systems are fully developed and functioning.  Bones harden, and taste buds develop. The fetus may hiccup.  Certain areas  of the brain are still developing. The skull remains soft. Ninth month  The fetus gains about  lb (0.23 kg) each week.  The lungs are fully developed.  Patterns of sleep develop.  The fetus's head typically moves into a head-down position (vertex) in the uterus to prepare for birth.  The fetus weighs 6-9 lb (2.72-4.08 kg) and is 19-20 inches (48.26-50.8 cm) long.   How do I know if my baby is developing well? Always talk with your health care provider about any concerns that you may have about your pregnancy and your baby. At each  prenatal visit, your health care provider will do several different tests to check on your health and keep track of your baby's development. These include:  Fundal height and position. To do this, your health care provider will: ? Measure your growing belly from your pubic bone to the top of the uterus using a tape measure. ? Feel your belly to determine your baby's position.  Heartbeat. An ultrasound in the first trimester can confirm pregnancy and show a heartbeat, depending on how far along you are. Your health care provider will check your baby's heart rate at every prenatal visit. You will also have a second trimester ultrasound to check your baby's development. Follow these instructions at home:  Take prenatal vitamins as told by your health care provider. These include vitamins such as folic acid, iron, calcium, and vitamin D. They are important for healthy development.  Take over-the-counter and prescription medicines only as told by your health care provider.  Keep all follow-up visits. This is important. Follow-up visits include prenatal care and screening tests. Summary  A pregnancy usually lasts 280 days, or about 40 weeks. Pregnancy is divided into three periods of growth, also called trimesters.  Your health care provider will monitor your baby's growth and development throughout your pregnancy.  Follow your health care provider's recommendations about taking prenatal vitamins and medicines during your pregnancy.  Talk with your health care provider if you have any concerns about your pregnancy or your developing baby. This information is not intended to replace advice given to you by your health care provider. Make sure you discuss any questions you have with your health care provider. Document Revised: 11/28/2019 Document Reviewed: 10/04/2019 Elsevier Patient Education  2021 [place] of Pregnancy  The second trimester of pregnancy is from week  13 through week 27. This is months 4 through 6 of pregnancy. The second trimester is often a time when you feel your best. Your body has adjusted to being pregnant, and you begin to feel better physically. During the second trimester:  Morning sickness has lessened or stopped completely.  You may have more energy.  You may have an increase in appetite. The second trimester is also a time when the unborn baby (fetus) is growing rapidly. At the end of the sixth month, the fetus may be up to 12 inches long and weigh about 1 pounds. You will likely begin to feel the baby move (quickening) between 16 and 20 weeks of pregnancy. Body changes during your second trimester Your body continues to go through many changes during your second trimester. The changes vary and generally return to normal after the baby is born. Physical changes  Your weight will continue to increase. You will notice your lower abdomen bulging out.  You may begin to get stretch marks on your hips, abdomen, and breasts.  Your breasts will continue to grow and to  become tender.  Dark spots or blotches (chloasma or mask of pregnancy) may develop on your face.  A dark line from your belly button to the pubic area (linea nigra) may appear.  You may have changes in your hair. These can include thickening of your hair, rapid growth, and changes in texture. Some people also have hair loss during or after pregnancy, or hair that feels dry or thin. Health changes  You may develop headaches.  You may have heartburn.  You may develop constipation.  You may develop hemorrhoids or swollen, bulging veins (varicose veins).  Your gums may bleed and may be sensitive to brushing and flossing.  You may urinate more often because the fetus is pressing on your bladder.  You may have back pain. This is caused by: ? Weight gain. ? Pregnancy hormones that are relaxing the joints in your pelvis. ? A shift in weight and the muscles that  support your balance. Follow these instructions at home: Medicines  Follow your health care provider's instructions regarding medicine use. Specific medicines may be either safe or unsafe to take during pregnancy. Do not take any medicines unless approved by your health care provider.  Take a prenatal vitamin that contains at least 600 micrograms (mcg) of folic acid. Eating and drinking  Eat a healthy diet that includes fresh fruits and vegetables, whole grains, good sources of protein such as meat, eggs, or tofu, and low-fat dairy products.  Avoid raw meat and unpasteurized juice, milk, and cheese. These carry germs that can harm you and your baby.  You may need to take these actions to prevent or treat constipation: ? Drink enough fluid to keep your urine pale yellow. ? Eat foods that are high in fiber, such as beans, whole grains, and fresh fruits and vegetables. ? Limit foods that are high in fat and processed sugars, such as fried or sweet foods. Activity  Exercise only as directed by your health care provider. Most people can continue their usual exercise routine during pregnancy. Try to exercise for 30 minutes at least 5 days a week. Stop exercising if you develop contractions in your uterus.  Stop exercising if you develop pain or cramping in the lower abdomen or lower back.  Avoid exercising if it is very hot or humid or if you are at a high altitude.  Avoid heavy lifting.  If you choose to, you may have sex unless your health care provider tells you not to. Relieving pain and discomfort  Wear a supportive bra to prevent discomfort from breast tenderness.  Take warm sitz baths to soothe any pain or discomfort caused by hemorrhoids. Use hemorrhoid cream if your health care provider approves.  Rest with your legs raised (elevated) if you have leg cramps or low back pain.  If you develop varicose veins: ? Wear support hose as told by your health care provider. ? Elevate  your feet for 15 minutes, 3-4 times a day. ? Limit salt in your diet. Safety  Wear your seat belt at all times when driving or riding in a car.  Talk with your health care provider if someone is verbally or physically abusive to you. Lifestyle  Do not use hot tubs, steam rooms, or saunas.  Do not douche. Do not use tampons or scented sanitary pads.  Avoid cat litter boxes and soil used by cats. These carry germs that can cause birth defects in the baby and possibly loss of the fetus by miscarriage or stillbirth.  Do not use herbal remedies, alcohol, illegal drugs, or medicines that are not approved by your health care provider. Chemicals in these products can harm your baby.  Do not use any products that contain nicotine or tobacco, such as cigarettes, e-cigarettes, and chewing tobacco. If you need help quitting, ask your health care provider. General instructions  During a routine prenatal visit, your health care provider will do a physical exam and other tests. He or she will also discuss your overall health. Keep all follow-up visits. This is important.  Ask your health care provider for a referral to a local prenatal education class.  Ask for help if you have counseling or nutritional needs during pregnancy. Your health care provider can offer advice or refer you to specialists for help with various needs. Where to find more information  American Pregnancy Association: americanpregnancy.[place] and Gynecologists: PoolDevices.com.pt  Office on [employer]: KeywordPortfolios.com.br Contact a health care provider if you have:  A headache that does not go away when you take medicine.  Vision changes or you see spots in front of your eyes.  Mild pelvic cramps, pelvic pressure, or nagging pain in the abdominal area.  Persistent nausea, vomiting, or diarrhea.  A bad-smelling vaginal discharge or foul-smelling urine.  Pain  when you urinate.  Sudden or extreme swelling of your face, hands, ankles, feet, or legs.  A fever. Get help right away if you:  Have fluid leaking from your vagina.  Have spotting or bleeding from your vagina.  Have severe abdominal cramping or pain.  Have difficulty breathing.  Have chest pain.  Have fainting spells.  Have not felt your baby move for the time period told by your health care provider.  Have new or increased pain, swelling, or redness in an arm or leg. Summary  The second trimester of pregnancy is from week 13 through week 27 (months 4 through 6).  Do not use herbal remedies, alcohol, illegal drugs, or medicines that are not approved by your health care provider. Chemicals in these products can harm your baby.  Exercise only as directed by your health care provider. Most people can continue their usual exercise routine during pregnancy.  Keep all follow-up visits. This is important. This information is not intended to replace advice given to you by your health care provider. Make sure you discuss any questions you have with your health care provider. Document Revised: 11/28/2019 Document Reviewed: 10/04/2019 Elsevier Patient Education  2021 [place].  Common Medications Safe in Pregnancy  Acne:      Constipation:  Benzoyl Peroxide     Colace  Clindamycin      Dulcolax Suppository  Topica Erythromycin     Fibercon  Salicylic Acid      Metamucil         Miralax AVOID:        Senakot   Accutane    Cough:  Retin-A       Cough Drops  Tetracycline      Phenergan w/ Codeine if Rx  Minocycline      Robitussin (Plain & DM)  Antibiotics:     Crabs/Lice:  Ceclor       RID  Cephalosporins    AVOID:  E-Mycins      Kwell  Keflex  Macrobid/Macrodantin   Diarrhea:  Penicillin      Kao-Pectate  Zithromax      Imodium AD         PUSH FLUIDS AVOID:  Cipro     Fever:  Tetracycline      Tylenol (Regular or  Extra  Minocycline       Strength)  Levaquin      Extra Strength-Do not          Exceed 8 tabs/24 hrs Caffeine:        '200mg'$ /day (equiv. To 1 cup of coffee or  approx. 3 12 oz sodas)         Gas: Cold/Hayfever:       Gas-X  Benadryl      Mylicon  Claritin       Phazyme  **Claritin-D        Chlor-Trimeton    Headaches:  Dimetapp      ASA-Free Excedrin  Drixoral-Non-Drowsy     Cold Compress  Mucinex (Guaifenasin)     Tylenol (Regular or Extra  Sudafed/Sudafed-12 Hour     Strength)  **Sudafed PE Pseudoephedrine   Tylenol Cold & Sinus     Vicks Vapor Rub  Zyrtec  **AVOID if Problems With Blood Pressure         Heartburn: Avoid lying down for at least 1 hour after meals  Aciphex      Maalox     Rash:  Milk of Magnesia     Benadryl    Mylanta       1% Hydrocortisone Cream  Pepcid  Pepcid Complete   Sleep Aids:  Prevacid      Ambien   Prilosec       Benadryl  Rolaids       Chamomile Tea  Tums (Limit 4/day)     Unisom         Tylenol PM         Warm milk-add vanilla or  Hemorrhoids:       Sugar for taste  Anusol/Anusol H.C.  (RX: Analapram 2.5%)  Sugar Substitutes:  Hydrocortisone OTC     Ok in moderation  Preparation H      Tucks        Vaseline lotion applied to tissue with wiping    Herpes:     Throat:  Acyclovir      Oragel  Famvir  Valtrex     Vaccines:         Flu Shot Leg Cramps:       *Gardasil  Benadryl      Hepatitis A         Hepatitis B Nasal Spray:       Pneumovax  Saline Nasal Spray     Polio Booster         Tetanus Nausea:       Tuberculosis test or PPD  Vitamin B6 25 mg TID   AVOID:    Dramamine      *Gardasil  Emetrol       Live Poliovirus  Ginger Root 250 mg QID    MMR (measles, mumps &  High Complex Carbs @ Bedtime    rebella)  Sea Bands-Accupressure    Varicella (Chickenpox)  Unisom 1/2 tab TID     *No known complications           If received before Pain:         Known pregnancy;   Darvocet       Resume series  after  Lortab        Delivery  Percocet    Yeast:   Tramadol      Femstat  Tylenol 3  Gyne-lotrimin  Ultram       Monistat  Vicodin           MISC:         All Sunscreens           Hair Coloring/highlights          Insect Repellant's          (Including DEET)         Mystic Tans

## 2020-08-07 NOTE — Progress Notes (Signed)
OBSTETRIC INITIAL PRENATAL VISIT  Subjective:    Brittany Young is being seen today for her first obstetrical visit.  This is an unplanned pregnancy. She is a 25 y.o. [id number] female at [redacted]w[redacted]d gestation, Estimated Date of Delivery: 02/19/21 with Patient's last menstrual period was 05/15/2020 (exact date),  consistent with 7 week sono. Her obstetrical history is significant for marijuana abuse (recent cessation), history of C-section x 2, h/o placental abruption in last pregnancy resulting in preterm birth at 17 weeks. Also had PP hypertension in first pregnancy. Recent UTI treated (E. Coli). Relationship with FOB: significant other, living together. Patient does intend to breast feed and formula feed. Pregnancy history fully reviewed.    OB History  Gravida Para Term Preterm AB Living  4 2 1 1 1 2   SAB IAB Ectopic Multiple Live Births  1 0 0 0 2    # Outcome Date GA Lbr Len/2nd Weight Sex Delivery Anes PTL Lv  4 Current           3 SAB 01/27/20 [redacted]w[redacted]d         2 Preterm 09/28/18 [redacted]w[redacted]d  5 lb 1.8 oz (2.32 kg) F CS-LTranv Spinal  LIV     Name: Ogden,GIRL Lauree     Apgar1: 8  Apgar5: 9  1 Term 2013 [redacted]w[redacted]d  6 lb 12 oz (3.062 kg) M CS-LTranv   LIV     Complications: Cephalopelvic Disproportion    Obstetric Comments  G1- C-section for CPD.  Had GHTN, was induced.   G2 - placental abruption.  Postpartum HTN    Gynecologic History:  Last pap smear was 09/2018.  Results were normal.  Denies/admits h/o abnormal pap smears in the past.  Denies history of STIs.  Contraception: None  Past Medical History:  Diagnosis Date  . History of cesarean section 11/03/2016  . Hypertension    With past pregnancy  . Ovarian cyst     Family History  Problem Relation Age of Onset  . GER disease Mother   . Hypertension Mother   . Cancer Maternal Grandfather   . Diabetes Maternal Grandfather   . Hypertension Maternal Grandfather   . Breast cancer Neg Hx   . Ovarian cancer Neg Hx   . Colon  cancer Neg Hx     Past Surgical History:  Procedure Laterality Date  . APPENDECTOMY    . CESAREAN SECTION N/A 09/2011  . CESAREAN SECTION N/A 09/27/2018   Procedure: CESAREAN SECTION repeat;  Surgeon: 09/29/2018, MD;  Location: [facility] ORS;  Service: Obstetrics;  Laterality: N/A;  . LAPAROSCOPIC APPENDECTOMY N/A 07/23/2015   Procedure: APPENDECTOMY LAPAROSCOPIC;  Surgeon: 07/25/2015, MD;  Location: [facility] ORS;  Service: General;  Laterality: N/A;  . WISDOM TOOTH EXTRACTION      Social History   Socioeconomic History  . Marital status: Single    Spouse name: Lattie Haw  . Number of children: Not on file  . Years of education: Not on file  . Highest education level: Not on file  Occupational History  . Not on file  Tobacco Use  . Smoking status: Former Smoker    Packs/day: 0.25    Types: Cigars  . Smokeless tobacco: Never Used  . Tobacco comment: smoke black and milds  Vaping Use  . Vaping Use: Never used  Substance and Sexual Activity  . Alcohol use: No    Alcohol/week: 0.0 standard drinks  . Drug use: Not Currently    Types: Marijuana    Comment:  last used- 02/06/2018  . Sexual activity: Yes    Birth control/protection: None  Other Topics Concern  . Not on file  Social History Narrative  . Not on file   Social Determinants of Health   Financial Resource Strain: Not on file  Food Insecurity: Not on file  Transportation Needs: Not on file  Physical Activity: Not on file  Stress: Not on file  Social Connections: Not on file  Intimate Partner Violence: Not on file    Current Outpatient Medications on File Prior to Visit  Medication Sig Dispense Refill  . metoCLOPramide (REGLAN) 10 MG tablet Take 1 tablet (10 mg total) by mouth every 6 (six) hours as needed. 30 tablet 0  . Prenatal Vit-Fe Fumarate-FA (PRENATAL VITAMIN) 27-0.8 MG TABS Take by mouth.     No current facility-administered medications on file prior to visit.    No Known Allergies   Review of  Systems General: Not Present- Fever, Weight Loss and Weight Gain. Skin: Not Present- Rash. HEENT: Not Present- Blurred Vision, Headache and Bleeding Gums. Respiratory: Not Present- Difficulty Breathing. Breast: Not Present- Breast Mass. Present - Breast tenderness.  Cardiovascular: Not Present- Chest Pain, Elevated Blood Pressure, Fainting / Blacking Out and Shortness of Breath. Gastrointestinal: Not Present- Abdominal Pain, Constipation, Nausea and Vomiting. Female Genitourinary: Not Present- Frequency, Painful Urination, Pelvic Pain, Vaginal Bleeding, Vaginal Discharge, Contractions, regular, Fetal Movements Decreased, Urinary Complaints and Vaginal Fluid. Musculoskeletal: Not Present- Back Pain and Leg Cramps. Neurological: Not Present- Dizziness. Psychiatric: Not Present- Depression.     Objective:   Blood pressure 109/70, pulse 92, weight 158 lb 6.4 oz (71.8 kg), last menstrual period 05/15/2020, not currently breastfeeding.  Body mass index is 28.06 kg/m.  General Appearance:    Alert, cooperative, no distress, appears stated age, overweight  Head:    Normocephalic, without obvious abnormality, atraumatic  Eyes:    PERRL, conjunctiva/corneas clear, EOM's intact, both eyes  Ears:    Normal external ear canals, both ears  Nose:   Nares normal, septum midline, mucosa normal, no drainage or sinus tenderness  Throat:   Lips, mucosa, and tongue normal; teeth and gums normal  Neck:   Supple, symmetrical, trachea midline, no adenopathy; thyroid: no enlargement/tenderness/nodules; no carotid bruit or JVD  Back:     Symmetric, no curvature, ROM normal, no CVA tenderness  Lungs:     Clear to auscultation bilaterally, respirations unlabored  Chest Wall:    No tenderness or deformity   Heart:    Regular rate and rhythm, S1 and S2 normal, no murmur, rub or gallop  Breast Exam:    No tenderness, masses, or nipple abnormality  Abdomen:     Soft, non-tender, bowel sounds active all four  quadrants, no masses, no organomegaly.  FH 12.  FHT 160 bpm.  Genitalia:    Pelvic:external genitalia normal, vagina without lesions, discharge, or tenderness, rectovaginal septum  normal. Cervix normal in appearance, no cervical motion tenderness, no adnexal masses or tenderness.  Pregnancy positive findings: uterine enlargement: 12 wk size, nontender.   Rectal:    Normal external sphincter.  No hemorrhoids appreciated. Internal exam not done.   Extremities:   Extremities normal, atraumatic, no cyanosis or edema  Pulses:   2+ and symmetric all extremities  Skin:   Skin color, texture, turgor normal, no rashes or lesions  Lymph nodes:   Cervical, supraclavicular, and axillary nodes normal  Neurologic:   CNII-XII intact, normal strength, sensation and reflexes throughout  Assessment:   1. Prenatal care in first trimester   2. [redacted] weeks gestation of pregnancy   3. Rubella non-immune status, antepartum   4. History of cesarean section   5. History of pregnancy induced hypertension   6. History of placental abruption   7. History of UTI   8. History of preterm delivery, currently pregnant     Plan:   1. Supervision of normal pregnancy  - Initial labs reviewed.  Rubella equivocal, recommend repeat. Ordered today.  - Prenatal vitamins encouraged. - Problem list reviewed and updated. - New OB counseling:  The patient has been given an overview regarding routine prenatal care.  Recommendations regarding diet, weight gain, and exercise in pregnancy were given. - Prenatal testing, optional genetic testing, and ultrasound use in pregnancy were reviewed.   - Benefits of Breast Feeding were discussed. The patient is encouraged to consider nursing her baby post partum.  2. History of cesarean section  - Patient with prior C-section x 2. Discussed option of TOLAC or repeat C-section, patient prefers repeat C-section.   3. History of pregnancy induced hypertension  - Postpartum HTN noted in  both pregnancies. Will initiate aspirin 81 mg.   6. History of placental abruption  - Patient at risk of abruption in future pregnancies. Led to preterm delivery with last birth. Continue to monitor closely.   7. History of UTI  - E. Coli UTI treated in the ER last month.  Will repeat urine culture today.   8. Marijuana use - Tequila reports recent cessation.    9. History of preterm delivery - Due to placental abruption. Not a candidate for 17-P injection.   The patient has Medicaid.  [facility] Medicaid Risk Screening Form completed today  Follow up in 4 weeks.

## 2020-08-07 NOTE — Progress Notes (Signed)
OB-Pt present for PE. Pt stated that she was doing well and would like to have Natera for genetic testing. N[employer]a completed today.

## 2020-08-09 LAB — CBC
Hematocrit: 30.6 % — ABNORMAL LOW (ref 34.0–46.6)
Hemoglobin: 10.1 g/dL — ABNORMAL LOW (ref 11.1–15.9)
MCH: 26.9 pg (ref 26.6–33.0)
MCHC: 33 g/dL (ref 31.5–35.7)
MCV: 81 fL (ref 79–97)
Platelets: 366 10*3/uL (ref 150–450)
RBC: 3.76 x10E6/uL — ABNORMAL LOW (ref 3.77–5.28)
RDW: 12.7 % (ref 11.7–15.4)
WBC: 7.3 10*3/uL (ref 3.4–10.8)

## 2020-08-09 LAB — URINE CULTURE

## 2020-08-09 LAB — RUBELLA SCREEN: Rubella Antibodies, IGG: 0.94 index — ABNORMAL LOW (ref >0.99)

## 2020-08-27 ENCOUNTER — Telehealth: Payer: Self-pay | Admitting: Obstetrics and Gynecology

## 2020-08-27 DIAGNOSIS — O285 Abnormal chromosomal and genetic finding on antenatal screening of mother: Secondary | ICD-10-CM

## 2020-08-27 NOTE — Telephone Encounter (Signed)
Contacted patient regarding genetic testing results.  Patient with atypical finding on sex chromosomes.  Patient still does not desire to know the gender at this time.  She also had similar genetic findings with her last pregnancy (concerns for Monsomy X).  Discussed recommendations for genetic counseling and likely ultrasound by MFM again this pregnancy.  Patient notes understanding.  Will place referral.    Hildred Laser, MD [facility]

## 2020-09-04 ENCOUNTER — Other Ambulatory Visit: Payer: Self-pay

## 2020-09-04 ENCOUNTER — Ambulatory Visit (INDEPENDENT_AMBULATORY_CARE_PROVIDER_SITE_OTHER): Payer: Medicaid Other | Admitting: Obstetrics and Gynecology

## 2020-09-04 ENCOUNTER — Encounter: Payer: Self-pay | Admitting: Obstetrics and Gynecology

## 2020-09-04 VITALS — BP 106/66 | HR 89 | Wt 161.0 lb

## 2020-09-04 DIAGNOSIS — Z3482 Encounter for supervision of other normal pregnancy, second trimester: Secondary | ICD-10-CM

## 2020-09-04 DIAGNOSIS — N3001 Acute cystitis with hematuria: Secondary | ICD-10-CM

## 2020-09-04 DIAGNOSIS — Z3A16 16 weeks gestation of pregnancy: Secondary | ICD-10-CM

## 2020-09-04 LAB — POCT URINALYSIS DIPSTICK OB
Bilirubin, UA: NEGATIVE
Glucose, UA: NEGATIVE
Ketones, UA: NEGATIVE
Nitrite, UA: POSITIVE
Odor: NEGATIVE
POC,PROTEIN,UA: NEGATIVE
Spec Grav, UA: 1.02 (ref 1.010–1.025)
Urobilinogen, UA: 0.2 E.U./dL
pH, UA: 6.5 (ref 5.0–8.0)

## 2020-09-04 MED ORDER — NITROFURANTOIN MONOHYD MACRO 100 MG PO CAPS: 100.0000 mg | ORAL_CAPSULE | Freq: Two times a day (BID) | ORAL | 0 refills | Status: DC

## 2020-09-04 NOTE — Progress Notes (Signed)
ROB: Patient has no complaints, no urinary symptoms.  Nitrites present-will prescribe Macrobid and send urine for C&S.  Patient states no one has called her regarding [facility] genetic screen.  Information given.  Patient wants sex of baby in envelope for gender reveal-given.  Patient states she wants repeat cesarean delivery. Desires AFP - "next visit"

## 2020-09-04 NOTE — Progress Notes (Signed)
ROB- no complaints. + nitrites and blood in urine. Culture sent.

## 2020-09-06 LAB — URINE CULTURE

## 2020-09-10 ENCOUNTER — Other Ambulatory Visit: Payer: Self-pay

## 2020-09-10 DIAGNOSIS — Z3482 Encounter for supervision of other normal pregnancy, second trimester: Secondary | ICD-10-CM

## 2020-09-10 DIAGNOSIS — O285 Abnormal chromosomal and genetic finding on antenatal screening of mother: Secondary | ICD-10-CM

## 2020-09-30 ENCOUNTER — Telehealth: Payer: Self-pay | Admitting: Obstetrics and Gynecology

## 2020-09-30 NOTE — Telephone Encounter (Signed)
Please fax results. I do not see them in the chart.

## 2020-09-30 NOTE — Telephone Encounter (Signed)
Gavin Pound called requesting we fax over the panorama results for this pt- pt has apt with [facility]- genetic 3-32 and they need for her apt. Results are not scanned in media and I did not see in scan box upfront. I can fax if you know where results are!

## 2020-10-01 NOTE — Telephone Encounter (Signed)
SK faxed information to MFM.

## 2020-10-02 ENCOUNTER — Ambulatory Visit (INDEPENDENT_AMBULATORY_CARE_PROVIDER_SITE_OTHER): Payer: Medicaid Other | Admitting: Obstetrics and Gynecology

## 2020-10-02 ENCOUNTER — Ambulatory Visit: Payer: Medicaid Other

## 2020-10-02 ENCOUNTER — Encounter: Payer: Self-pay | Admitting: Obstetrics and Gynecology

## 2020-10-02 ENCOUNTER — Other Ambulatory Visit: Payer: Self-pay

## 2020-10-02 VITALS — BP 118/70 | HR 97 | Wt 164.7 lb

## 2020-10-02 DIAGNOSIS — Z98891 History of uterine scar from previous surgery: Secondary | ICD-10-CM

## 2020-10-02 DIAGNOSIS — Z87440 Personal history of urinary (tract) infections: Secondary | ICD-10-CM

## 2020-10-02 DIAGNOSIS — Z3A20 20 weeks gestation of pregnancy: Secondary | ICD-10-CM

## 2020-10-02 DIAGNOSIS — O285 Abnormal chromosomal and genetic finding on antenatal screening of mother: Secondary | ICD-10-CM

## 2020-10-02 DIAGNOSIS — Z3482 Encounter for supervision of other normal pregnancy, second trimester: Secondary | ICD-10-CM

## 2020-10-02 LAB — POCT URINALYSIS DIPSTICK OB
Bilirubin, UA: NEGATIVE
Blood, UA: NEGATIVE
Glucose, UA: NEGATIVE
Ketones, UA: NEGATIVE
Leukocytes, UA: NEGATIVE
Nitrite, UA: NEGATIVE
POC,PROTEIN,UA: NEGATIVE
Spec Grav, UA: 1.01 (ref 1.010–1.025)
Urobilinogen, UA: 0.2 E.U./dL
pH, UA: 6.5 (ref 5.0–8.0)

## 2020-10-02 NOTE — Progress Notes (Signed)
ROB: Doing well, no complaints.  For anatomy scan later today with MFM due to h/o abnormal Natera screening (patient also had abnormalities in prior pregnancy). Desires to breast and formula feed. H/o E. Coli UTI in 1st trimester, TOC today.  RTC in 4 weeks.

## 2020-10-02 NOTE — Patient Instructions (Signed)

## 2020-10-02 NOTE — Progress Notes (Signed)
OB-Pt present for routine prenatal care. Pt stated that she was having a lot of pressure in the vaginal area and braxton hick contraction.

## 2020-10-05 LAB — CULTURE, OB URINE

## 2020-10-05 LAB — URINE CULTURE, OB REFLEX

## 2020-10-07 ENCOUNTER — Other Ambulatory Visit: Payer: Self-pay

## 2020-10-07 ENCOUNTER — Ambulatory Visit: Payer: Medicaid Other | Attending: Obstetrics

## 2020-10-07 ENCOUNTER — Ambulatory Visit (HOSPITAL_BASED_OUTPATIENT_CLINIC_OR_DEPARTMENT_OTHER): Payer: Medicaid Other

## 2020-10-07 DIAGNOSIS — Z3482 Encounter for supervision of other normal pregnancy, second trimester: Secondary | ICD-10-CM

## 2020-10-07 DIAGNOSIS — O10012 Pre-existing essential hypertension complicating pregnancy, second trimester: Secondary | ICD-10-CM | POA: Diagnosis not present

## 2020-10-07 DIAGNOSIS — O285 Abnormal chromosomal and genetic finding on antenatal screening of mother: Secondary | ICD-10-CM | POA: Diagnosis not present

## 2020-10-07 DIAGNOSIS — O4590 Premature separation of placenta, unspecified, unspecified trimester: Secondary | ICD-10-CM | POA: Diagnosis not present

## 2020-10-07 DIAGNOSIS — O10019 Pre-existing essential hypertension complicating pregnancy, unspecified trimester: Secondary | ICD-10-CM | POA: Diagnosis not present

## 2020-10-07 DIAGNOSIS — O281 Abnormal biochemical finding on antenatal screening of mother: Secondary | ICD-10-CM | POA: Diagnosis not present

## 2020-10-07 DIAGNOSIS — Z3A20 20 weeks gestation of pregnancy: Secondary | ICD-10-CM

## 2020-10-07 DIAGNOSIS — Z36 Encounter for antenatal screening for chromosomal anomalies: Secondary | ICD-10-CM

## 2020-10-07 NOTE — Progress Notes (Signed)
Referring physician:  [facility] Length of consultation:  30 minutes  [name] was referred for a genetic counseling consultation as she had Panorama noninvasive prenatal screening (NIPS) through [place] that demonstrated an atypical finding on the sex chromosomes. The laboratory was unable to determine if this finding was suspected to be maternal or fetal in nature. These results also showed a less than 1 in 10,000 risk for trisomies 21, 18 and 13, a low risk for triploidy, and a low risk for the following microdeletions: 22q11.2, 1p36, Angelman syndrome, Cri-du-chat syndrome, and Prader-Willi syndrome. The patient elected not to be told the fetal gender.   We reviewed that NIPS analyzes cell-free fetal DNA from the placenta found in the maternal bloodstream during pregnancy. NIPS is used to determine the risk for missing or extra fetal/placental chromosomal material for a specific subset of chromosomes. However, maternal DNA is also present in a NIPS sample. Based on this, we discussed that there are several possibilities that could warrant [name]'s abnormal NIPS result. One possibility is the fetus having a genetic change involving the sex chromosomes. Another possibility is [name] herself having a genetic change involving her sex chromosomes. A second possibility is fetal or maternal mosaicism for a change involving the sex chromosomes. Mosaicism occurs when not every cell in the body is genetically identical; some cells in the body may have a genetic change involving the sex chromosomes, whereas others may have normal sex chromosomes. A third possibility is that of confined placental mosaicism, or a result representative of the placenta only rather than the fetus. Finally, it is possible that this is a false positive result.    We reviewed that even if the fetus were to have a sex chromosome abnormality, it would not be possible for [place] to predict if this would be a benign change or  one that would have implications for health or development based on this NIPS result alone. We also reviewed the limitations of NIPS, including the fact that it is not diagnostic and that it does not identify all genetic conditions. We discussed that without knowing the precise reason for this abnormal NIPS result, we are limited in understanding if there may be any potentially clinically relevant concerns for the fetus.  Of note, [name] was seen for genetic counseling in her last pregnancy in 2019 for an increased risk for fetal monosomy X (45,X or Turner syndrome) on the MaterniT21 NIPS testing.  The laboratory methodology for MaterniT21 and Brittany Young is different, though the tests are both designed to assess for the same conditions using placental DNA.  In 2019, the patient elected to wait until delivery at which time chromosome analysis was performed. Her daughter, Brittany Young (dob 09/28/18) had a normal female karyotype (46,XX). [name] has had normal fertility and no health or developmental concerns, as well as normal stature.  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     Testing options:  We reviewed available testing options to attempt to get more information to clarify this finding. First, [name] was informed that diagnostic testing via amniocentesis would be the only way to determine if the fetus has a chromosomal abnormality involving the sex chromosomes prenatally. We discussed the technical aspects of the procedure and quoted up to a 1 in 500 (0.2%) risk for spontaneous pregnancy loss or other adverse pregnancy outcomes as a result of amniocentesis. Cultured  cells from an amniocentesis sample allow for the visualization of a fetal karyotype, which can detect >99% of large chromosomal aberrations. Chromosomal microarray can also be performed to identify smaller deletions  or duplications of fetal chromosomal material that fall below the resolution of karyotype. [name] was informed that if a sex chromosome abnormality were identified, we would then be able to research whether the finding is a benign change or one that could have implications for the fetus's postnatal health or development. Because [name] does not desire amniocentesis.  Therefore, she can continue with standard ultrasounds to monitor for fetal anomalies. She may wait to pursue genetic testing postnatally if clinically indicated.  Finally, we offered the option of a maternal karyotype to determine if she herself has a genetic change involving her sex chromosomes. The patient desires this testing, which was ordered today.  We requested additional cell counts to assess for mosaicism on this sample.  Results should be available in 1-2 weeks.  Family history/Pregnancy History: [name] was seen in 2019 and reported no changes in the family history since that time.  There were no reported family members with recurrent pregnancy loss, birth differences, developmental delays, chromosome conditions or known genetic syndrome.  She again declined carrier screening for CF and SMA.  She had prior screening for hemoglobinopathies with hemoglobin solubility, which can assess for S trait but cannot determine other clinically significant hemoglobin variants.  ACOG recommends the use of hemoglobin fractionation for more complete assessment. She has a normal MCV, >80.  This pregnancy is with the same partner as her last child and she reported no complications or exposures to alcohol tobacco or recreational drugs in this pregnancy.  She has been treated for UTIs and is taking a baby aspirin daily.  Plan of care:  [facility] (see report). Follow up as indicated.  Maternal karyotype with extra cell counts drawn today, results should be available in 1-2 weeks and will be called to the  patient.  Follow up karyotype at delivery if maternal karyotype is normal  Offered hemoglobinopathy fractionation because solubility only performed previously.  Pt declined this as well as carrier screening for CF and SMA.  msAFP only screening drawn today for open neural tube defects.  We appreciate being involved in the care of this family and may be reached at [phone].  Cherly Anderson, MS, CGC

## 2020-10-14 LAB — AFP, SERUM, OPEN SPINA BIFIDA
AFP MoM: 0.93
AFP Value: 63.1 ng/mL
Gest. Age on Collection Date: 20.7 weeks
Maternal Age At EDD: 25.2 yr
OSBR Risk 1 IN: 10000
Test Results:: NEGATIVE
Weight: 165 [lb_av]

## 2020-10-16 ENCOUNTER — Telehealth: Payer: Self-pay | Admitting: Obstetrics and Gynecology

## 2020-10-16 NOTE — Telephone Encounter (Signed)
Brittany Young elected to have maternal serum AFP only screening for open neural tube defects.  The results of this screening are within normal limits with a resulting risk for open spina bifida of 1 in 10,000.  This testing detects greater than 80% of open neural tube defects and when combined with second trimester ultrasound the detection is further increased. It is important to remember that not all birth defects can be detected prenatally.  We may be reached at [phone] with any questions or concerns.  Cherly Anderson, MS, CGC

## 2020-10-23 LAB — CHROMOSOME, BLOOD, ROUTINE
Cells Analyzed: 20
Cells Counted: 50
Cells Karyotyped: 2
GTG Band Resolution Achieved: 500

## 2020-10-28 ENCOUNTER — Telehealth: Payer: Self-pay | Admitting: Obstetrics and Gynecology

## 2020-10-28 NOTE — Telephone Encounter (Signed)
I spoke with Ms. Pendry regarding the results of her chromosome analysis which was ordered due to the finding of atypical sex chromosome results on her recent Panorama testing for this pregnancy.  See genetic counseling note from 10/07/20.  Maternal chromosome analysis was offered because this is the second pregnancy with abnormal sex chromosome results on NIPS. In her prior pregnancy, MaterniT21 showed an increased risk for monosomy X.  At the time of delivery, she had a healthy daughter and chromosome analysis on that child showed normal 46,XX.  We suspected the possibility of maternal mosaicism for 45,X given these results in two pregnancies.  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 Five cells (10%) were missing one X chromosome.    In the absence of features Turner syndrome, in adult women, loss of an X chromosome in up to 10% of cells is considered normal. Higher percentages of monosomy X in blood  may also be an incidental finding since 45,X/46,XX mosaicism has been described in women without features of Turner  syndrome (see references).   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 This common inversion is not known to be associated with any clinical effects and is considered a  normal population variant (polymorphism)   The patient was informed of these results, which are likely related to the findings on NIPS in her last two pregnancies. Given her lack of features of Turner syndrome (normal periods, fertility, and stature), this is most likely an incidental finding.  However, we cannot know what the percent of 45,X cells are in different  cell lines within the body.  Most literature indicates that less than 10% mosaicism is very unlikely to be associated with health concerns, though higher levels can be associated with cardiac and endocrine disorders as well as poor pregnancy outcomes.  To be thorough, we offered the option of a referral to the [facility] at [facility] with Dr. Jessica Priest.  Dr. Lanny Cramp is happy to meet with Ms. Herd to review recommendations for evaluation in adult with mosaic Turner syndrome. Of note, the balanced inversion on chromosome 9 is considered a normal variation and is not associated with clinical concerns.  I will fax the records from Ms. Fesperman's visits to [facility] and they will contact her for an appointment.  We are scheduled to see this patient again on 11/11/20.  We may be reached at [phone].  Wilburt Finlay, MS, CGC

## 2020-10-30 ENCOUNTER — Other Ambulatory Visit: Payer: Self-pay

## 2020-10-30 ENCOUNTER — Ambulatory Visit (INDEPENDENT_AMBULATORY_CARE_PROVIDER_SITE_OTHER): Payer: Medicaid Other | Admitting: Obstetrics and Gynecology

## 2020-10-30 ENCOUNTER — Encounter: Payer: Self-pay | Admitting: Obstetrics and Gynecology

## 2020-10-30 VITALS — BP 117/71 | HR 85 | Wt 173.4 lb

## 2020-10-30 DIAGNOSIS — Z3482 Encounter for supervision of other normal pregnancy, second trimester: Secondary | ICD-10-CM | POA: Diagnosis not present

## 2020-10-30 DIAGNOSIS — Z3A24 24 weeks gestation of pregnancy: Secondary | ICD-10-CM

## 2020-10-30 NOTE — Progress Notes (Signed)
ROB: Discussed chromosome testing with patient.  Discussed last maternal-fetal medicine visit.  1 hour GCT next visit.  Patient has occasional back pain.  Discussed physical therapy and stretching exercises.  Patient would like to try the stretching exercises first.

## 2020-11-02 ENCOUNTER — Emergency Department
Admission: EM | Admit: 2020-11-02 | Discharge: 2020-11-02 | Disposition: A | Discharge status: Home / Self Care | Payer: Medicaid Other | Attending: Emergency Medicine | Admitting: Emergency Medicine

## 2020-11-02 ENCOUNTER — Other Ambulatory Visit: Payer: Self-pay

## 2020-11-02 DIAGNOSIS — Z3A24 24 weeks gestation of pregnancy: Secondary | ICD-10-CM | POA: Insufficient documentation

## 2020-11-02 DIAGNOSIS — Z7982 Long term (current) use of aspirin: Secondary | ICD-10-CM | POA: Diagnosis not present

## 2020-11-02 DIAGNOSIS — K047 Periapical abscess without sinus: Secondary | ICD-10-CM | POA: Insufficient documentation

## 2020-11-02 DIAGNOSIS — O99613 Diseases of the digestive system complicating pregnancy, third trimester: Secondary | ICD-10-CM | POA: Diagnosis not present

## 2020-11-02 DIAGNOSIS — Z87891 Personal history of nicotine dependence: Secondary | ICD-10-CM | POA: Diagnosis not present

## 2020-11-02 MED ORDER — AMOXICILLIN 500 MG PO CAPS
500.0000 mg | ORAL_CAPSULE | Freq: Once | ORAL | Status: AC
  Administered 2020-11-02: 500 mg via ORAL
  Filled 2020-11-02: qty 1

## 2020-11-02 MED ORDER — AMOXICILLIN 500 MG PO CAPS: 500.0000 mg | ORAL_CAPSULE | Freq: Three times a day (TID) | ORAL | 0 refills | Status: DC

## 2020-11-02 NOTE — ED Notes (Signed)
Pt alert and oriented X 4, stable for discharge. RR even and unlabored, color WNL. Discussed discharge instructions and follow-up as directed. Discharge medications discussed if prescribed. Pt had opportunity to ask questions, and RN to provide patient/family eduction.  

## 2020-11-02 NOTE — Discharge Instructions (Signed)
Take the antibiotic as directed.  Follow with your dental provider as planned.  Return to the ED if needed.

## 2020-11-02 NOTE — ED Triage Notes (Signed)
Pt states frontal upper dental pain. Pt states has been present since Tuesday. Pt is [redacted] weeks pregnant.

## 2020-11-04 NOTE — ED Provider Notes (Signed)
[facility] Emergency Department Provider Note ____________________________________________  Time seen: 2249  I have reviewed the triage vital signs and the nursing notes.  HISTORY  Chief Complaint  Dental Pain   HPI Brittany Young is a 25 y.o. female at [redacted] weeks gestation, presents to the ED for acute upper gum inflammation and pain.   Patient presents with concern of a possible dental abscess, noting that she is scheduled for a root canal of her upper right secondary incisor.  She is noted some fullness to the area of the frenulum of the upper lip but denies any spontaneous purulent drainage.  She also notes some pain with chewing.  She denies any interim fever, chills, sweats patient has no complaints related to the pregnancy.  Past Medical History:  Diagnosis Date  . History of cesarean section 11/03/2016  . Hypertension    With past pregnancy  . Ovarian cyst     Patient Active Problem List   Diagnosis Date Noted  . Fibrocystic breast changes, right 12/06/2019  . History of pregnancy induced hypertension 11/03/2016    Past Surgical History:  Procedure Laterality Date  . APPENDECTOMY    . CESAREAN SECTION N/A 09/2011  . CESAREAN SECTION N/A 09/27/2018   Procedure: CESAREAN SECTION repeat;  Surgeon: Hildred Laser, MD;  Location: [facility] ORS;  Service: Obstetrics;  Laterality: N/A;  . LAPAROSCOPIC APPENDECTOMY N/A 07/23/2015   Procedure: APPENDECTOMY LAPAROSCOPIC;  Surgeon: Lattie Haw, MD;  Location: [facility] ORS;  Service: General;  Laterality: N/A;  . WISDOM TOOTH EXTRACTION      Prior to Admission medications   Medication Sig Start Date End Date Taking? Authorizing Provider  amoxicillin (AMOXIL) 500 MG capsule Take 1 capsule (500 mg total) by mouth 3 (three) times daily. 11/02/20  Yes Holman Bonsignore, Charlesetta Ivory, PA-C  aspirin EC 81 MG tablet Take 1 tablet (81 mg total) by mouth daily. Take after 12 weeks for prevention of preeclampssia later in  pregnancy 08/07/20   Hildred Laser, MD  metoCLOPramide (REGLAN) 10 MG tablet Take 1 tablet (10 mg total) by mouth every 6 (six) hours as needed. 07/27/20   Sharman Cheek, MD  Prenatal Vit-Fe Fumarate-FA (PRENATAL VITAMIN) 27-0.8 MG TABS Take by mouth.    [provider]    Allergies Patient has no known allergies.  Family History  Problem Relation Age of Onset  . GER disease Mother   . Hypertension Mother   . Cancer Maternal Grandfather   . Diabetes Maternal Grandfather   . Hypertension Maternal Grandfather   . Breast cancer Neg Hx   . Ovarian cancer Neg Hx   . Colon cancer Neg Hx     Social History Social History   Tobacco Use  . Smoking status: Former Smoker    Packs/day: 0.25    Types: Cigars  . Smokeless tobacco: Never Used  . Tobacco comment: smoke black and milds  Vaping Use  . Vaping Use: Never used  Substance Use Topics  . Alcohol use: No    Alcohol/week: 0.0 standard drinks  . Drug use: Not Currently    Types: Marijuana    Comment: last used- 02/06/2018    Review of Systems  Constitutional: Negative for fever. Eyes: Negative for visual changes. ENT: Negative for sore throat.  Dental pain as above. Cardiovascular: Negative for chest pain. Respiratory: Negative for shortness of breath. Gastrointestinal: Negative for abdominal pain, vomiting and diarrhea. Genitourinary: Negative for dysuria. Musculoskeletal: Negative for back pain. Skin: Negative for rash. Neurological:  Negative for headaches, focal weakness or numbness. ____________________________________________  PHYSICAL EXAM:  VITAL SIGNS: ED Triage Vitals  Enc Vitals Group     BP 11/02/20 2134 123/80     Pulse Rate 11/02/20 2134 90     Resp 11/02/20 2134 16     Temp 11/02/20 2134 98.7 F (37.1 C)     Temp src --      SpO2 11/02/20 2134 98 %     Weight 11/02/20 2120 173 lb (78.5 kg)     Height 11/02/20 2120 5\' 3"  (1.6 m)     Head Circumference --      Peak Flow --      Pain Score  11/02/20 2120 10     Pain Loc --      Pain Edu? --      Excl. in GC? --     Constitutional: Alert and oriented. Well appearing and in no distress. Head: Normocephalic and atraumatic. Eyes: Conjunctivae are normal. PERRL. Normal extraocular movements Ears: Canals clear. TMs intact bilaterally. Nose: No congestion/rhinorrhea/epistaxis. Mouth/Throat: Mucous membranes are moist.  Uvula is midline and tonsils are flat.  No focal gum swelling appreciated to the upper primary incisors.  Patient is tender however to palpation at the base of the upper right primary and secondary incisors.  Some subtle prominence is noted to the area near the philtrum, greater right than left. Neck: Supple. No thyromegaly. Hematological/Lymphatic/Immunological: No cervical lymphadenopathy. Cardiovascular: Normal rate, regular rhythm. Normal distal pulses. Respiratory: Normal respiratory effort. No wheezes/rales/rhonchi. Gastrointestinal: Gravid, soft and nontender. No distention. Musculoskeletal: Nontender with normal range of motion in all extremities.  Neurologic:  Normal gait without ataxia. Normal speech and language. No gross focal neurologic deficits are appreciated. Skin:  Skin is warm, dry and intact. No rash noted. Psychiatric: Mood and affect are normal. Patient exhibits appropriate insight and judgment. ____________________________________________  PROCEDURES  Amoxicillin 500 mg p.o.  Procedures ____________________________________________   INITIAL IMPRESSION / ASSESSMENT AND PLAN / ED COURSE  As part of my medical decision making, I reviewed the following data within the electronic MEDICAL RECORD NUMBER Notes from prior ED visits     Female patient at [redacted] weeks gestation, presents for dental pain and probable dental abscess.  Patient with a history of dental injury resulting in a requirement for root canal, presents with acute upper dental pain at the primary and secondary incisors.  She is treated  empirically for dental infection with amoxicillin.  She will follow-up with a dental provider as planned.  Return precautions have been discussed.  Brittany Young was evaluated in Emergency Department on 11/04/2020 for the symptoms described in the history of present illness. She was evaluated in the context of the global COVID-19 pandemic, which necessitated consideration that the patient might be at risk for infection with the SARS-CoV-2 virus that causes COVID-19. Institutional protocols and algorithms that pertain to the evaluation of patients at risk for COVID-19 are in a state of rapid change based on information released by regulatory bodies including the CDC and federal and state organizations. These policies and algorithms were followed during the patient's care in the ED. ____________________________________________  FINAL CLINICAL IMPRESSION(S) / ED DIAGNOSES  Final diagnoses:  Dental abscess      01/04/2021, PA-C 11/04/20 0054    01/04/21, MD 11/09/20 [id number]

## 2020-11-10 ENCOUNTER — Other Ambulatory Visit: Payer: Self-pay | Admitting: Obstetrics and Gynecology

## 2020-11-10 DIAGNOSIS — O10912 Unspecified pre-existing hypertension complicating pregnancy, second trimester: Secondary | ICD-10-CM

## 2020-11-10 DIAGNOSIS — O280 Abnormal hematological finding on antenatal screening of mother: Secondary | ICD-10-CM

## 2020-11-10 DIAGNOSIS — Z8759 Personal history of other complications of pregnancy, childbirth and the puerperium: Secondary | ICD-10-CM

## 2020-11-11 ENCOUNTER — Other Ambulatory Visit: Payer: Self-pay

## 2020-11-11 ENCOUNTER — Ambulatory Visit: Payer: Medicaid Other | Attending: Maternal & Fetal Medicine

## 2020-11-11 DIAGNOSIS — Z8759 Personal history of other complications of pregnancy, childbirth and the puerperium: Secondary | ICD-10-CM

## 2020-11-11 DIAGNOSIS — O280 Abnormal hematological finding on antenatal screening of mother: Secondary | ICD-10-CM | POA: Diagnosis not present

## 2020-11-11 DIAGNOSIS — Z3A25 25 weeks gestation of pregnancy: Secondary | ICD-10-CM

## 2020-11-11 DIAGNOSIS — O10912 Unspecified pre-existing hypertension complicating pregnancy, second trimester: Secondary | ICD-10-CM | POA: Diagnosis not present

## 2020-11-25 NOTE — Patient Instructions (Incomplete)
Common Medications Safe in Pregnancy  Acne:      Constipation:  Benzoyl Peroxide     Colace  Clindamycin      Dulcolax Suppository  Topica Erythromycin     Fibercon  Salicylic Acid      Metamucil         Miralax AVOID:        Senakot   Accutane    Cough:  Retin-A       Cough Drops  Tetracycline      Phenergan w/ Codeine if Rx  Minocycline      Robitussin (Plain & DM)  Antibiotics:     Crabs/Lice:  Ceclor       RID  Cephalosporins    AVOID:  E-Mycins      Kwell  Keflex  Macrobid/Macrodantin   Diarrhea:  Penicillin      Kao-Pectate  Zithromax      Imodium AD         PUSH FLUIDS AVOID:       Cipro     Fever:  Tetracycline      Tylenol (Regular or Extra  Minocycline       Strength)  Levaquin      Extra Strength-Do not          Exceed 8 tabs/24 hrs Caffeine:        <200mg/day (equiv. To 1 cup of coffee or  approx. 3 12 oz sodas)         Gas: Cold/Hayfever:       Gas-X  Benadryl      Mylicon  Claritin       Phazyme  **Claritin-D        Chlor-Trimeton    Headaches:  Dimetapp      ASA-Free Excedrin  Drixoral-Non-Drowsy     Cold Compress  Mucinex (Guaifenasin)     Tylenol (Regular or Extra  Sudafed/Sudafed-12 Hour     Strength)  **Sudafed PE Pseudoephedrine   Tylenol Cold & Sinus     Vicks Vapor Rub  Zyrtec  **AVOID if Problems With Blood Pressure         Heartburn: Avoid lying down for at least 1 hour after meals  Aciphex      Maalox     Rash:  Milk of Magnesia     Benadryl    Mylanta       1% Hydrocortisone Cream  Pepcid  Pepcid Complete   Sleep Aids:  Prevacid      Ambien   Prilosec       Benadryl  Rolaids       Chamomile Tea  Tums (Limit 4/day)     Unisom         Tylenol PM         Warm milk-add vanilla or  Hemorrhoids:       Sugar for taste  Anusol/Anusol H.C.  (RX: Analapram 2.5%)  Sugar Substitutes:  Hydrocortisone OTC     Ok in moderation  Preparation H      Tucks        Vaseline lotion applied to tissue with  wiping    Herpes:     Throat:  Acyclovir      Oragel  Famvir  Valtrex     Vaccines:         Flu Shot Leg Cramps:       *Gardasil  Benadryl      Hepatitis A         Hepatitis B Nasal Spray:         Pneumovax  Saline Nasal Spray     Polio Booster         Tetanus Nausea:       Tuberculosis test or PPD  Vitamin B6 25 mg TID   AVOID:    Dramamine      *Gardasil  Emetrol       Live Poliovirus  Ginger Root 250 mg QID    MMR (measles, mumps &  High Complex Carbs @ Bedtime    rebella)  Sea Bands-Accupressure    Varicella (Chickenpox)  Unisom 1/2 tab TID     *No known complications           If received before Pain:         Known pregnancy;   Darvocet       Resume series after  Lortab        Delivery  Percocet    Yeast:   Tramadol      Femstat  Tylenol 3      Gyne-lotrimin  Ultram       Monistat  Vicodin           MISC:         All Sunscreens           Hair Coloring/highlights          Insect Repellant's          (Including DEET)         Mystic Tans Third Trimester of Pregnancy  The third trimester of pregnancy is from week 28 through week 40. This is also called months 7 through 9. This trimester is when your unborn baby (fetus) is growing very fast. At the end of the ninth month, the unborn baby is about 20 inches long. It weighs about 6-10 pounds. Body changes during your third trimester Your body continues to go through many changes during this time. The changes vary and generally return to normal after the baby is born. Physical changes  Your weight will continue to increase. You may gain 25-35 pounds (11-16 kg) by the end of the pregnancy. If you are underweight, you may gain 28-40 lb (about 13-18 kg). If you are overweight, you may gain 15-25 lb (about 7-11 kg).  You may start to get stretch marks on your hips, belly (abdomen), and breasts.  Your breasts will continue to grow and may hurt. A yellow fluid (colostrum) may leak from your breasts. This is the first milk you are  making for your baby.  You may have changes in your hair.  Your belly button may stick out.  You may have more swelling in your hands, face, or ankles. Health changes  You may have heartburn.  You may have trouble pooping (constipation).  You may get hemorrhoids. These are swollen veins in the butt that can itch or get painful.  You may have swollen veins (varicose veins) in your legs.  You may have more body aches in the pelvis, back, or thighs.  You may have more tingling or numbness in your hands, arms, and legs. The skin on your belly may also feel numb.  You may feel short of breath as your womb (uterus) gets bigger. Other changes  You may pee (urinate) more often.  You may have more problems sleeping.  You may notice the unborn baby "dropping," or moving lower in your belly.  You may have more discharge coming from your vagina.  Your joints may feel loose, and you may have pain around your  pelvic bone. Follow these instructions at home: Medicines  Take over-the-counter and prescription medicines only as told by your doctor. Some medicines are not safe during pregnancy.  Take a prenatal vitamin that contains at least 600 micrograms (mcg) of folic acid. Eating and drinking  Eat healthy meals that include: ? Fresh fruits and vegetables. ? Whole grains. ? Good sources of protein, such as meat, eggs, or tofu. ? Low-fat dairy products.  Avoid raw meat and unpasteurized juice, milk, and cheese. These carry germs that can harm you and your baby.  Eat 4 or 5 small meals rather than 3 large meals a day.  You may need to take these actions to prevent or treat trouble pooping: ? Drink enough fluids to keep your pee (urine) pale yellow. ? Eat foods that are high in fiber. These include beans, whole grains, and fresh fruits and vegetables. ? Limit foods that are high in fat and sugar. These include fried or sweet foods. Activity  Exercise only as told by your doctor.  Stop exercising if you start to have cramps in your womb.  Avoid heavy lifting.  Do not exercise if it is too hot or too humid, or if you are in a place of great height (high altitude).  If you choose to, you may have sex unless your doctor tells you not to. Relieving pain and discomfort  Take breaks often, and rest with your legs raised (elevated) if you have leg cramps or low back pain.  Take warm water baths (sitz baths) to soothe pain or discomfort caused by hemorrhoids. Use hemorrhoid cream if your doctor approves.  Wear a good support bra if your breasts are tender.  If you develop bulging, swollen veins in your legs: ? Wear support hose as told by your doctor. ? Raise your feet for 15 minutes, 3-4 times a day. ? Limit salt in your food. Safety  Talk to your doctor before traveling far distances.  Do not use hot tubs, steam rooms, or saunas.  Wear your seat belt at all times when you are in a car.  Talk with your doctor if someone is hurting you or yelling at you a lot. Preparing for your baby's arrival To prepare for the arrival of your baby:  Take prenatal classes.  Visit the hospital and tour the maternity area.  Buy a rear-facing car seat. Learn how to install it in your car.  Prepare the baby's room. Take out all pillows and stuffed animals from the baby's crib. General instructions  Avoid cat litter boxes and soil used by cats. These carry germs that can cause harm to the baby and can cause a loss of your baby by miscarriage or stillbirth.  Do not douche or use tampons. Do not use scented sanitary pads.  Do not smoke or use any products that contain nicotine or tobacco. If you need help quitting, ask your doctor.  Do not drink alcohol.  Do not use herbal medicines, illegal drugs, or medicines that were not approved by your doctor. Chemicals in these products can affect your baby.  Keep all follow-up visits. This is important. Where to find more  information  American Pregnancy Association: americanpregnancy.org  [employer]: www.acog.org  Office on Women's Health: KeywordPortfolios.com.br Contact a doctor if:  You have a fever.  You have mild cramps or pressure in your lower belly.  You have a nagging pain in your belly area.  You vomit, or you have watery poop (  diarrhea).  You have bad-smelling fluid coming from your vagina.  You have pain when you pee, or your pee smells bad.  You have a headache that does not go away when you take medicine.  You have changes in how you see, or you see spots in front of your eyes. Get help right away if:  Your water breaks.  You have regular contractions that are less than 5 minutes apart.  You are spotting or bleeding from your vagina.  You have very bad belly cramps or pain.  You have trouble breathing.  You have chest pain.  You faint.  You have not felt the baby move for the amount of time told by your doctor.  You have new or increased pain, swelling, or redness in an arm or leg. Summary  The third trimester is from week 28 through week 40 (months 7 through 9). This is the time when your unborn baby is growing very fast.  During this time, your discomfort may increase as you gain weight and as your baby grows.  Get ready for your baby to arrive by taking prenatal classes, buying a rear-facing car seat, and preparing the baby's room.  Get help right away if you are bleeding from your vagina, you have chest pain and trouble breathing, or you have not felt the baby move for the amount of time told by your doctor. This information is not intended to replace advice given to you by your health care provider. Make sure you discuss any questions you have with your health care provider. Document Revised: 11/28/2019 Document Reviewed: 10/04/2019 Elsevier Patient Education  [place]. WHAT OB PATIENTS CAN  EXPECT   Confirmation of pregnancy and ultrasound ordered if medically indicated-[redacted] weeks gestation  New OB (NOB) intake with nurse and New OB (NOB) labs- [redacted] weeks gestation  New OB (NOB) physical examination with provider- 11/[redacted] weeks gestation  Flu vaccine-[redacted] weeks gestation  Anatomy scan-[redacted] weeks gestation  Glucose tolerance test, blood work to test for anemia, T-dap vaccine-[redacted] weeks gestation  Vaginal swabs/cultures-STD/Group B strep-[redacted] weeks gestation  Appointments every 4 weeks until 28 weeks  Every 2 weeks from 28 weeks until 36 weeks  Weekly visits from 36 weeks until delivery  Tdap (Tetanus, Diphtheria, Pertussis) Vaccine: What You Need to Know 1. Why get vaccinated? Tdap vaccine can prevent tetanus, diphtheria, and pertussis. Diphtheria and pertussis spread from person to person. Tetanus enters the body through cuts or wounds.  TETANUS (T) causes painful stiffening of the muscles. Tetanus can lead to serious health problems, including being unable to open the mouth, having trouble swallowing and breathing, or death.  DIPHTHERIA (D) can lead to difficulty breathing, heart failure, paralysis, or death.  PERTUSSIS (aP), also known as "whooping cough," can cause uncontrollable, violent coughing that makes it hard to breathe, eat, or drink. Pertussis can be extremely serious especially in babies and young children, causing pneumonia, convulsions, brain damage, or death. In teens and adults, it can cause weight loss, loss of bladder control, passing out, and rib fractures from severe coughing. 2. Tdap vaccine Tdap is only for children 7 years and older, adolescents, and adults.  Adolescents should receive a single dose of Tdap, preferably at age 58 or 2 years. Pregnant people should get a dose of Tdap during every pregnancy, preferably during the early part of the third trimester, to help protect the newborn from pertussis. Infants are most at risk for severe, life-threatening  complications from pertussis. Adults who have  never received Tdap should get a dose of Tdap. Also, adults should receive a booster dose of either Tdap or Td (a different vaccine that protects against tetanus and diphtheria but not pertussis) every 10 years, or after 5 years in the case of a severe or dirty wound or burn. Tdap may be given at the same time as other vaccines. 3. Talk with your health care provider Tell your vaccine provider if the person getting the vaccine:  Has had an allergic reaction after a previous dose of any vaccine that protects against tetanus, diphtheria, or pertussis, or has any severe, life-threatening allergies  Has had a coma, decreased level of consciousness, or prolonged seizures within 7 days after a previous dose of any pertussis vaccine (DTP, DTaP, or Tdap)  Has seizures or another nervous system problem  Has ever had Guillain-Barr Syndrome (also called "GBS")  Has had severe pain or swelling after a previous dose of any vaccine that protects against tetanus or diphtheria In some cases, your health care provider may decide to postpone Tdap vaccination until a future visit. People with minor illnesses, such as a cold, may be vaccinated. People who are moderately or severely ill should usually wait until they recover before getting Tdap vaccine.  Your health care provider can give you more information. 4. Risks of a vaccine reaction  Pain, redness, or swelling where the shot was given, mild fever, headache, feeling tired, and nausea, vomiting, diarrhea, or stomachache sometimes happen after Tdap vaccination. People sometimes faint after medical procedures, including vaccination. Tell your provider if you feel dizzy or have vision changes or ringing in the ears.  As with any medicine, there is a very remote chance of a vaccine causing a severe allergic reaction, other serious injury, or death. 5. What if there is a serious problem? An allergic reaction could  occur after the vaccinated person leaves the clinic. If you see signs of a severe allergic reaction (hives, swelling of the face and throat, difficulty breathing, a fast heartbeat, dizziness, or weakness), call 9-1-1 and get the person to the nearest hospital. For other signs that concern you, call your health care provider.  Adverse reactions should be reported to the Vaccine Adverse Event Reporting System (VAERS). Your health care provider will usually file this report, or you can do it yourself. Visit the VAERS website at www.vaers.SamedayNews.es or call (858) 279-5209. VAERS is only for reporting reactions, and VAERS staff members do not give medical advice. 6. The National Vaccine Injury Compensation Program The [employer] Vaccine Injury Compensation Program (VICP) is a federal program that was created to compensate people who may have been injured by certain vaccines. Claims regarding alleged injury or death due to vaccination have a time limit for filing, which may be as short as two years. Visit the VICP website at GoldCloset.com.ee or call 815 059 1955 to learn about the program and about filing a claim. 7. How can I learn more?  Ask your health care provider.  Call your local or state health department.  Visit the website of the Food and Drug Administration (FDA) for vaccine package inserts and additional information at TraderRating.uy.  Contact the Centers for Disease Control and Prevention (CDC): ? Call 6304535210 (1-800-CDC-INFO) or ? Visit CDC's website at http://hunter.com/. Vaccine Information Statement Tdap (Tetanus, Diphtheria, Pertussis) Vaccine (02/08/2020) This information is not intended to replace advice given to you by your health care provider. Make sure you discuss any questions you have with your health care provider. Document Revised: 03/05/2020 Document  Reviewed: 03/05/2020 Elsevier Patient Education  [place].

## 2020-11-25 NOTE — Progress Notes (Deleted)
OB-PT present for 28 weeks labs, Tdap vaccine and to sign BTC form.

## 2020-11-26 ENCOUNTER — Other Ambulatory Visit: Payer: Medicaid Other

## 2020-11-26 ENCOUNTER — Encounter: Payer: Medicaid Other | Admitting: Obstetrics and Gynecology

## 2020-11-26 DIAGNOSIS — Z3482 Encounter for supervision of other normal pregnancy, second trimester: Secondary | ICD-10-CM

## 2020-11-26 DIAGNOSIS — Z3A27 27 weeks gestation of pregnancy: Secondary | ICD-10-CM

## 2020-11-27 ENCOUNTER — Encounter: Payer: Self-pay | Admitting: Obstetrics and Gynecology

## 2020-12-24 ENCOUNTER — Other Ambulatory Visit: Payer: Self-pay | Admitting: Obstetrics and Gynecology

## 2020-12-24 DIAGNOSIS — Z8759 Personal history of other complications of pregnancy, childbirth and the puerperium: Secondary | ICD-10-CM

## 2020-12-24 DIAGNOSIS — O133 Gestational [pregnancy-induced] hypertension without significant proteinuria, third trimester: Secondary | ICD-10-CM

## 2020-12-30 ENCOUNTER — Ambulatory Visit: Payer: Medicaid Other

## 2020-12-30 NOTE — Progress Notes (Deleted)
Pt was a "No Show" for her appt at [facility] @ [facility] today.

## 2021-01-25 ENCOUNTER — Observation Stay
Admission: EM | Admit: 2021-01-25 | Discharge: 2021-01-26 | Disposition: A | Discharge status: Home / Self Care | Payer: Medicaid Other | Attending: Obstetrics and Gynecology | Admitting: Obstetrics and Gynecology

## 2021-01-25 DIAGNOSIS — O26893 Other specified pregnancy related conditions, third trimester: Secondary | ICD-10-CM | POA: Diagnosis present

## 2021-01-25 DIAGNOSIS — N898 Other specified noninflammatory disorders of vagina: Secondary | ICD-10-CM | POA: Diagnosis not present

## 2021-01-25 DIAGNOSIS — N760 Acute vaginitis: Secondary | ICD-10-CM

## 2021-01-25 DIAGNOSIS — Z3A36 36 weeks gestation of pregnancy: Secondary | ICD-10-CM | POA: Insufficient documentation

## 2021-01-25 DIAGNOSIS — Z3490 Encounter for supervision of normal pregnancy, unspecified, unspecified trimester: Secondary | ICD-10-CM

## 2021-01-25 DIAGNOSIS — B9689 Other specified bacterial agents as the cause of diseases classified elsewhere: Secondary | ICD-10-CM

## 2021-01-25 DIAGNOSIS — N949 Unspecified condition associated with female genital organs and menstrual cycle: Secondary | ICD-10-CM

## 2021-01-26 ENCOUNTER — Other Ambulatory Visit: Payer: Self-pay

## 2021-01-26 ENCOUNTER — Encounter: Payer: Self-pay | Admitting: Obstetrics and Gynecology

## 2021-01-26 ENCOUNTER — Telehealth: Payer: Self-pay | Admitting: Obstetrics and Gynecology

## 2021-01-26 DIAGNOSIS — N760 Acute vaginitis: Secondary | ICD-10-CM

## 2021-01-26 DIAGNOSIS — O26893 Other specified pregnancy related conditions, third trimester: Secondary | ICD-10-CM | POA: Diagnosis not present

## 2021-01-26 DIAGNOSIS — Z3A36 36 weeks gestation of pregnancy: Secondary | ICD-10-CM

## 2021-01-26 DIAGNOSIS — N949 Unspecified condition associated with female genital organs and menstrual cycle: Secondary | ICD-10-CM | POA: Diagnosis not present

## 2021-01-26 DIAGNOSIS — B9689 Other specified bacterial agents as the cause of diseases classified elsewhere: Secondary | ICD-10-CM

## 2021-01-26 DIAGNOSIS — Z3490 Encounter for supervision of normal pregnancy, unspecified, unspecified trimester: Secondary | ICD-10-CM

## 2021-01-26 LAB — WET PREP, GENITAL
Sperm: NONE SEEN
Trich, Wet Prep: NONE SEEN
Yeast Wet Prep HPF POC: NONE SEEN

## 2021-01-26 LAB — URINALYSIS, ROUTINE W REFLEX MICROSCOPIC
Bilirubin Urine: NEGATIVE
Glucose, UA: NEGATIVE mg/dL
Ketones, ur: NEGATIVE mg/dL
Nitrite: NEGATIVE
Protein, ur: 30 mg/dL — AB
Specific Gravity, Urine: 1.028 (ref 1.005–1.030)
pH: 6 (ref 5.0–8.0)

## 2021-01-26 NOTE — Telephone Encounter (Signed)
Pt called stating that she was seen in ER by [facility] and that her RX did not get sent. Confirmed her pharmacy as [facility] on [address] - pt unsure of medication name.

## 2021-01-26 NOTE — Discharge Instructions (Signed)

## 2021-01-26 NOTE — OB Triage Note (Signed)
Pt discharged home in stable condition. RN reviewed discharge instructions to patient, including labor precautions and prescription information. Pt verbalized understanding and all questions answered at this time.

## 2021-01-26 NOTE — OB Triage Note (Signed)
Pt is a [redacted]w[redacted]d, [id number]. Pt is currently being seen at [facility], due to hx of Turner Syndrome. Pt is arrived to [facility] with complaints of constant vaginal burning. Pt states that it feels swollen and hurts when walking. Pt states that when she just went to the restroom and wiped, she noticed light pink spotting. Pt rates burning pain 8/10. Pt denies ctx and LOF. Pt states positive FM. Monitors applied and assessing. Initial FHT 145.

## 2021-01-27 DIAGNOSIS — N760 Acute vaginitis: Secondary | ICD-10-CM

## 2021-01-27 DIAGNOSIS — N949 Unspecified condition associated with female genital organs and menstrual cycle: Secondary | ICD-10-CM

## 2021-01-27 DIAGNOSIS — B9689 Other specified bacterial agents as the cause of diseases classified elsewhere: Secondary | ICD-10-CM

## 2021-01-27 MED ORDER — METRONIDAZOLE 0.75 % VA GEL: 1.0000 | Freq: Every day | VAGINAL | 1 refills | Status: DC

## 2021-01-27 NOTE — Final Progress Note (Signed)
L&D OB Triage Note  Brittany Young is a 25 y.o. [id number] female at [redacted]w[redacted]d, EDD Estimated Date of Delivery: 02/19/21 who presented to triage for complaints of vaginal burning for several days. She currently receives care at [facility]. Notes vagina feels swollen and hurts to walk.  Noticed some light pink spotting when wiping x 1 episode.  Of note, reports she was treated for a BV infection not to long ago. She was evaluated by the nurses. Vital signs stable. An NST was performed and has been reviewed by MD.    NST INTERPRETATION: Indications: patient reassurance  Mode: External Baseline Rate (A): 135 bpm (fht) Variability: Moderate Accelerations: 15 x 15 Decelerations: None     Contraction Frequency (min): none noted  Impression: reactive   Labs:  Results for orders placed or performed during the hospital encounter of 01/25/21  Wet prep, genital  Result Value Ref Range   Yeast Wet Prep HPF POC NONE SEEN NONE SEEN   Trich, Wet Prep NONE SEEN NONE SEEN   Clue Cells Wet Prep HPF POC PRESENT (A) NONE SEEN   WBC, Wet Prep HPF POC MANY (A) NONE SEEN   Sperm NONE SEEN   Urinalysis, Routine w reflex microscopic  Result Value Ref Range   Color, Urine YELLOW (A) YELLOW   APPearance HAZY (A) CLEAR   Specific Gravity, Urine 1.028 1.005 - 1.030   pH 6.0 5.0 - 8.0   Glucose, UA NEGATIVE NEGATIVE mg/dL   Hgb urine dipstick SMALL (A) NEGATIVE   Bilirubin Urine NEGATIVE NEGATIVE   Ketones, ur NEGATIVE NEGATIVE mg/dL   Protein, ur 30 (A) NEGATIVE mg/dL   Nitrite NEGATIVE NEGATIVE   Leukocytes,Ua LARGE (A) NEGATIVE   RBC / HPF 21-50 0 - 5 RBC/hpf   WBC, UA 11-20 0 - 5 WBC/hpf   Bacteria, UA RARE (A) NONE SEEN   Squamous Epithelial / LPF 6-10 0 - 5   Mucus PRESENT      Plan: NST performed was reviewed and was found to be reactive. Based on labs, appears that she has BV infection (either recurrent, or inadequately treated prior infection). Will prescribe Metrogel (received Flagyl last time).   Continue routine prenatal care. Follow up with OB/GYN as previously scheduled.     Hildred Laser, MD [facility]

## 2021-01-29 NOTE — Telephone Encounter (Signed)
Spoke to pt and she picked up the medication Metrogel yesterday.

## 2021-05-29 IMAGING — US US OB < 14 WEEKS - US OB TV
1 series · 13 of 28 positions shown · non-contrast
Comparison: None this pregnancy.

CLINICAL DATA: Pregnant patient in first-trimester pregnancy with
vaginal bleeding for 1 day.

EXAM:
OBSTETRIC <14 WK US AND TRANSVAGINAL OB US
TECHNIQUE: Both transabdominal and transvaginal ultrasound examinations were
performed for complete evaluation of the gestation as well as the
maternal uterus, adnexal regions, and pelvic cul-de-sac.
Transvaginal technique was performed to assess early pregnancy.

[Series 1: us ob comp less 14 wks · 13 of 111 slices shown]
[im 5/111]
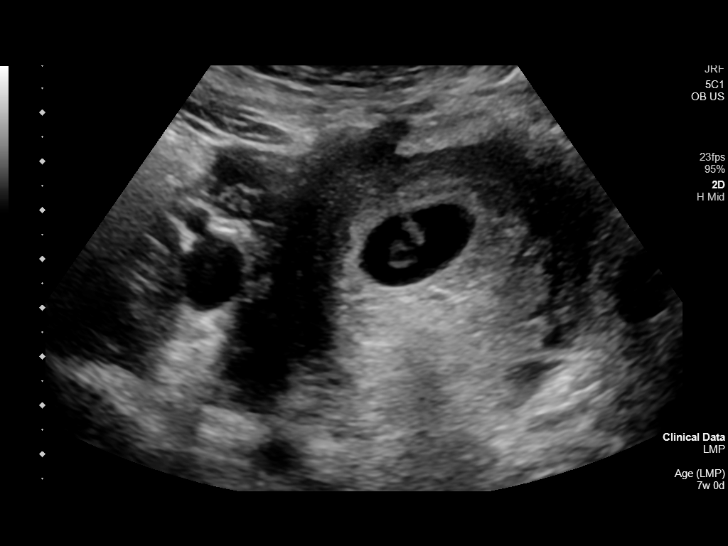
[im 13/111]
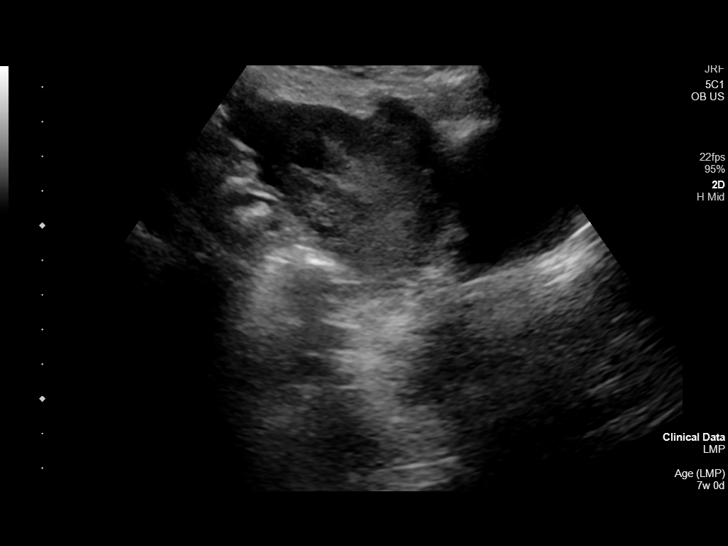
[im 21/111]
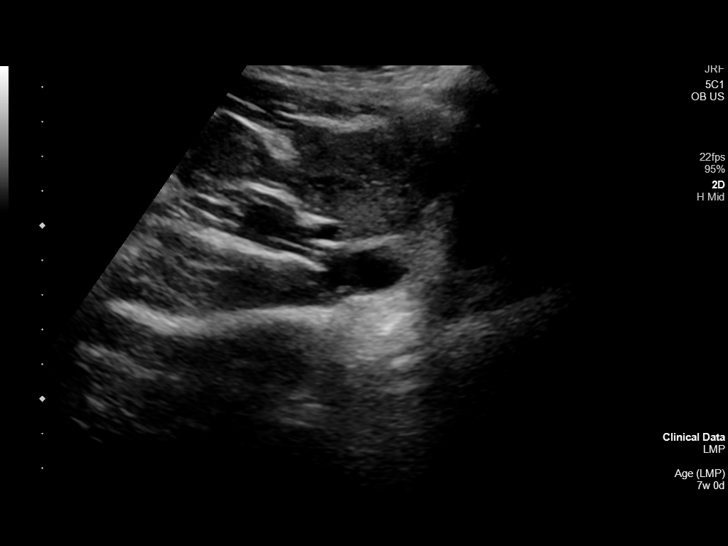
[im 29/111]
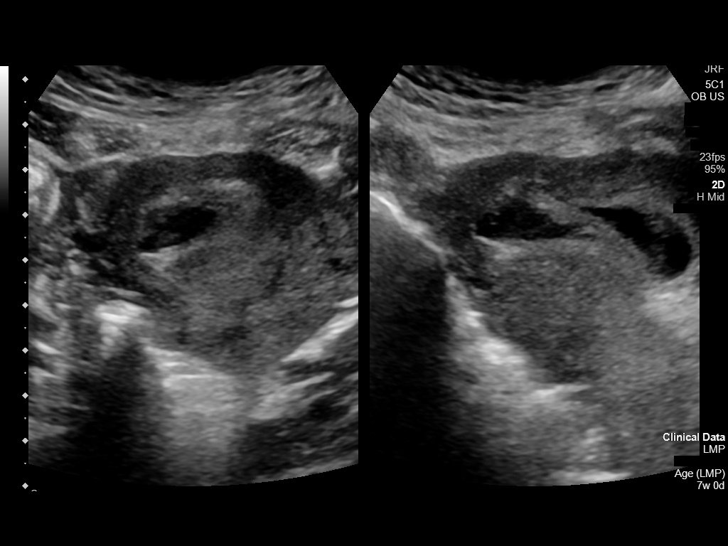
[im 37/111]
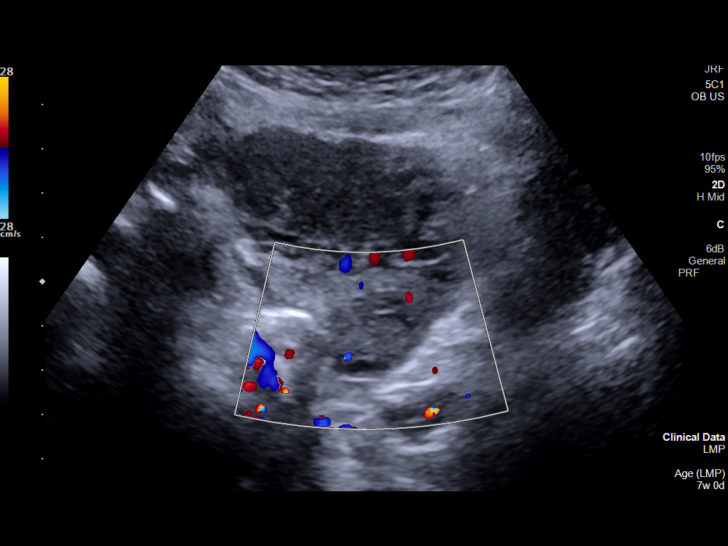
[im 45/111]
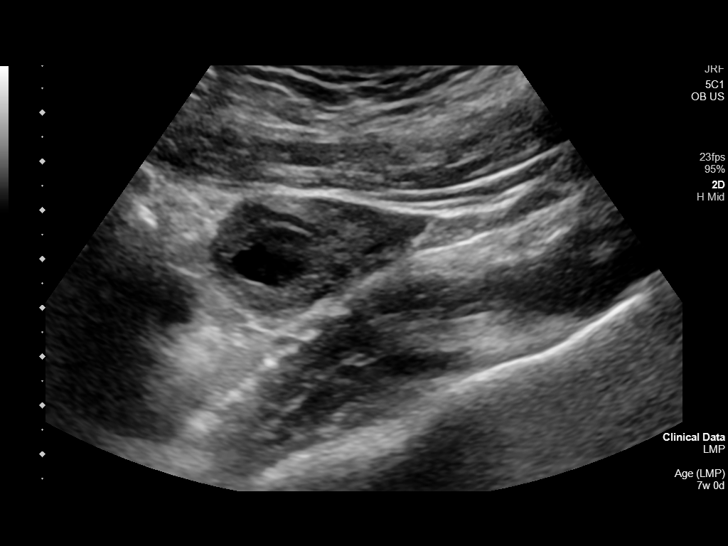
[im 58/111]
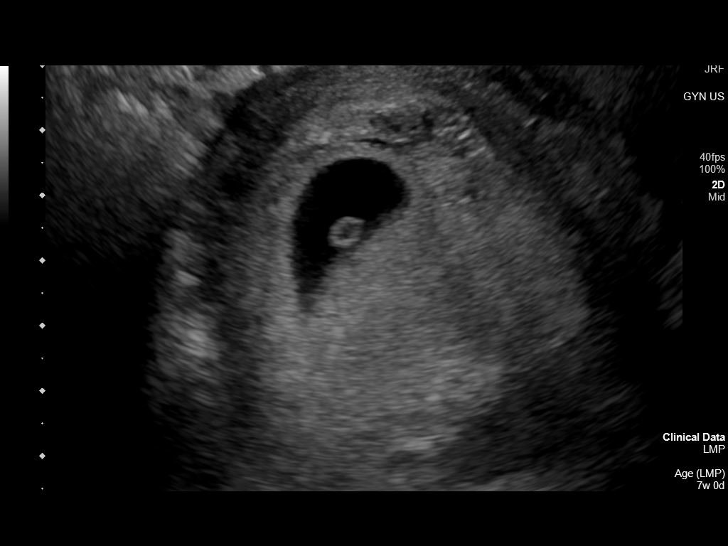
[im 66/111]
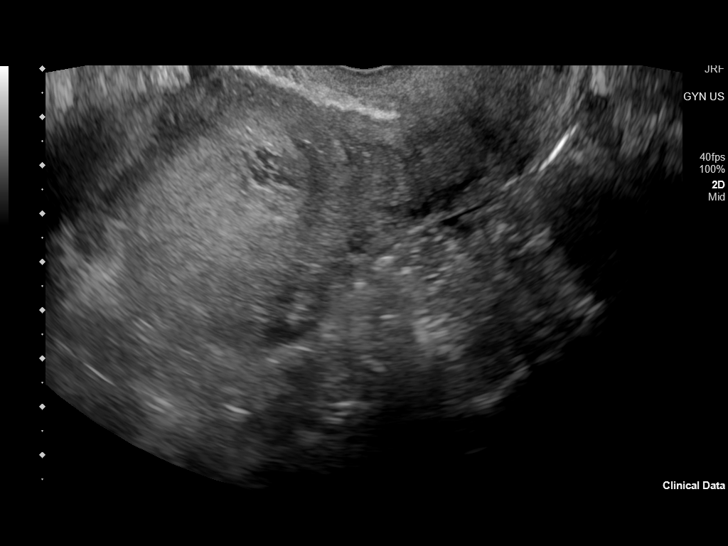
[im 74/111]
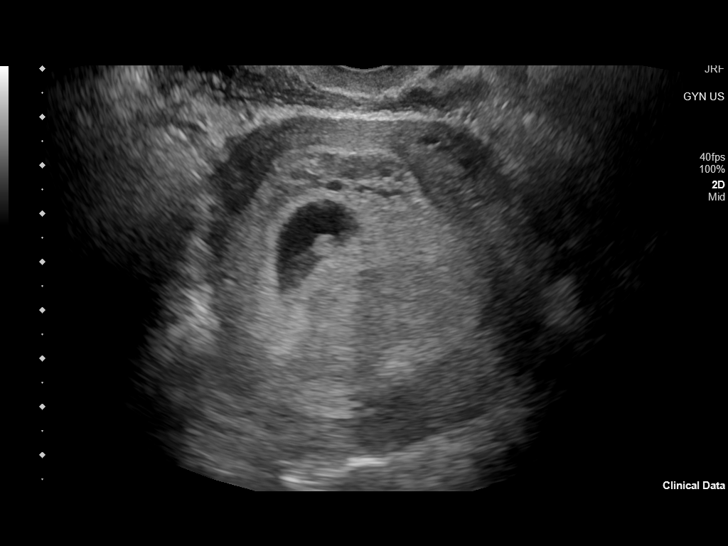
[im 82/111]
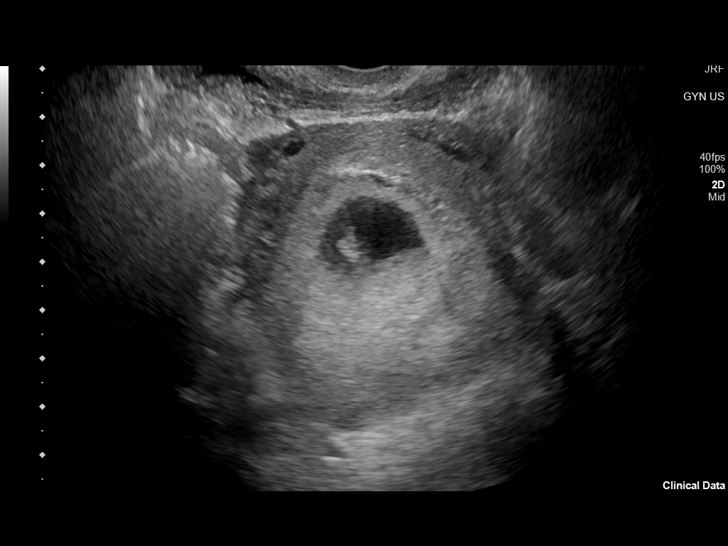
[im 90/111]
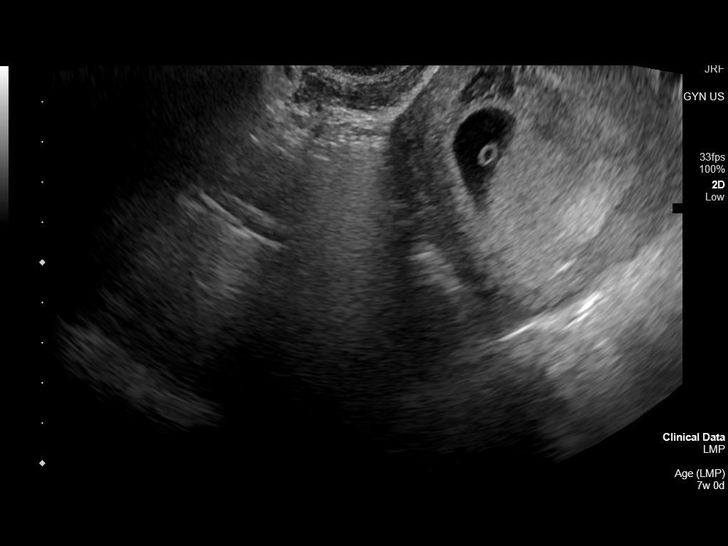
[im 98/111]
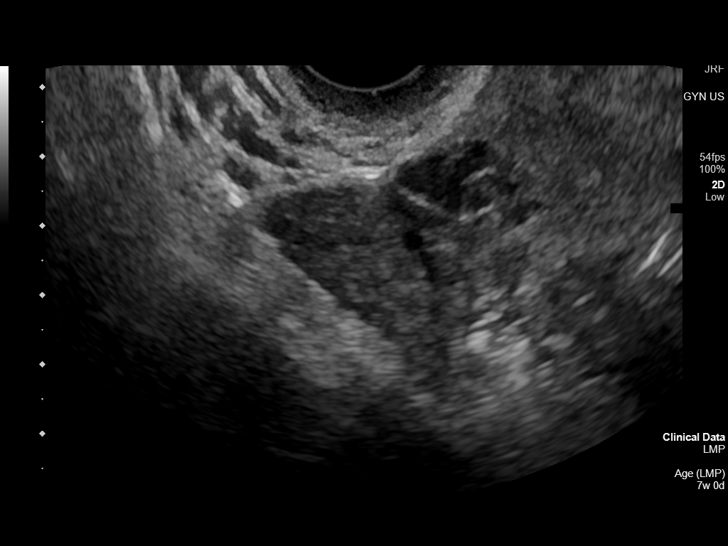
[im 106/111]
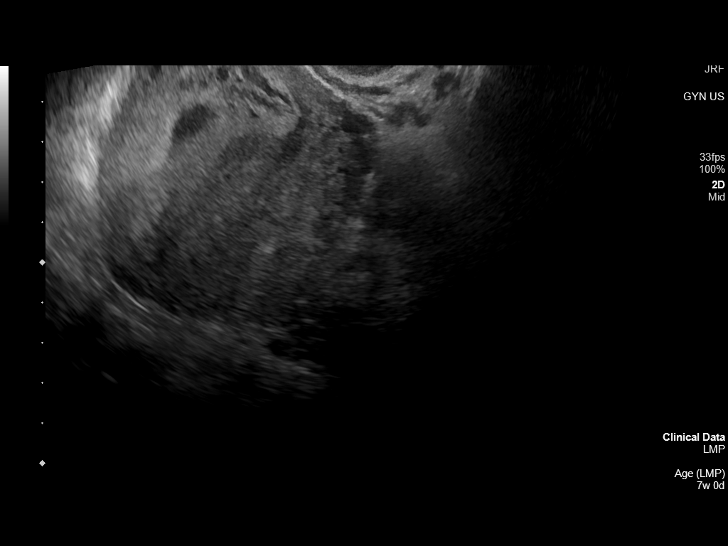

[13 of 28 positions shown; findings below may reference images not displayed]

FINDINGS: Intrauterine gestational sac: Single

Yolk sac:  Visualized.

Embryo:  Visualized.

Cardiac Activity: Visualized.

Heart Rate: 158 bpm

CRL:  9.3 mm   7 w   0 d                  US EDC: 02/19/2021

Subchorionic hemorrhage: Small measuring 2.3 x 0.8 x 2.4 cm, when
using transvaginal measurements.

Maternal uterus/adnexae: Right ovary is visualized and is normal.
Blood flow is noted. Within the left ovary is a 2.4 cm thick walled
cyst that may represent a corpus luteum. Normal blood flow. Trace
free fluid in the pelvis.
IMPRESSION: 1. Single live intrauterine gestation estimated gestational age 7
weeks 0 days based on crown-rump length for ultrasound EDC
02/19/2021.
2. Small subchorionic hemorrhage.
3. Probable corpus luteal cyst in the left ovary.

## 2021-10-07 IMAGING — US US MFM OB FOLLOW-UP
1 series · 14 of 28 positions shown · non-contrast
Comparison: none

[Series 1: us mfm ob follow-up · 61 acquisitions, 14 frames shown]
[im 3/61]
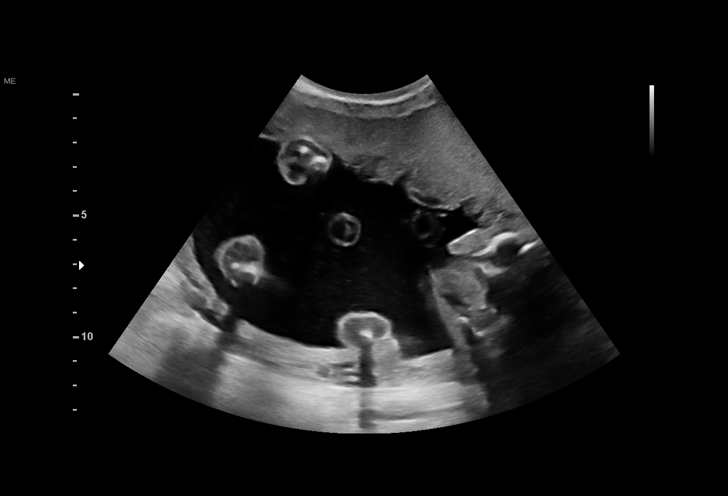
[im 7/61]
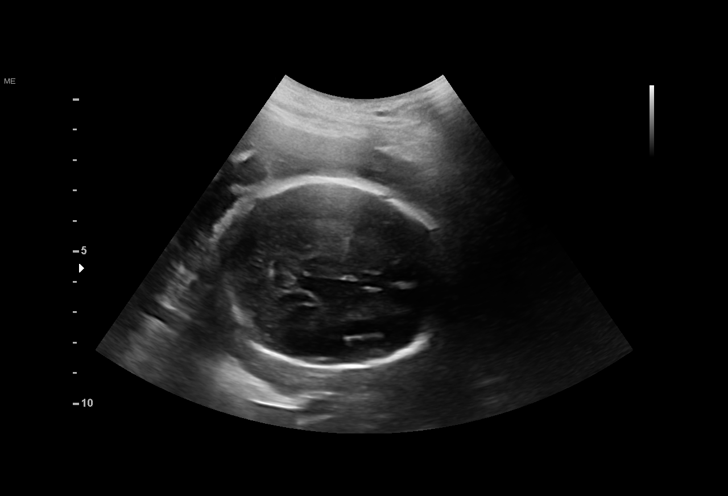
[im 12/61]
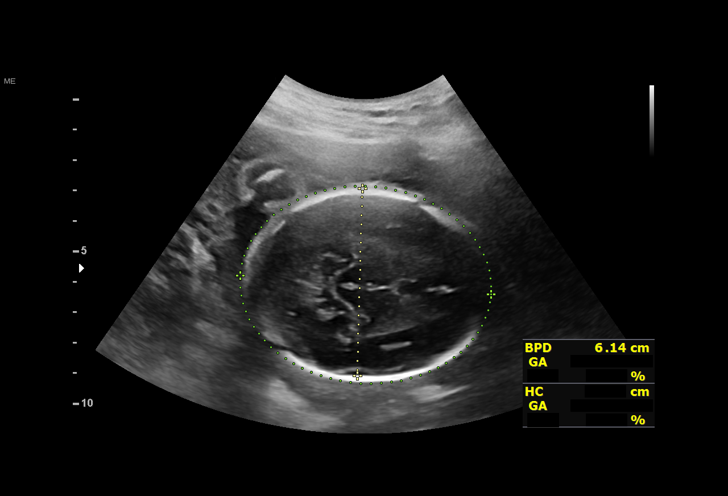
[im 16/61]
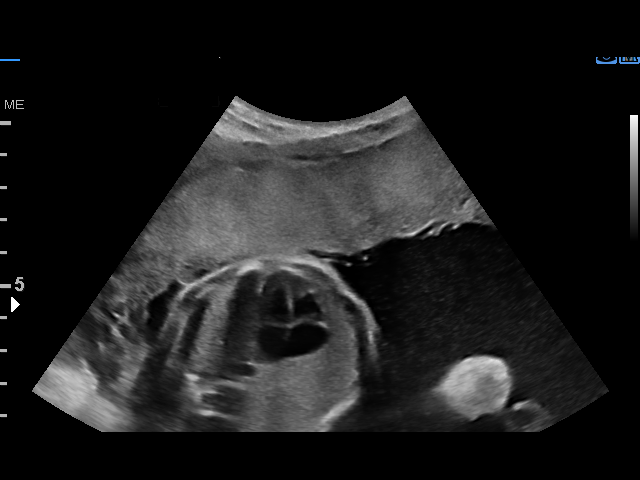
[im 21/61]
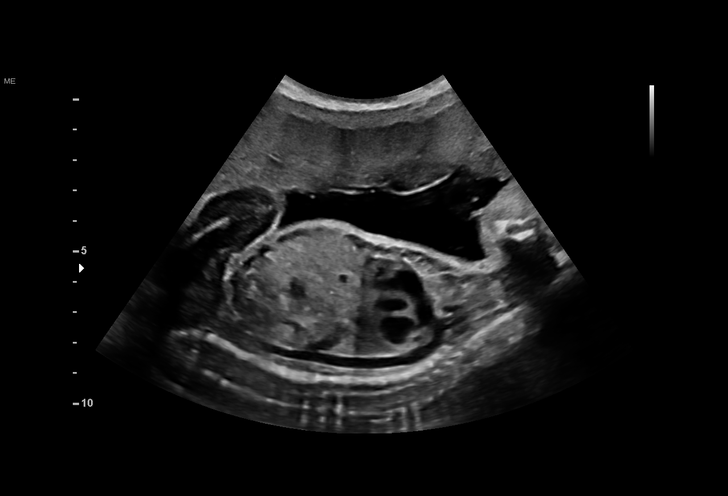
[im 25/61]
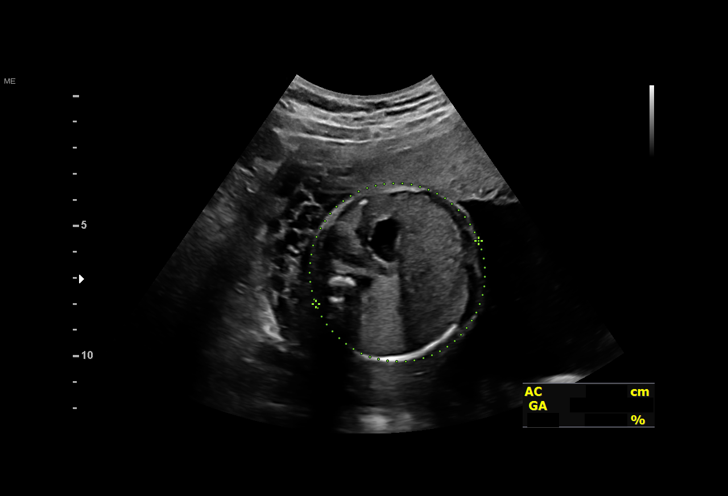
[im 29/61]
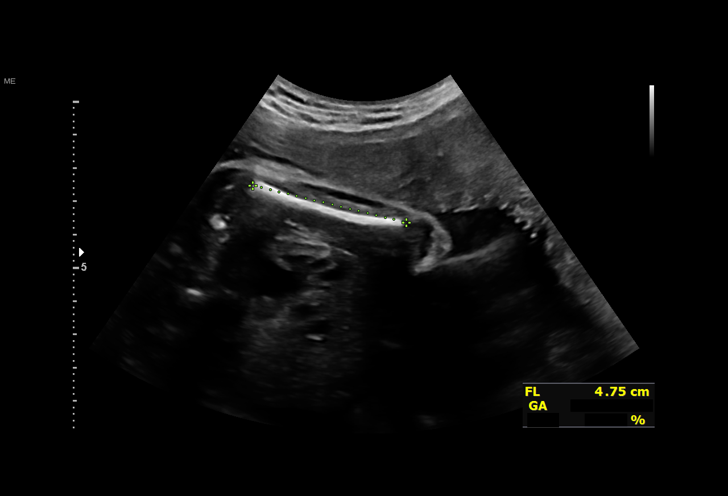
[im 34/61]
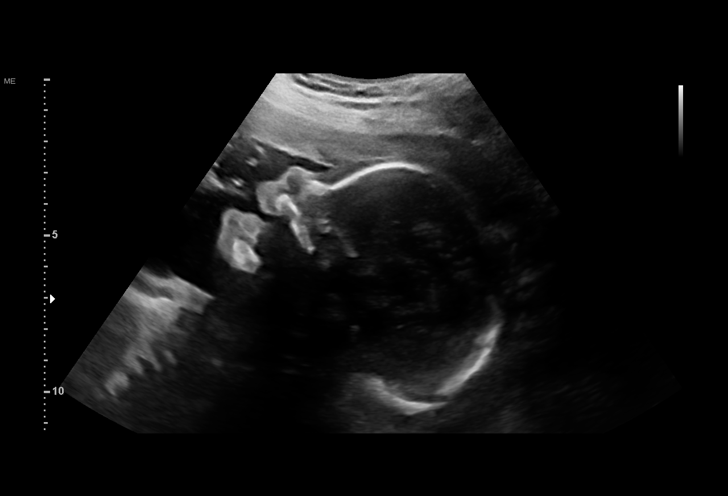
[im 38/61]
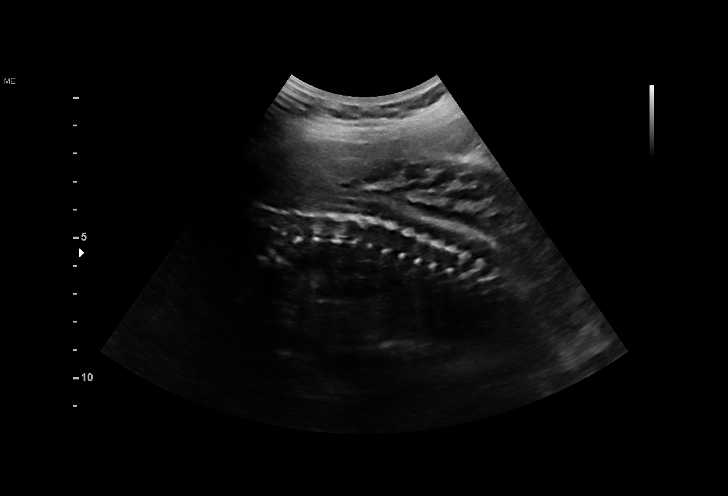
[im 43/61]
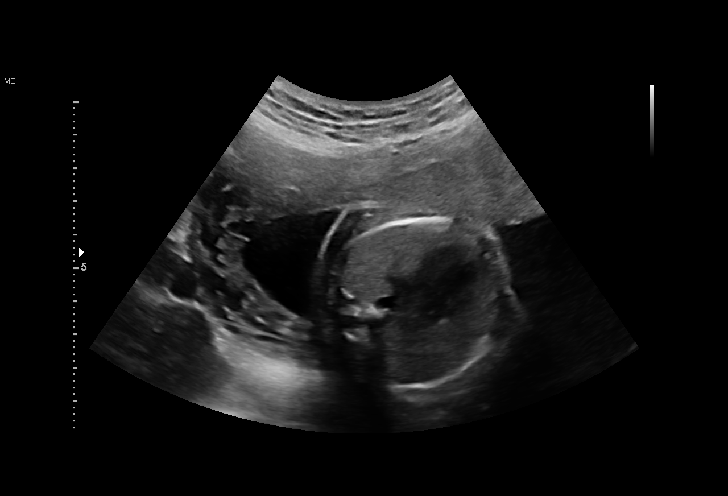
[im 47/61]
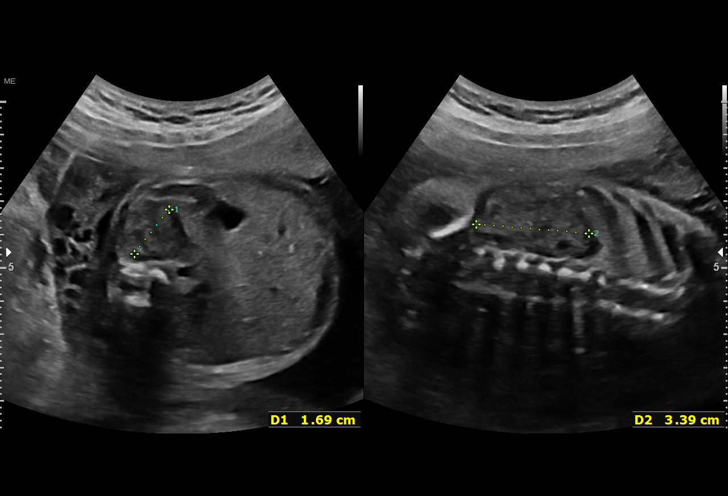
[im 52/61]
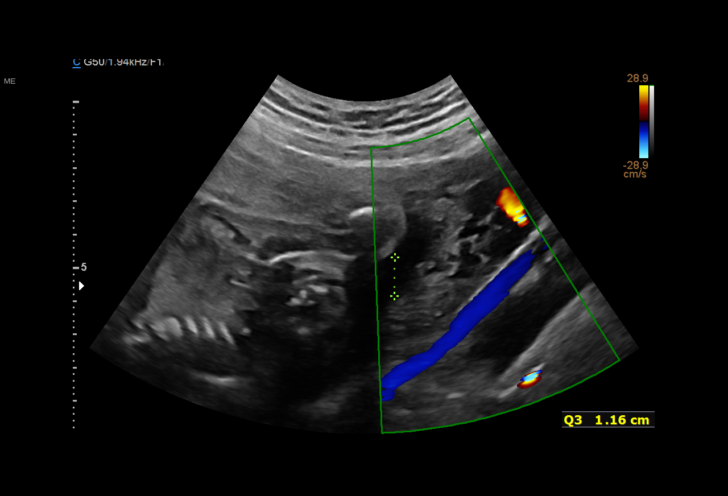
[im 56/61]
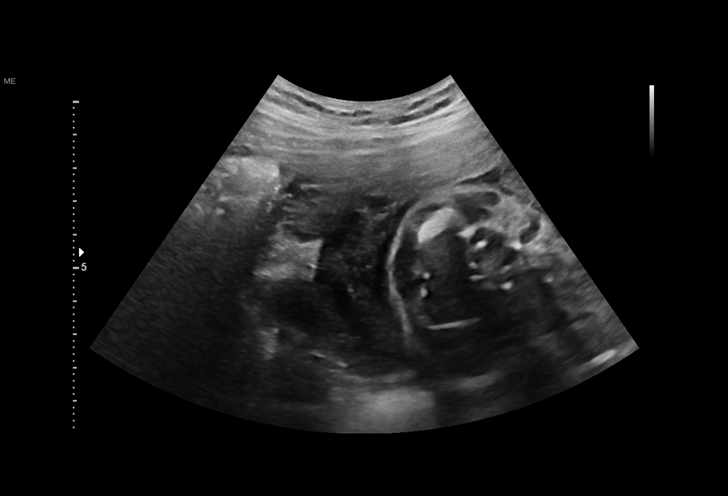
[im 61/61]
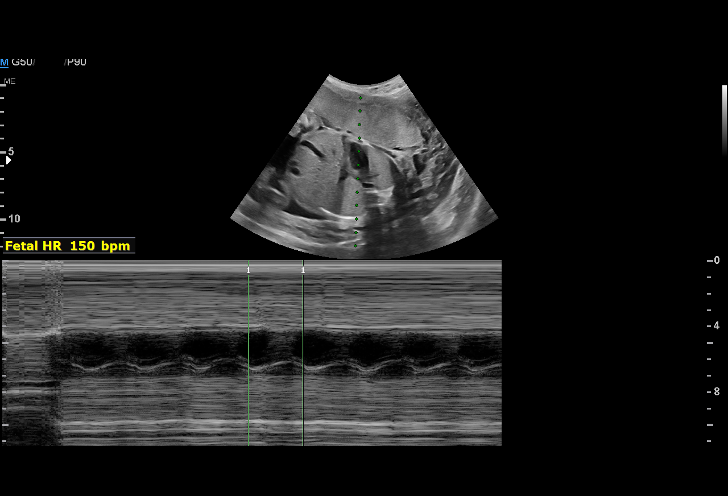

[14 of 28 positions shown; findings below may reference images not displayed]

[REDACTED]
                                                            [id number]

Indications

 25 weeks gestation of pregnancy
 Hypertension - Gestational
 History of placental abruption previous
 pregnancy
 Abnormal genetic findings
Fetal Evaluation

 Num Of Fetuses:         1
 Fetal Heart Rate(bpm):  150
 Cardiac Activity:       Observed
 Presentation:           Cephalic
 Placenta:               Anterior

 Amniotic Fluid
 AFI FV:      Within normal limits

 AFI Sum(cm)     %Tile
 11.5            21
Gestational Age

 LMP:           25w 5d        Date:  05/15/20                 EDD:   02/19/21
 Clinical EDD:  25w 5d                                        EDD:   02/19/21
 Best:          25w 5d     Det. By:  LMP  (05/15/20)          EDD:   02/19/21
Anatomy

 Cranium:               Appears normal         Aortic Arch:            Previously seen
 Cavum:                 Appears normal         Ductal Arch:            Previously seen
 Ventricles:            Appears normal         Diaphragm:              Appears normal
 Choroid Plexus:        Previously seen        Stomach:                Appears normal, left
                                                                       sided
 Cerebellum:            Previously seen        Abdomen:                Appears normal
 Posterior Fossa:       Previously seen        Abdominal Wall:         Previously seen
 Nuchal Fold:           Previously seen        Cord Vessels:           Previously seen
 Face:                  Orbits and profile     Kidneys:                Appear normal
                        previously seen
 Lips:                  Previously seen        Bladder:                Appears normal
 Heart:                 Appears normal         Spine:                  Appears normal
                        (4CH, axis, and
                        situs)
 RVOT:                  Appears normal         Upper Extremities:      Previously seen
 LVOT:                  Appears normal         Lower Extremities:      Previously seen
Cervix Uterus Adnexa

 Cervix
 Length:            4.9  cm.
Impression

 Follow up growth due to chronic hypertension with known
 abnormal sex chromosome detected by NIPT. (see Genetic
 counesling note in [REDACTED])
 Normal interval growth with measurements consistent with
 dates
 Good fetal movement and amniotic fluid volume

 Ms. Mie blood pressure has been normal.
Recommendations

 Follow up growth at 32 weeks was scheduled secondary to
 the diagnosis of chronic hypertension.

## 2022-09-09 ENCOUNTER — Ambulatory Visit: Payer: Medicaid Other | Admitting: Family Medicine

## 2022-09-22 ENCOUNTER — Encounter: Payer: Self-pay | Admitting: Emergency Medicine

## 2022-09-22 DIAGNOSIS — Z79899 Other long term (current) drug therapy: Secondary | ICD-10-CM | POA: Diagnosis not present

## 2022-09-22 DIAGNOSIS — Z7982 Long term (current) use of aspirin: Secondary | ICD-10-CM | POA: Diagnosis not present

## 2022-09-22 DIAGNOSIS — J039 Acute tonsillitis, unspecified: Secondary | ICD-10-CM | POA: Insufficient documentation

## 2022-09-22 DIAGNOSIS — U071 COVID-19: Secondary | ICD-10-CM | POA: Diagnosis not present

## 2022-09-22 DIAGNOSIS — I10 Essential (primary) hypertension: Secondary | ICD-10-CM | POA: Diagnosis not present

## 2022-09-22 DIAGNOSIS — J029 Acute pharyngitis, unspecified: Secondary | ICD-10-CM | POA: Diagnosis present

## 2022-09-22 NOTE — ED Triage Notes (Signed)
Pt presents ambulatory to triage via POV with complaints of sore throat since yesterday. Pt describes the pain as "sharp/stabbing" in the back of her throat.  A&Ox4 at this time. Denies fevers, chills, N/V/D, CP or SOB.

## 2022-09-23 ENCOUNTER — Emergency Department
Admission: EM | Admit: 2022-09-23 | Discharge: 2022-09-23 | Disposition: A | Discharge status: Home / Self Care | Payer: Medicaid Other | Attending: Emergency Medicine | Admitting: Emergency Medicine

## 2022-09-23 DIAGNOSIS — U071 COVID-19: Secondary | ICD-10-CM

## 2022-09-23 DIAGNOSIS — J039 Acute tonsillitis, unspecified: Secondary | ICD-10-CM

## 2022-09-23 DIAGNOSIS — J029 Acute pharyngitis, unspecified: Secondary | ICD-10-CM

## 2022-09-23 LAB — RESP PANEL BY RT-PCR (RSV, FLU A&B, COVID)  RVPGX2
Influenza A by PCR: NEGATIVE
Influenza B by PCR: NEGATIVE
Resp Syncytial Virus by PCR: NEGATIVE
SARS Coronavirus 2 by RT PCR: POSITIVE — AB

## 2022-09-23 LAB — GROUP A STREP BY PCR: Group A Strep by PCR: NOT DETECTED

## 2022-09-23 MED ORDER — MAGIC MOUTHWASH: ORAL | 0 refills | Status: DC

## 2022-09-23 MED ORDER — AMOXICILLIN 250 MG/5ML PO SUSR: 500.0000 mg | Freq: Three times a day (TID) | ORAL | 0 refills | Status: AC

## 2022-09-23 MED ORDER — MAGIC MOUTHWASH
10.0000 mL | Freq: Once | ORAL | Status: AC
  Administered 2022-09-23: 10 mL via ORAL
  Filled 2022-09-23: qty 10

## 2022-09-23 MED ORDER — DEXAMETHASONE 10 MG/ML FOR PEDIATRIC ORAL USE
10.0000 mg | Freq: Once | INTRAMUSCULAR | Status: AC
  Administered 2022-09-23: 10 mg via ORAL
  Filled 2022-09-23: qty 1

## 2022-09-23 MED ORDER — AMOXICILLIN 250 MG/5ML PO SUSR
500.0000 mg | Freq: Once | ORAL | Status: AC
  Administered 2022-09-23: 500 mg via ORAL
  Filled 2022-09-23: qty 10

## 2022-09-23 NOTE — Discharge Instructions (Signed)
1.  Take and finish antibiotic as prescribed. 2.  You may use Magic mouthwash as needed for throat discomfort. 3.  Return to the ER for worsening symptoms, persistent vomiting, difficulty breathing or other concerns.

## 2022-09-23 NOTE — ED Provider Notes (Signed)
[facility] Provider Note    Event Date/Time   First MD Initiated Contact with Patient 09/23/22 0209     (approximate)   History   Sore Throat   HPI  Brittany Young is a 27 y.o. female who presents to the ED from home with a chief complaint of sore throat, nasal congestion and dry cough.  Symptoms x 1 day.  Denies fever/chills, chest pain, shortness of breath, abdominal pain, nausea, vomiting or diarrhea. + Sick contacts.     Past Medical History   Past Medical History:  Diagnosis Date   History of cesarean section 11/03/2016   Hypertension    With past pregnancy   Ovarian cyst      Active Problem List   Patient Active Problem List   Diagnosis Date Noted   Vaginal burning    Bacterial vaginitis    Pregnancy 01/26/2021   Fibrocystic breast changes, right 12/06/2019   History of pregnancy induced hypertension 11/03/2016     Past Surgical History   Past Surgical History:  Procedure Laterality Date   APPENDECTOMY     CESAREAN SECTION N/A 09/2011   CESAREAN SECTION N/A 09/27/2018   Procedure: CESAREAN SECTION repeat;  Surgeon: Rubie Maid, MD;  Location: [facility] ORS;  Service: Obstetrics;  Laterality: N/A;   LAPAROSCOPIC APPENDECTOMY N/A 07/23/2015   Procedure: APPENDECTOMY LAPAROSCOPIC;  Surgeon: Florene Glen, MD;  Location: [facility] ORS;  Service: General;  Laterality: N/A;   WISDOM TOOTH EXTRACTION       Home Medications   Prior to Admission medications   Medication Sig Start Date End Date Taking? Authorizing Provider  amoxicillin (AMOXIL) 250 MG/5ML suspension Take 10 mLs (500 mg total) by mouth 3 (three) times daily for 7 days. 09/23/22 09/30/22 Yes Paulette Blanch, MD  magic mouthwash SOLN 42mL Anbesol 110mL Benadryl 80mL Mylanta  56mL swish, gargle & spit q8hr prn throat discomfort 09/23/22  Yes Paulette Blanch, MD  aspirin EC 81 MG tablet Take 1 tablet (81 mg total) by mouth daily. Take after 12 weeks for prevention of preeclampssia  later in pregnancy 08/07/20   Rubie Maid, MD  metoCLOPramide (REGLAN) 10 MG tablet Take 1 tablet (10 mg total) by mouth every 6 (six) hours as needed. 07/27/20   Carrie Mew, MD  metroNIDAZOLE (METROGEL) 0.75 % vaginal gel Place 1 Applicatorful vaginally at bedtime. Apply one applicatorful to vagina at bedtime for 5 days 01/27/21   Rubie Maid, MD  Prenatal Vit-Fe Fumarate-FA (PRENATAL VITAMIN) 27-0.8 MG TABS Take by mouth.    [provider]     Allergies  Patient has no known allergies.   Family History   Family History  Problem Relation Age of Onset   GER disease Mother    Hypertension Mother    Cancer Maternal Grandfather    Diabetes Maternal Grandfather    Hypertension Maternal Grandfather    Breast cancer Neg Hx    Ovarian cancer Neg Hx    Colon cancer Neg Hx      Physical Exam  Triage Vital Signs: ED Triage Vitals  Enc Vitals Group     BP 09/22/22 2354 (!) 145/90     Pulse Rate 09/22/22 2354 98     Resp 09/22/22 2354 18     Temp 09/22/22 2354 99.4 F (37.4 C)     Temp Source 09/22/22 2354 Oral     SpO2 09/22/22 2354 99 %     Weight 09/22/22 2355 182 lb (82.6 kg)  Height 09/22/22 2355 5\' 3"  (1.6 m)     Head Circumference --      Peak Flow --      Pain Score 09/22/22 2355 8     Pain Loc --      Pain Edu? --      Excl. in [place]? --     Updated Vital Signs: BP (!) 145/90 (BP Location: Left Arm)   Pulse 98   Temp 99.4 F (37.4 C) (Oral)   Resp 18   Ht 5\' 3"  (1.6 m)   Wt 82.6 kg   LMP 09/08/2022 (Exact Date)   SpO2 99%   BMI 32.24 kg/m    General: Awake, no distress.  CV:  RRR.  Good peripheral perfusion.  Resp:  Normal effort.  CTAB. Abd:  No distention.  Other:  Nasal congestion noted.  Oropharynx moderately erythematous with bilateral and symmetrical tonsillar swelling with exudates.  No peritonsillar abscess.  There is no hoarse or muffled voice.  There is no drooling.  Tolerating secretions well.  Shotty anterior cervical  lymphadenopathy noted.  Supple neck without meningismus.   ED Results / Procedures / Treatments  Labs (all labs ordered are listed, but only abnormal results are displayed) Labs Reviewed  RESP PANEL BY RT-PCR (RSV, FLU A&B, COVID)  [id number] - Abnormal; Notable for the following components:      Result Value   SARS Coronavirus 2 by RT PCR POSITIVE (*)    All other components within normal limits  GROUP A STREP BY PCR     EKG  None   RADIOLOGY None   Official radiology report(s): No results found.   PROCEDURES:  Critical Care performed: No  Procedures   MEDICATIONS ORDERED IN ED: Medications  dexamethasone (DECADRON) 10 MG/ML injection for Pediatric ORAL use 10 mg (has no administration in time range)  amoxicillin (AMOXIL) 250 MG/5ML suspension 500 mg (has no administration in time range)  magic mouthwash (has no administration in time range)     IMPRESSION / MDM / ASSESSMENT AND PLAN / ED COURSE  I reviewed the triage vital signs and the nursing notes.                             27 year old female presenting with sore throat, tonsillitis on clinical exam.  She is also positive for COVID-19, unvaccinated.  Does not want Paxlovid.  Will treat with single dose Decadron, Magic mouthwash and start amoxicillin.  Strict return precautions given.  Patient verbalizes understanding and agrees with plan of care.  Patient's presentation is most consistent with acute, uncomplicated illness.   FINAL CLINICAL IMPRESSION(S) / ED DIAGNOSES   Final diagnoses:  Sore throat  COVID-19  Tonsillitis     Rx / DC Orders   ED Discharge Orders          Ordered    amoxicillin (AMOXIL) 250 MG/5ML suspension  3 times daily        09/23/22 0252    magic mouthwash SOLN        09/23/22 0252             Note:  This document was prepared using Dragon voice recognition software and may include unintentional dictation errors.   Paulette Blanch, MD 09/23/22 [phone]

## 2022-09-23 NOTE — ED Notes (Signed)
[facility] [address] called and they do not have ambisol that is an ingredient of the magic mouthwash prescribed.  Per dr Charna Archer can change to lidocaine viscous--with the ingredients 1:1:1 and change quantity to 90 ml.

## 2022-12-15 ENCOUNTER — Ambulatory Visit: Payer: Medicaid Other

## 2022-12-31 ENCOUNTER — Emergency Department
Admission: EM | Admit: 2022-12-31 | Discharge: 2022-12-31 | Disposition: A | Discharge status: Home / Self Care | Payer: Medicaid Other | Attending: Emergency Medicine | Admitting: Emergency Medicine

## 2022-12-31 ENCOUNTER — Emergency Department: Payer: Medicaid Other

## 2022-12-31 ENCOUNTER — Other Ambulatory Visit: Payer: Self-pay

## 2022-12-31 DIAGNOSIS — I10 Essential (primary) hypertension: Secondary | ICD-10-CM | POA: Insufficient documentation

## 2022-12-31 DIAGNOSIS — N3000 Acute cystitis without hematuria: Secondary | ICD-10-CM

## 2022-12-31 DIAGNOSIS — R300 Dysuria: Secondary | ICD-10-CM | POA: Diagnosis present

## 2022-12-31 LAB — URINALYSIS, ROUTINE W REFLEX MICROSCOPIC
Bilirubin Urine: NEGATIVE
Glucose, UA: NEGATIVE mg/dL
Ketones, ur: NEGATIVE mg/dL
Nitrite: NEGATIVE
Protein, ur: >=300 mg/dL — AB
RBC / HPF: >50 RBC/hpf (ref 0–5)
Specific Gravity, Urine: 1.02 (ref 1.005–1.030)
WBC, UA: >50 WBC/hpf (ref 0–5)
pH: 6 (ref 5.0–8.0)

## 2022-12-31 LAB — PREGNANCY, URINE: Preg Test, Ur: NEGATIVE

## 2022-12-31 MED ORDER — CEPHALEXIN 500 MG PO CAPS: 500.0000 mg | ORAL_CAPSULE | Freq: Two times a day (BID) | ORAL | 0 refills | Status: AC

## 2022-12-31 NOTE — ED Provider Notes (Signed)
[facility] Provider Note    Event Date/Time   First MD Initiated Contact with Patient 12/31/22 1032     (approximate)   History   No chief complaint on file.   HPI  Brittany Young is a 27 y.o. female with history of hypertension and as listed in EMR presents to the emergency department for treatment and evaluation of left flank pain and dysuria.  She has had a urinary tract infection in the past with similar symptoms.  Symptoms started suddenly last night.  No history of kidney stone.  No suspected fevers at home.  No nausea or vomiting.Marland Kitchen      Physical Exam   Triage Vital Signs: ED Triage Vitals [12/31/22 1004]  Enc Vitals Group     BP (!) 166/100     Pulse Rate 88     Resp 18     Temp 98.3 F (36.8 C)     Temp Source Oral     SpO2 100 %     Weight      Height      Head Circumference      Peak Flow      Pain Score 8     Pain Loc      Pain Edu?      Excl. in [place]?     Most recent vital signs: Vitals:   12/31/22 1004  BP: (!) 166/100  Pulse: 88  Resp: 18  Temp: 98.3 F (36.8 C)  SpO2: 100%    General: Awake, no distress.  CV:  Good peripheral perfusion.  Resp:  Normal effort.  Abd:  No distention.  Other:  Left CVA tenderness.   ED Results / Procedures / Treatments   Labs (all labs ordered are listed, but only abnormal results are displayed) Labs Reviewed  URINALYSIS, ROUTINE W REFLEX MICROSCOPIC - Abnormal; Notable for the following components:      Result Value   Color, Urine YELLOW (*)    APPearance CLOUDY (*)    Hgb urine dipstick LARGE (*)    Protein, ur >=300 (*)    Leukocytes,Ua LARGE (*)    Bacteria, UA FEW (*)    All other components within normal limits  PREGNANCY, URINE     EKG  Not indicated   RADIOLOGY  CT renal stone study shows mild left periureteral stranding which may be seen in the setting of a recently passed stone.  No other stones noted.  PROCEDURES:  Critical Care performed:  No  Procedures   MEDICATIONS ORDERED IN ED:  Medications - No data to display   IMPRESSION / MDM / ASSESSMENT AND PLAN / ED COURSE   I have reviewed the triage note.  Differential diagnosis includes, but is not limited to, acute cystitis, pyelonephritis, kidney stone  Patient's presentation is most consistent with acute complicated illness / injury requiring diagnostic workup.  27 year old female presenting to the emergency department for treatment and evaluation of acute onset left flank pain last night with dysuria.  See HPI for further details.  Patient appears uncomfortable.  Because symptoms started suddenly, plan will be to get a CT renal stone study if pregnancy test is negative.  Urinalysis is consistent with acute cystitis, pyelonephritis or infected stone.  Pregnancy test is negative.  CT renal stone study ordered.  CT shows possible recently passed stone due to mild left periureteral stranding.  Upon reassessment, patient states that her back pain is gone.  She was advised that she may  have passed a stone.  Plan will be to treat her with Keflex for acute cystitis.  She was encouraged to get a repeat urinalysis in a couple weeks to make sure the infection has cleared.  If symptoms change or worsen or if the pain returns and she is febrile she is to return to the emergency department.      FINAL CLINICAL IMPRESSION(S) / ED DIAGNOSES   Final diagnoses:  Acute cystitis without hematuria     Rx / DC Orders   ED Discharge Orders          Ordered    cephALEXin (KEFLEX) 500 MG capsule  2 times daily        12/31/22 1209             Note:  This document was prepared using Dragon voice recognition software and may include unintentional dictation errors.   Chinita Pester, FNP 12/31/22 1400    Chesley Noon, MD 01/01/23 [phone]

## 2022-12-31 NOTE — Discharge Instructions (Signed)
Take tylenol or ibuprofen for pain.  If you develop a fever or if pain returns/worsens, return to the ER.

## 2022-12-31 NOTE — ED Triage Notes (Signed)
Pt presents to ED with concerns for a UTI. Pt states HX of same. Pt states this has been on going since last night. NAD noted.

## 2022-12-31 NOTE — ED Notes (Signed)
Reports "feel funny when voiding", frequency and not emptying bladder. C/o L flank, side and LLQ/ groin abd pain. Currently on period, therefore unsure of hematuria. Denies fever, dysuria, change in stream, NVD.

## 2023-01-18 ENCOUNTER — Encounter: Payer: Self-pay | Admitting: Obstetrics and Gynecology

## 2023-01-18 ENCOUNTER — Ambulatory Visit (INDEPENDENT_AMBULATORY_CARE_PROVIDER_SITE_OTHER): Payer: Medicaid Other | Admitting: Obstetrics and Gynecology

## 2023-01-18 ENCOUNTER — Other Ambulatory Visit (HOSPITAL_COMMUNITY)
Admission: RE | Admit: 2023-01-18 | Discharge: 2023-01-18 | Disposition: A | Discharge status: Home / Self Care | Payer: Medicaid Other | Source: Ambulatory Visit | Attending: Obstetrics and Gynecology | Admitting: Obstetrics and Gynecology

## 2023-01-18 VITALS — BP 124/80 | HR 84 | Ht 65.0 in | Wt 184.3 lb

## 2023-01-18 DIAGNOSIS — Z01419 Encounter for gynecological examination (general) (routine) without abnormal findings: Secondary | ICD-10-CM

## 2023-01-18 DIAGNOSIS — Z124 Encounter for screening for malignant neoplasm of cervix: Secondary | ICD-10-CM

## 2023-01-18 DIAGNOSIS — Z30013 Encounter for initial prescription of injectable contraceptive: Secondary | ICD-10-CM

## 2023-01-18 DIAGNOSIS — Z113 Encounter for screening for infections with a predominantly sexual mode of transmission: Secondary | ICD-10-CM | POA: Diagnosis present

## 2023-01-18 DIAGNOSIS — A599 Trichomoniasis, unspecified: Secondary | ICD-10-CM

## 2023-01-18 DIAGNOSIS — Z862 Personal history of diseases of the blood and blood-forming organs and certain disorders involving the immune mechanism: Secondary | ICD-10-CM

## 2023-01-18 MED ORDER — MEDROXYPROGESTERONE ACETATE 150 MG/ML IM SUSY: 150.0000 mg | PREFILLED_SYRINGE | INTRAMUSCULAR | Status: AC

## 2023-01-18 NOTE — Patient Instructions (Signed)
I value your feedback and you entrusting us with your care. If you get a [facility] patient survey, I would appreciate you taking the time to let us know about your experience today. Thank you! ? ? ?

## 2023-01-18 NOTE — Progress Notes (Signed)
PCP:  [facility]   Chief Complaint  Patient presents with   Annual Exam     HPI:      Brittany Young is a 27 y.o. [id number] whose LMP was Patient's last menstrual period was 12/27/2022 (exact date)., presents today for her annual examination.  Her menses are regular every 28-30 days, lasting 7 days, mod to heavy flow.  Dysmenorrhea moderate, no meds taken because she doesn't like to swallow pills. She does not have intermenstrual bleeding. Hx of IDA in past, would like CBC today. Taking Fe supp  Sex activity: single partner, contraception - none. Would like to restart BC. Did depo and Mirena in past; would like depo again. No hx of HTN, DVTs, migraines with aura.  Last Pap: 09/19/18 Results were: no abnormalities  Hx of STDs: GC in past; would like full STD testing today  There is no FH of breast cancer. There is no FH of ovarian cancer. The patient does not do self-breast exams.  Tobacco use: The patient denies current or previous tobacco use. Alcohol use: none No drug use.  Exercise: light, walking  She does get adequate calcium but not Vitamin D in her diet.  Patient Active Problem List   Diagnosis Date Noted   Vaginal burning    Bacterial vaginitis    Pregnancy 01/26/2021   Fibrocystic breast changes, right 12/06/2019   History of pregnancy induced hypertension 11/03/2016    Past Surgical History:  Procedure Laterality Date   APPENDECTOMY     CESAREAN SECTION N/A 09/2011   CESAREAN SECTION N/A 09/27/2018   Procedure: CESAREAN SECTION repeat;  Surgeon: Hildred Laser, MD;  Location: [facility] ORS;  Service: Obstetrics;  Laterality: N/A;   LAPAROSCOPIC APPENDECTOMY N/A 07/23/2015   Procedure: APPENDECTOMY LAPAROSCOPIC;  Surgeon: Lattie Haw, MD;  Location: [facility] ORS;  Service: General;  Laterality: N/A;   WISDOM TOOTH EXTRACTION      Family History  Problem Relation Age of Onset   GER disease Mother    Hypertension Mother    Cancer Maternal  Grandfather    Diabetes Maternal Grandfather    Hypertension Maternal Grandfather    Breast cancer Neg Hx    Ovarian cancer Neg Hx    Colon cancer Neg Hx     Social History   Socioeconomic History   Marital status: Single    Spouse name: Janey Greaser   Number of children: Not on file   Years of education: Not on file   Highest education level: Not on file  Occupational History   Not on file  Tobacco Use   Smoking status: Former    Current packs/day: 0.25    Types: Cigars, Cigarettes   Smokeless tobacco: Never   Tobacco comments:    smoke black and milds  Vaping Use   Vaping status: Never Used  Substance and Sexual Activity   Alcohol use: No    Alcohol/week: 0.0 standard drinks of alcohol   Drug use: Not Currently    Types: Marijuana    Comment: last used- 02/06/2018   Sexual activity: Yes    Birth control/protection: None  Other Topics Concern   Not on file  Social History Narrative   Not on file   Social Determinants of Health   Financial Resource Strain: Not on file  Food Insecurity: Not on file  Transportation Needs: Not on file  Physical Activity: Not on file  Stress: Not on file  Social Connections: Not on file  Intimate Partner  Violence: Not on file     Current Outpatient Medications:    aspirin EC 81 MG tablet, Take 1 tablet (81 mg total) by mouth daily. Take after 12 weeks for prevention of preeclampssia later in pregnancy (Patient not taking: Reported on 01/18/2023), Disp: 300 tablet, Rfl: 2   magic mouthwash SOLN, 12mL Anbesol 30mL Benadryl 30mL Mylanta  5mL swish, gargle & spit q8hr prn throat discomfort (Patient not taking: Reported on 01/18/2023), Disp: 72 mL, Rfl: 0   metoCLOPramide (REGLAN) 10 MG tablet, Take 1 tablet (10 mg total) by mouth every 6 (six) hours as needed. (Patient not taking: Reported on 01/18/2023), Disp: 30 tablet, Rfl: 0   metroNIDAZOLE (METROGEL) 0.75 % vaginal gel, Place 1 Applicatorful vaginally at bedtime. Apply one applicatorful to  vagina at bedtime for 5 days (Patient not taking: Reported on 01/18/2023), Disp: 70 g, Rfl: 1   Prenatal Vit-Fe Fumarate-FA (PRENATAL VITAMIN) 27-0.8 MG TABS, Take by mouth. (Patient not taking: Reported on 01/18/2023), Disp: , Rfl:   Current Facility-Administered Medications:    [START ON 01/24/2023] medroxyPROGESTERone Acetate SUSY 150 mg, 150 mg, Intramuscular, Q90 days,      ROS:  Review of Systems  Constitutional:  Negative for fatigue, fever and unexpected weight change.  Respiratory:  Negative for cough, shortness of breath and wheezing.   Cardiovascular:  Negative for chest pain, palpitations and leg swelling.  Gastrointestinal:  Negative for blood in stool, constipation, diarrhea, nausea and vomiting.  Endocrine: Negative for cold intolerance, heat intolerance and polyuria.  Genitourinary:  Negative for dyspareunia, dysuria, flank pain, frequency, genital sores, hematuria, menstrual problem, pelvic pain, urgency, vaginal bleeding, vaginal discharge and vaginal pain.  Musculoskeletal:  Positive for arthralgias. Negative for back pain, joint swelling and myalgias.  Skin:  Negative for rash.  Neurological:  Negative for dizziness, syncope, light-headedness, numbness and headaches.  Hematological:  Negative for adenopathy.  Psychiatric/Behavioral:  Negative for agitation, confusion, sleep disturbance and suicidal ideas. The patient is not nervous/anxious.    BREAST: No symptoms   Objective: BP 124/80   Pulse 84   Ht 5\' 5"  (1.651 m)   Wt 184 lb 4.8 oz (83.6 kg)   LMP 12/27/2022 (Exact Date)   Breastfeeding No   BMI 30.67 kg/m    Physical Exam Constitutional:      Appearance: She is well-developed.  Genitourinary:     Vulva normal.     Right Labia: No rash, tenderness or lesions.    Left Labia: No tenderness, lesions or rash.    No vaginal discharge, erythema or tenderness.      Right Adnexa: not tender and no mass present.    Left Adnexa: not tender and no mass  present.    No cervical friability or polyp.     Uterus is not enlarged or tender.  Breasts:    Right: No mass, nipple discharge, skin change or tenderness.     Left: No mass, nipple discharge, skin change or tenderness.  Neck:     Thyroid: No thyromegaly.  Cardiovascular:     Rate and Rhythm: Normal rate and regular rhythm.     Heart sounds: Normal heart sounds. No murmur heard. Pulmonary:     Effort: Pulmonary effort is normal.     Breath sounds: Normal breath sounds.  Abdominal:     Palpations: Abdomen is soft.     Tenderness: There is no abdominal tenderness. There is no guarding or rebound.  Musculoskeletal:        General: Normal range  of motion.     Cervical back: Normal range of motion.  Lymphadenopathy:     Cervical: No cervical adenopathy.  Neurological:     General: No focal deficit present.     Mental Status: She is alert and oriented to person, place, and time.     Cranial Nerves: No cranial nerve deficit.  Skin:    General: Skin is warm and dry.  Psychiatric:        Mood and Affect: Mood normal.        Behavior: Behavior normal.        Thought Content: Thought content normal.        Judgment: Judgment normal.  Vitals reviewed.    Assessment/Plan: Encounter for annual routine gynecological examination  Cervical cancer screening - Plan: Cytology - PAP  Screening for STD (sexually transmitted disease) - Plan: Hepatitis C antibody, RPR Qual, HIV Antibody (routine testing w rflx), Cytology - PAP  Encounter for initial prescription of injectable contraceptive - Plan: medroxyPROGESTERone Acetate SUSY 150 mg; BC options discussed, pt would like to restart depo. Call with menses for nurse visit. Cont ca/add Vit D.   History of anemia - Plan: CBC with Differential/Platelet; check labs. Will f/u with results.   Meds ordered this encounter  Medications   medroxyPROGESTERone Acetate SUSY 150 mg                F/U  Return in about 1 year (around 01/18/2024) for  annual.  Gaylia Kassel B. Vaniyah Lansky, PA-C 01/18/2023 10:55 AM

## 2023-01-19 LAB — CBC WITH DIFFERENTIAL/PLATELET
Basophils Absolute: 0 10*3/uL (ref 0.0–0.2)
Basos: 1 %
EOS (ABSOLUTE): 0.2 10*3/uL (ref 0.0–0.4)
Eos: 3 %
Hematocrit: 40.2 % (ref 34.0–46.6)
Hemoglobin: 12.8 g/dL (ref 11.1–15.9)
Immature Grans (Abs): 0 10*3/uL (ref 0.0–0.1)
Immature Granulocytes: 0 %
Lymphocytes Absolute: 1.5 10*3/uL (ref 0.7–3.1)
Lymphs: 28 %
MCH: 26.6 pg (ref 26.6–33.0)
MCHC: 31.8 g/dL (ref 31.5–35.7)
MCV: 84 fL (ref 79–97)
Monocytes Absolute: 0.4 10*3/uL (ref 0.1–0.9)
Monocytes: 8 %
Neutrophils Absolute: 3.1 10*3/uL (ref 1.4–7.0)
Neutrophils: 60 %
Platelets: 261 10*3/uL (ref 150–450)
RBC: 4.81 x10E6/uL (ref 3.77–5.28)
RDW: 13.3 % (ref 11.7–15.4)
WBC: 5.1 10*3/uL (ref 3.4–10.8)

## 2023-01-19 LAB — HEPATITIS C ANTIBODY: Hep C Virus Ab: NONREACTIVE

## 2023-01-19 LAB — RPR QUALITATIVE: RPR Ser Ql: NONREACTIVE

## 2023-01-19 LAB — HIV ANTIBODY (ROUTINE TESTING W REFLEX): HIV Screen 4th Generation wRfx: NONREACTIVE

## 2023-01-20 LAB — CYTOLOGY - PAP
Chlamydia: NEGATIVE
Comment: NEGATIVE
Comment: NEGATIVE
Comment: NORMAL
Diagnosis: NEGATIVE
Neisseria Gonorrhea: NEGATIVE
Trichomonas: POSITIVE — AB

## 2023-01-20 MED ORDER — METRONIDAZOLE 500 MG PO TABS: ORAL_TABLET | ORAL | 0 refills | Status: DC

## 2023-01-20 NOTE — Addendum Note (Signed)
Addended by: Althea Grimmer B on: 01/20/2023 05:06 PM   Modules accepted: Orders

## 2023-01-26 ENCOUNTER — Ambulatory Visit (INDEPENDENT_AMBULATORY_CARE_PROVIDER_SITE_OTHER): Payer: Medicaid Other

## 2023-01-26 VITALS — BP 151/101 | HR 81 | Ht 62.0 in | Wt 183.0 lb

## 2023-01-26 DIAGNOSIS — Z3042 Encounter for surveillance of injectable contraceptive: Secondary | ICD-10-CM | POA: Diagnosis not present

## 2023-01-26 DIAGNOSIS — Z3202 Encounter for pregnancy test, result negative: Secondary | ICD-10-CM

## 2023-01-26 LAB — POCT URINE PREGNANCY: Preg Test, Ur: NEGATIVE

## 2023-01-26 MED ORDER — MEDROXYPROGESTERONE ACETATE 150 MG/ML IM SUSP
150.0000 mg | Freq: Once | INTRAMUSCULAR | Status: AC
  Administered 2023-01-26: 150 mg via INTRAMUSCULAR

## 2023-01-26 NOTE — Progress Notes (Signed)
    NURSE VISIT NOTE  Subjective:    Patient ID: Aileene Lanum, female    DOB: Jun 28, 1996, 27 y.o.   MRN: [id number]  HPI  Patient is a 27 y.o. [id number] female who presents for depo provera injection. Pts bp was elevated, she states she has only had high bp after having her baby. I advised her to check it at home and if it stays above 140/90 then she should find a pcp, she reports no h/a or blurred vision today.   Objective:    BP (!) 151/101   Pulse 81   Ht 5\' 2"  (1.575 m)   Wt 183 lb (83 kg)   LMP 01/21/2023 (Exact Date)   BMI 33.47 kg/m   Last Annual: 01/18/23. Last pap: 01/18/2023. Last Depo-Provera: 05/05/2022. Side Effects if any: none. Urine HCG indicated? Yes . Depo-Provera 150 mg IM given by: Cornelius Moras, CMA. Site: Right Ventrogluteal  Lab Review    Assessment:   1. Encounter for Depo-Provera contraception      Plan:   Next appointment due between Oct 9 - Nov 6     Cornelius Moras, CMA

## 2023-01-26 NOTE — Patient Instructions (Signed)
Contraceptive Injection A contraceptive injection is a shot that prevents pregnancy. It is also called a birth control shot. The shot contains the hormone progestin, which prevents pregnancy by: Stopping the ovaries from releasing eggs. Thickening cervical mucus to prevent sperm from entering the cervix. Thinning the lining of the uterus to prevent a fertilized egg from attaching to the uterus. Contraceptive injections are given under the skin (subcutaneous) or into a muscle (intramuscular). For these shots to work, you must get one of them every 3 months (12-13 weeks) from a health care provider. Tell a health care provider about: Any allergies you have. All medicines you are taking, including vitamins, herbs, eye drops, creams, and over-the-counter medicines. Any blood disorders you have. Any medical conditions you have. Whether you are pregnant or may be pregnant. What are the risks? Generally, this is a safe procedure. However, problems may occur, including: Mood changes or depression. Loss of bone density (osteoporosis) after long-term use. Blood clots. This is rare. Higher risk of an egg being fertilized outside your uterus (ectopic pregnancy).This is rare. What happens before the procedure? Your health care provider may do a routine physical exam. You may have a test to make sure you are not pregnant. What happens during the procedure?  The area where the shot will be given will be cleaned and sanitized with alcohol. A needle will be inserted into a muscle in your upper arm or buttock, or into the skin of your thigh or abdomen. The needle will be attached to a syringe with the medicine inside of it. The medicine will be pushed through the syringe and injected into your body. A small bandage (dressing) may be placed over the injection site. What can I expect after the procedure? After the procedure, it is common to have: Soreness around the injection site for a couple of  days. Irregular menstrual bleeding. Weight gain. Breast tenderness. Headaches. Discomfort in your abdomen. Ask your health care provider whether you need to use an added method of birth control (backup contraception), such as a condom, sponge, or spermicide. If the first shot is given 1-7 days after the start of your last menstrual period, you will not need backup contraception. If the first shot is given at any other time during your menstrual cycle, you should avoid having sex. If you do have sex, you will need to use backup contraception for 7 days after you receive the shot. Follow these instructions at home: General instructions Take over-the-counter and prescription medicines only as told by your health care provider. Do not rub or massage the injection site. Track your menstrual periods so you will know if they become irregular. Always use a condom to protect against sexually transmitted infections (STIs). Make sure you schedule an appointment in time for your next shot and mark it on your calendar. You must get an injection every 3 months (12-13 weeks) to prevent pregnancy. Lifestyle Do not use any products that contain nicotine or tobacco. These products include cigarettes, chewing tobacco, and vaping devices, such as e-cigarettes. If you need help quitting, ask your health care provider. Eat foods that are high in calcium and vitamin D, such as milk, cheese, and salmon. Doing this may help with any loss in bone density caused by the contraceptive injection. Ask your health care provider for dietary recommendations. Contact a health care provider if you: Have nausea or vomiting. Have abnormal vaginal discharge or bleeding. Miss a menstrual period or think you might be pregnant. Experience mood changes   or depression. Feel dizzy or light-headed. Have leg pain. Get help right away if you: Have chest pain or cough up blood. Have shortness of breath. Have a severe headache that does  not go away. Have numbness in any part of your body. Have slurred speech or vision problems. Have vaginal bleeding that is abnormally heavy or does not stop, or you have severe pain in your abdomen. Have depression that does not get better with treatment. If you ever feel like you may hurt yourself or others, or have thoughts about taking your own life, get help right away. Go to your nearest emergency department or: Call your local emergency services (911 in the [place] Call a suicide crisis helpline, such as the National Suicide Prevention Lifeline at 1-800-273-8255 or 988 in the [place] This is open 24 hours a day in the [place] Text the Crisis Text Line at 741741 (in the [place] Summary A contraceptive injection is a shot that prevents pregnancy. It is also called the birth control shot. The shot is given under the skin (subcutaneous) or into a muscle (intramuscular). After this procedure, it is common to have soreness around the injection site for a couple of days. To prevent pregnancy, the shot must be given by a health care provider every 3 months (12-13 weeks). After you have the shot, ask your health care provider whether you need to use an added method of birth control (backup contraception), such as a condom, sponge, or spermicide. This information is not intended to replace advice given to you by your health care provider. Make sure you discuss any questions you have with your health care provider. Document Revised: 01/14/2021 Document Reviewed: 12/31/2019 Elsevier Patient Education  2024 [employer] Inc.  

## 2023-04-20 ENCOUNTER — Ambulatory Visit: Payer: Medicaid Other

## 2023-04-22 ENCOUNTER — Ambulatory Visit: Payer: Medicaid Other

## 2023-04-22 VITALS — BP 147/97 | HR 80 | Ht 62.0 in | Wt 182.0 lb

## 2023-04-22 DIAGNOSIS — Z3042 Encounter for surveillance of injectable contraceptive: Secondary | ICD-10-CM

## 2023-04-22 MED ORDER — MEDROXYPROGESTERONE ACETATE 150 MG/ML IM SUSY
150.0000 mg | PREFILLED_SYRINGE | Freq: Once | INTRAMUSCULAR | Status: AC
  Administered 2023-04-22: 150 mg via INTRAMUSCULAR

## 2023-04-22 NOTE — Progress Notes (Cosign Needed Addendum)
    NURSE VISIT NOTE  Subjective:    Patient ID: Brittany Young, female    DOB: 1996/03/14, 27 y.o.   MRN: [id number]  HPI  Patient is a 27 y.o. [id number] female who presents for depo provera injection.   Objective:    BP (!) 147/97   Pulse 80   Ht 5\' 2"  (1.575 m)   Wt 182 lb (82.6 kg)   BMI 33.29 kg/m   Last Annual: 01/18/23. Last pap: 01/18/23. Last Depo-Provera: 01/26/23. Side Effects if any: none. Serum HCG indicated? No . Depo-Provera 150 mg IM given by: Beverely Pace, CMA. Site: Left Ventrogluteal   1. Encounter for management and injection of depo-Provera      Plan:  appointment due between Jan 3-18     Loney Laurence, CMA

## 2023-05-06 ENCOUNTER — Other Ambulatory Visit: Payer: Medicaid Other

## 2023-06-06 ENCOUNTER — Other Ambulatory Visit: Payer: Self-pay

## 2023-06-06 ENCOUNTER — Emergency Department: Payer: Medicaid Other

## 2023-06-06 ENCOUNTER — Emergency Department
Admission: EM | Admit: 2023-06-06 | Discharge: 2023-06-06 | Disposition: A | Discharge status: Home / Self Care | Payer: Medicaid Other | Attending: Emergency Medicine | Admitting: Emergency Medicine

## 2023-06-06 DIAGNOSIS — E876 Hypokalemia: Secondary | ICD-10-CM | POA: Diagnosis not present

## 2023-06-06 DIAGNOSIS — N939 Abnormal uterine and vaginal bleeding, unspecified: Secondary | ICD-10-CM | POA: Diagnosis present

## 2023-06-06 DIAGNOSIS — I10 Essential (primary) hypertension: Secondary | ICD-10-CM | POA: Diagnosis not present

## 2023-06-06 DIAGNOSIS — D219 Benign neoplasm of connective and other soft tissue, unspecified: Secondary | ICD-10-CM

## 2023-06-06 LAB — COMPREHENSIVE METABOLIC PANEL
ALT: 16 U/L (ref 0–44)
AST: 16 U/L (ref 15–41)
Albumin: 4.1 g/dL (ref 3.5–5.0)
Alkaline Phosphatase: 59 U/L (ref 38–126)
Anion gap: 10 (ref 5–15)
BUN: 10 mg/dL (ref 6–20)
CO2: 24 mmol/L (ref 22–32)
Calcium: 9 mg/dL (ref 8.9–10.3)
Chloride: 105 mmol/L (ref 98–111)
Creatinine, Ser: 0.79 mg/dL (ref 0.44–1.00)
GFR, Estimated: >60 mL/min (ref >60)
Glucose, Bld: 103 mg/dL — ABNORMAL HIGH (ref 70–99)
Potassium: 3.2 mmol/L — ABNORMAL LOW (ref 3.5–5.1)
Sodium: 139 mmol/L (ref 135–145)
Total Bilirubin: 0.6 mg/dL (ref <1.2)
Total Protein: 7.7 g/dL (ref 6.5–8.1)

## 2023-06-06 LAB — URINALYSIS, ROUTINE W REFLEX MICROSCOPIC
Bilirubin Urine: NEGATIVE
Glucose, UA: NEGATIVE mg/dL
Hgb urine dipstick: NEGATIVE
Ketones, ur: NEGATIVE mg/dL
Leukocytes,Ua: NEGATIVE
Nitrite: NEGATIVE
Protein, ur: NEGATIVE mg/dL
Specific Gravity, Urine: 1.017 (ref 1.005–1.030)
pH: 7 (ref 5.0–8.0)

## 2023-06-06 LAB — CBC
HCT: 41.7 % (ref 36.0–46.0)
Hemoglobin: 13.6 g/dL (ref 12.0–15.0)
MCH: 27.4 pg (ref 26.0–34.0)
MCHC: 32.6 g/dL (ref 30.0–36.0)
MCV: 83.9 fL (ref 80.0–100.0)
Platelets: 269 10*3/uL (ref 150–400)
RBC: 4.97 MIL/uL (ref 3.87–5.11)
RDW: 12.8 % (ref 11.5–15.5)
WBC: 5.7 10*3/uL (ref 4.0–10.5)
nRBC: 0 % (ref 0.0–0.2)

## 2023-06-06 LAB — LIPASE, BLOOD: Lipase: 28 U/L (ref 11–51)

## 2023-06-06 LAB — WET PREP, GENITAL
Clue Cells Wet Prep HPF POC: NONE SEEN
Sperm: NONE SEEN
Trich, Wet Prep: NONE SEEN
WBC, Wet Prep HPF POC: <10 (ref <10)
Yeast Wet Prep HPF POC: NONE SEEN

## 2023-06-06 LAB — POC URINE PREG, ED: Preg Test, Ur: NEGATIVE

## 2023-06-06 LAB — CHLAMYDIA/NGC RT PCR (ARMC ONLY)
Chlamydia Tr: NOT DETECTED
N gonorrhoeae: NOT DETECTED

## 2023-06-06 MED ORDER — AMLODIPINE BESYLATE 5 MG PO TABS: 5.0000 mg | ORAL_TABLET | Freq: Every day | ORAL | 2 refills | Status: DC

## 2023-06-06 MED ORDER — POTASSIUM CHLORIDE CRYS ER 20 MEQ PO TBCR: 20.0000 meq | EXTENDED_RELEASE_TABLET | Freq: Every day | ORAL | 0 refills | Status: DC

## 2023-06-06 NOTE — ED Triage Notes (Signed)
Pt comes with c/o lower back pain and vaginal bleeding for about 9 days. Pt states some belly pain and cramping. Pt states she on depo and doesn't really have a period. Pt states this is different.

## 2023-06-06 NOTE — Discharge Instructions (Addendum)
Start taking the blood pressure medicine for your elevated blood pressure.  Return to the ER if you develop worsening severe onset of a headache, chest pain, any other concerns otherwise you can follow-up with your primary doctor appointment for recheck of blood pressure and to adjust your medications.  You should call your OB/GYN team to follow-up for your fibroid and return to the ER if you develop worsening symptoms or any other concerns. Your Potassium was slightly low and we gave some medication for that as well.   IMPRESSION: 1. No acute findings.  No signs of ovarian mass or torsion. 2. 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 3. Trace fluid noted within the distal endometrial canal.

## 2023-06-06 NOTE — ED Notes (Addendum)
Per EDP, Funke, patient to self swab.  Swabs set out for patient, verbal instructions provided w/ no further questions at this time.

## 2023-06-06 NOTE — ED Provider Notes (Signed)
[facility] Provider Note    Event Date/Time   First MD Initiated Contact with Patient 06/06/23 1024     (approximate)   History   Vaginal Bleeding   HPI  Brittany Young is a 27 y.o. female who is on the Depo shot who comes in with concerns for lower abdominal cramping.  Patient reports that she typically does not have it.  But for the past 9 days she has had some very light spotting.  Denies it being more than a pad an hour.  Denies concerns for vaginal foreign body.  Denies any fevers.  She states that she is sexually active but with the same partner for 7 years denies any vaginal discharge or concerns for STDs.  Does report some left low back pain associated with it.   Physical Exam   Triage Vital Signs: ED Triage Vitals  Encounter Vitals Group     BP 06/06/23 0955 (!) 184/126     Systolic BP Percentile --      Diastolic BP Percentile --      Pulse Rate 06/06/23 0955 87     Resp 06/06/23 0955 18     Temp 06/06/23 0955 98.8 F (37.1 C)     Temp src --      SpO2 06/06/23 0955 99 %     Weight 06/06/23 0953 179 lb (81.2 kg)     Height 06/06/23 0953 5\' 2"  (1.575 m)     Head Circumference --      Peak Flow --      Pain Score 06/06/23 0953 4     Pain Loc --      Pain Education --      Exclude from Growth Chart --     Most recent vital signs: Vitals:   06/06/23 0955  BP: (!) 184/126  Pulse: 87  Resp: 18  Temp: 98.8 F (37.1 C)  SpO2: 99%     General: Awake, no distress.  CV:  Good peripheral perfusion.  Resp:  Normal effort.  Abd:  No distention.  Other:  Some mild suprapubic tenderness without any rebound or guarding.   ED Results / Procedures / Treatments   Labs (all labs ordered are listed, but only abnormal results are displayed) Labs Reviewed  CBC  LIPASE, BLOOD  COMPREHENSIVE METABOLIC PANEL  URINALYSIS, ROUTINE W REFLEX MICROSCOPIC  POC URINE PREG, ED   RADIOLOGY I have reviewed the ultrasound personally and  interpreted and positive fibroid.     PROCEDURES:  Critical Care performed: No  Procedures   MEDICATIONS ORDERED IN ED:   IMPRESSION / MDM / ASSESSMENT AND PLAN / ED COURSE  I reviewed the triage vital signs and the nursing notes.   Patient's presentation is most consistent with acute presentation with potential threat to life or bodily function.   Will get ultrasound to evaluate for torsion, fibroids, urine to evaluate for pregnancy, UTI.  Patient significantly hypertensive she will need to follow this up with her PCP for recheck.  Pricey test was negative.  Lipase normal CMP shows slightly low potassium CBC normal with normal hemoglobin.  Urine without evidence of UTI.    IMPRESSION: 1. No acute findings.  No signs of ovarian mass or torsion. 2. 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 3. Trace fluid noted within the distal endometrial canal.   Re-evaluation her repeat blood pressure remained significantly elevated.  She reports some intermittent high blood pressures for  the past few months.  I reviewed her last 2 prior checks and her blood pressure was over 140s both times.  We discussed if she was having any symptoms related to the elevated blood pressure.  She does report some intermittent headaches but denies any severe change to her headaches today.  We discussed CT imaging to rule out any type of intercranial hemorrhage but patient declined stating that today's headache was her typical headache.  Her cranial nerve exams was normal and given she felt at her baseline self she did not want to have any CT imaging done.  However she was willing to be started on medication for her uncontrolled hypertension.  Given her hypokalemia we will avoid a diuretic and start patient on amlodipine.  She has got no lower leg edema.  For her fibroid she will follow-up with her OB/GYN.  She also has follow-up with her PCP in 10 days and she will follow-up there  for her blood pressure.      FINAL CLINICAL IMPRESSION(S) / ED DIAGNOSES   Final diagnoses:  Vaginal bleeding  Uncontrolled hypertension     Rx / DC Orders   ED Discharge Orders          Ordered    amLODipine (NORVASC) 5 MG tablet  Daily        06/06/23 1410             Note:  This document was prepared using Dragon voice recognition software and may include unintentional dictation errors.   Concha Se, MD 06/06/23 [phone]

## 2023-06-06 NOTE — ED Notes (Signed)
Patient transported to Ultrasound 

## 2023-06-12 NOTE — Progress Notes (Unsigned)
New patient visit  Patient: Brittany Young   DOB: Nov 10, 1995   27 y.o. Female  MRN: [id number] Visit Date: 06/16/2023  Today's healthcare provider: Debera Lat, PA-C   No chief complaint on file.  Subjective    Brittany Young is a 27 y.o. female who presents today as a new patient to establish care.  HPI  *** Discussed the use of AI scribe software for clinical note transcription with the patient, who gave verbal consent to proceed.  History of Present Illness            Past Medical History:  Diagnosis Date  . History of cesarean section 11/03/2016  . Hypertension    With past pregnancy  . Ovarian cyst    Past Surgical History:  Procedure Laterality Date  . APPENDECTOMY    . CESAREAN SECTION N/A 09/2011  . CESAREAN SECTION N/A 09/27/2018   Procedure: CESAREAN SECTION repeat;  Surgeon: Hildred Laser, MD;  Location: [facility] ORS;  Service: Obstetrics;  Laterality: N/A;  . LAPAROSCOPIC APPENDECTOMY N/A 07/23/2015   Procedure: APPENDECTOMY LAPAROSCOPIC;  Surgeon: Lattie Haw, MD;  Location: [facility] ORS;  Service: General;  Laterality: N/A;  . WISDOM TOOTH EXTRACTION     Family Status  Relation Name Status  . Mother  Alive  . Father  Deceased  . MGF  Alive  . Neg Hx  (Not Specified)  No partnership data on file   Family History  Problem Relation Age of Onset  . GER disease Mother   . Hypertension Mother   . Cancer Maternal Grandfather   . Diabetes Maternal Grandfather   . Hypertension Maternal Grandfather   . Breast cancer Neg Hx   . Ovarian cancer Neg Hx   . Colon cancer Neg Hx    Social History   Socioeconomic History  . Marital status: Single    Spouse name: Janey Greaser  . Number of children: Not on file  . Years of education: Not on file  . Highest education level: Not on file  Occupational History  . Not on file  Tobacco Use  . Smoking status: Former    Current packs/day: 0.25    Types: Cigars, Cigarettes  . Smokeless tobacco: Never  .  Tobacco comments:    smoke black and milds  Vaping Use  . Vaping status: Never Used  Substance and Sexual Activity  . Alcohol use: No    Alcohol/week: 0.0 standard drinks of alcohol  . Drug use: Not Currently    Types: Marijuana    Comment: last used- 02/06/2018  . Sexual activity: Yes    Birth control/protection: None  Other Topics Concern  . Not on file  Social History Narrative  . Not on file   Social Determinants of Health   Financial Resource Strain: Not on file  Food Insecurity: Not on file  Transportation Needs: Not on file  Physical Activity: Not on file  Stress: Not on file  Social Connections: Not on file   Outpatient Medications Prior to Visit  Medication Sig  . amLODipine (NORVASC) 5 MG tablet Take 1 tablet (5 mg total) by mouth daily.  Marland Kitchen aspirin EC 81 MG tablet Take 1 tablet (81 mg total) by mouth daily. Take after 12 weeks for prevention of preeclampssia later in pregnancy (Patient not taking: Reported on 01/18/2023)  . magic mouthwash SOLN 12mL Anbesol 30mL Benadryl 30mL Mylanta  5mL swish, gargle & spit q8hr prn throat discomfort (Patient not taking: Reported on 01/18/2023)  . metoCLOPramide (REGLAN)  10 MG tablet Take 1 tablet (10 mg total) by mouth every 6 (six) hours as needed. (Patient not taking: Reported on 01/18/2023)  . metroNIDAZOLE (FLAGYL) 500 MG tablet Take 2 tabs BID for 1 day  . metroNIDAZOLE (METROGEL) 0.75 % vaginal gel Place 1 Applicatorful vaginally at bedtime. Apply one applicatorful to vagina at bedtime for 5 days (Patient not taking: Reported on 01/18/2023)  . potassium chloride SA (KLOR-CON M) 20 MEQ tablet Take 1 tablet (20 mEq total) by mouth daily for 3 days.  . Prenatal Vit-Fe Fumarate-FA (PRENATAL VITAMIN) 27-0.8 MG TABS Take by mouth. (Patient not taking: Reported on 01/18/2023)   Facility-Administered Medications Prior to Visit  Medication Dose Route Frequency Provider  . medroxyPROGESTERone Acetate SUSY 150 mg  150 mg Intramuscular Q90  days    No Known Allergies  There is no immunization history for the selected administration types on file for this patient.  Health Maintenance  Topic Date Due  . COVID-19 Vaccine (1) Never done  . DTaP/Tdap/Td (1 - Tdap) Never done  . INFLUENZA VACCINE  Never done  . Cervical Cancer Screening (Pap smear)  01/17/2026  . Hepatitis C Screening  Completed  . HIV Screening  Completed  . HPV VACCINES  Aged Out    Patient Care Team: [facility], [facility] as PCP - [facility])  Review of Systems  All other systems reviewed and are negative. Except see HPI   {Insert previous labs (optional):23779} {See past labs  Heme  Chem  Endocrine  Serology  Results Review (optional):1}   Objective    There were no vitals taken for this visit. {Insert last BP/Wt (optional):23777}{See vitals history (optional):1}   Physical Exam Vitals reviewed.  Constitutional:      General: She is not in acute distress.    Appearance: Normal appearance. She is well-developed. She is not diaphoretic.  HENT:     Head: Normocephalic and atraumatic.  Eyes:     General: No scleral icterus.    Conjunctiva/sclera: Conjunctivae normal.  Neck:     Thyroid: No thyromegaly.  Cardiovascular:     Rate and Rhythm: Normal rate and regular rhythm.     Pulses: Normal pulses.     Heart sounds: Normal heart sounds. No murmur heard. Pulmonary:     Effort: Pulmonary effort is normal. No respiratory distress.     Breath sounds: Normal breath sounds. No wheezing, rhonchi or rales.  Musculoskeletal:     Cervical back: Neck supple.     Right lower leg: No edema.     Left lower leg: No edema.  Lymphadenopathy:     Cervical: No cervical adenopathy.  Skin:    General: Skin is warm and dry.     Findings: No rash.  Neurological:     Mental Status: She is alert and oriented to person, place, and time. Mental status is at baseline.  Psychiatric:        Mood and Affect: Mood normal.         Behavior: Behavior normal.   Depression Screen     No data to display         No results found for any visits on 06/16/23.  Assessment & Plan     *** Assessment and Plan              Encounter to establish care Welcomed to our clinic Reviewed past medical hx, social hx, family hx and surgical hx Pt advised to send all vaccination records or  screening   No follow-ups on file.     [facility]

## 2023-06-16 ENCOUNTER — Ambulatory Visit (INDEPENDENT_AMBULATORY_CARE_PROVIDER_SITE_OTHER): Payer: Medicaid Other | Admitting: Physician Assistant

## 2023-06-16 DIAGNOSIS — Z91199 Patient's noncompliance with other medical treatment and regimen due to unspecified reason: Secondary | ICD-10-CM

## 2023-07-22 ENCOUNTER — Ambulatory Visit (INDEPENDENT_AMBULATORY_CARE_PROVIDER_SITE_OTHER): Payer: Medicaid Other

## 2023-07-22 VITALS — BP 148/96 | HR 83 | Ht 62.0 in | Wt 178.0 lb

## 2023-07-22 DIAGNOSIS — Z3042 Encounter for surveillance of injectable contraceptive: Secondary | ICD-10-CM

## 2023-07-22 MED ORDER — MEDROXYPROGESTERONE ACETATE 150 MG/ML IM SUSY
150.0000 mg | PREFILLED_SYRINGE | Freq: Once | INTRAMUSCULAR | Status: AC
  Administered 2023-07-22: 150 mg via INTRAMUSCULAR

## 2023-07-22 NOTE — Patient Instructions (Signed)

## 2023-07-22 NOTE — Progress Notes (Signed)
    NURSE VISIT NOTE  Subjective:    Patient ID: Brittany Young, female    DOB: Jan 21, 1996, 28 y.o.   MRN: [id number]  HPI  Patient is a 28 y.o. [id number] female who presents for depo provera injection.   Objective:    BP (!) 148/96   Pulse 83   Ht 5\' 2"  (1.575 m)   Wt 178 lb (80.7 kg)   LMP 05/29/2023 (Exact Date)   BMI 32.56 kg/m   Last Annual: 01/18/23. Last pap: 01/18/23. Last Depo-Provera: 04/22/23. Side Effects if any: none. Serum HCG indicated? No . Depo-Provera 150 mg IM given by: Donnetta Hail, CMA. Site: Right Upper Outer Quandrant   Assessment:   1. Encounter for surveillance of injectable contraceptive      Plan:   Next appointment due between April 4 and April 18.    Donnetta Hail, CMA

## 2023-07-29 ENCOUNTER — Ambulatory Visit: Payer: Medicaid Other | Admitting: Physician Assistant

## 2023-10-13 NOTE — Progress Notes (Deleted)
    NURSE VISIT NOTE  Subjective:    Patient ID: Brittany Young, female    DOB: March 23, 1996, 28 y.o.   MRN: [id number]  HPI  Patient is a 28 y.o. [id number] female who presents for depo provera injection.   Objective:    There were no vitals taken for this visit.  Last Annual: ***. Last pap: ***. Last Depo-Provera: ***. Side Effects if any: {NONE:21772}***. Serum HCG indicated? {YES/NO:21197}. Depo-Provera 150 mg IM given by: {AOB Clinical JYNWG:95621}. Site: {AOB INJ [id number]  Lab Review  @THIS  VISIT ONLY@  Assessment:   No diagnosis found.   Plan:   Next appointment due between June 27th  and July 25th,2025.    Cornelius Moras, CMA

## 2023-10-14 ENCOUNTER — Ambulatory Visit: Payer: Medicaid Other

## 2023-10-17 ENCOUNTER — Ambulatory Visit

## 2023-10-17 ENCOUNTER — Other Ambulatory Visit (HOSPITAL_COMMUNITY)
Admission: RE | Admit: 2023-10-17 | Discharge: 2023-10-17 | Disposition: A | Discharge status: Home / Self Care | Source: Ambulatory Visit | Attending: Obstetrics and Gynecology | Admitting: Obstetrics and Gynecology

## 2023-10-17 VITALS — BP 173/111 | HR 89 | Ht 62.0 in | Wt 172.0 lb

## 2023-10-17 DIAGNOSIS — B379 Candidiasis, unspecified: Secondary | ICD-10-CM

## 2023-10-17 DIAGNOSIS — Z113 Encounter for screening for infections with a predominantly sexual mode of transmission: Secondary | ICD-10-CM | POA: Diagnosis present

## 2023-10-17 DIAGNOSIS — Z3042 Encounter for surveillance of injectable contraceptive: Secondary | ICD-10-CM | POA: Diagnosis not present

## 2023-10-17 DIAGNOSIS — R399 Unspecified symptoms and signs involving the genitourinary system: Secondary | ICD-10-CM

## 2023-10-17 DIAGNOSIS — B9689 Other specified bacterial agents as the cause of diseases classified elsewhere: Secondary | ICD-10-CM

## 2023-10-17 LAB — POCT URINALYSIS DIPSTICK
Bilirubin, UA: NEGATIVE
Glucose, UA: NEGATIVE
Ketones, UA: NEGATIVE
Nitrite, UA: NEGATIVE
Protein, UA: POSITIVE — AB
Spec Grav, UA: 1.015 (ref 1.010–1.025)
Urobilinogen, UA: 0.2 U/dL
pH, UA: 5 (ref 5.0–8.0)

## 2023-10-17 MED ORDER — MEDROXYPROGESTERONE ACETATE 150 MG/ML IM SUSY
150.0000 mg | PREFILLED_SYRINGE | Freq: Once | INTRAMUSCULAR | Status: AC
  Administered 2023-10-17: 150 mg via INTRAMUSCULAR

## 2023-10-17 NOTE — Progress Notes (Signed)
    NURSE VISIT NOTE  Subjective:    Patient ID: Brittany Young, female    DOB: 1996/06/04, 28 y.o.   MRN: [id number]  HPI  Patient is a 28 y.o. Brittany Young female who presents for depo provera injection. Patient would like STD testing and has complaints of a tingle sensation when urinating. Denies burning and frequency urinating. Had some cramping today. Both of her blood pressure readings were high. Patient stated she last took blood pressure medicine about a week ago. States she feels fine. Advised to follow up with her primary care regarding blood pressure.   Objective:    BP (!) 173/111   Pulse 89   Ht 5\' 2"  (1.575 m)   Wt 172 lb (78 kg)   LMP 08/26/2023 (Exact Date)   BMI 31.46 kg/m   Last Annual: 01/18/23. Last pap: 01/18/23. Last Depo-Provera: 07/22/23. Side Effects if any: none. Serum HCG indicated? No . Depo-Provera 150 mg IM given by: Higinio Love, CMA. Site: Left Upper Outer Quandrant    Assessment:   1. Encounter for surveillance of injectable contraceptive   2. Screening for STD (sexually transmitted disease)   3. UTI symptoms      Plan:   Next appointment due between June 30 and July 14. Aptima and urine culture sent to lab. Will wait for results to be treated if needed.    Higinio Love, CMA

## 2023-10-17 NOTE — Patient Instructions (Addendum)
 Chlamydia, Female  Chlamydia is a sexually transmitted infection (STI). This infection spreads through sexual contact. Chlamydia can occur in different areas of the body, including: The urethra. This is the part of the body that drains urine from the bladder. The cervix. This is the lowest part of the uterus. The throat. The rectum. This condition is not difficult to treat. However, if left untreated, chlamydia can lead to more serious health problems, including pelvic inflammatory disease (PID). PID can increase your risk of being unable to have children. In pregnant women, untreated chlamydia can cause serious complications during pregnancy or health problems for the baby after delivery. What are the causes? This condition is caused by a bacteria called Chlamydia trachomatis. The bacteria are spread from an infected partner during sexual activity. Chlamydia can spread through contact with the genitals, mouth, or rectum. What increases the risk? The following factors may make you more likely to develop this condition: Not using a condom the right way or not using a condom every time you have sex. Having a new sex partner or having more than one sex partner. Being sexually active before age 53. What are the signs or symptoms? In some cases, there are no symptoms, especially early in the infection. If symptoms develop, they may include: Urinating often, or a burning feeling during urination. Redness, soreness, or swelling of the vagina or rectum. Discharge coming from the vagina or rectum. Pain in the abdomen. Pain during sex. Bleeding between menstrual periods or irregular periods. How is this diagnosed? This condition may be diagnosed with: Urine tests. Swab tests. Depending on your symptoms, your health care provider may use a cotton swab to collect a fluid sample from your vagina, rectum, nose, or throat to test for the bacteria. A pelvic exam. How is this treated? This condition is  treated with antibiotic medicines. Follow these instructions at home: Sexual activity Tell your sex partner or partners about your infection. These include any partners for oral, anal, or vaginal sex that you have had within 60 days of when your symptoms started. Sex partners should also be treated, even if they have no signs of the infection. Do not have sex until you and your sex partners have completed treatment and your health care provider says it is okay. If your health care provider prescribed you a single-dose medicine as treatment, wait at least 7 days after taking the medicine before having sex. General instructions Take over-the-counter and prescription medicines as told by your health care provider. Finish all antibiotic medicine even when you start to feel better. It is up to you to get your test results. Ask your health care provider, or the department that is doing the test, when your results will be ready. Keep all follow-up visits. This is important. You may need to be tested for infection again 3 months after treatment. How is this prevented? You can lower your risk of getting chlamydia by: Using latex or polyurethane condoms correctly every time you have sex. Not having multiple sex partners. Asking if your sex partner has been tested for STIs and had negative results. Getting regular health screenings to check for STIs. Contact a health care provider if: You develop new symptoms or your symptoms are getting worse. Your symptoms do not get better after treatment. You have a fever or chills. You have pain during sex. You have irregular menstrual periods, or you have bleeding between periods or after sex. You develop flu-like symptoms, such as night sweats, sore throat,  or muscle aches. You are unable to take your antibiotic medicine as prescribed. Summary Chlamydia is a sexually transmitted infection (STI) that is caused by bacteria. This infection spreads through sexual  contact. This condition is treated with antibiotic medicines. If left untreated, chlamydia can lead to more serious health problems, including pelvic inflammatory disease (PID). Your sex partners will also need to be treated. Do not have sex until both you and your partner have been treated. Take medicines as directed by your health care provider and keep all follow-up visits to ensure your infection has been completely treated. This information is not intended to replace advice given to you by your health care provider. Make sure you discuss any questions you have with your health care provider. Document Revised: 04/06/2021 Document Reviewed: 04/13/2021 Elsevier Patient Education  2024 [employer]  Gonorrhea Gonorrhea is a sexually transmitted infection (STI) that can infect any person. If left untreated, this infection can: Damage the reproductive organs. Spread to other parts of the body. Cause someone to be unable to have children (infertility). Harm an unborn baby if an infected person is pregnant. It is important to get treatment for gonorrhea as soon as possible. All of your sex partners may also need to be treated for the infection. What are the causes? This condition is caused by bacteria called Neisseria gonorrhoeae. The infection is spread from person to person through sexual contact, including oral, anal, and vaginal sex. The infection can also pass from a pregnant person to the baby during birth. What increases the risk? The following factors may make you more likely to develop this condition: Being a woman younger than 25 years and sexually active. Being a man who has sex with men. Having a new sex partner or having multiple partners. Having a sex partner who has an STI. Not using condoms correctly or not using condoms every time you have sex. Having a history of STIs. What are the signs or symptoms? When symptoms occur, they may include: Abnormal discharge from the penis  or vagina. The discharge may be cloudy, thick, or yellow-green in color. Pain or burning when you urinate. Itching, irritation, pain, bleeding, or discharge from the rectum. This may occur if the infection was spread by anal sex. Sore throat or swollen lymph nodes in the neck. This may occur if the infection was spread by oral sex. Pain or swelling in the testicles. Bleeding between menstrual periods. If the infection has spread to other areas of the body, symptoms may include: Fever. Eye irritation. Swelling, redness, warmth, and pain in the joints. Rashes. In some cases, there are no symptoms. How is this diagnosed? This condition is diagnosed based on: A physical exam. A swab of fluid. The swab of fluid may be taken from the penis, vagina, throat, or rectum. Urine tests. Not all test results will be available during your visit. How is this treated? This condition is treated with antibiotic medicines. It is important to start treatment as soon as possible. Early treatment may prevent some problems from developing. You should not have sex during treatment. All types of sexual activity should be avoided for at least 7 days after treatment is complete and until your sex partner or partners have been treated. Follow these instructions at home: Take over-the-counter and prescription medicines as told by your health care provider. Finish all antibiotic medicine even when you start to feel better. Do not have sex during treatment. Do not have sex until at least 7 days after  you and your partner or partners have finished treatment and your health care provider says it is okay. It is up to you to get your test results. Ask your health care provider, or the department that is doing the test, when your results will be ready. If you get a positive result on your gonorrhea test, tell your recent sex partners. These include any partners for oral, anal, or vaginal sex. They need to be checked for  gonorrhea even if they do not have symptoms. They may need treatment, even if they get negative results on their gonorrhea tests. Keep all follow-up visits. This is important. How is this prevented?  Use latex or polyurethane condoms correctly every time you have sex. Ask if your sex partner or partners have been tested for STIs and had negative results. Avoid having multiple sex partners. Get regular health screenings to check for STIs. Contact a health care provider if: Your symptoms do not get better after a few days of taking antibiotics. Your symptoms get worse. You cannot take your medicine as directed by your health care provider. You develop new symptoms, including: Eye irritation. Swelling, redness, warmth, and pain in the joints. Rashes. You have a fever. Summary Gonorrhea is a sexually transmitted infection (STI) that can infect any person. This infection is spread from person to person through sexual contact, including oral, anal, and vaginal sex. The infection can also pass from a pregnant person to the baby during birth. Symptoms include abnormal discharge, pain or burning while urinating, or pain in the rectum. This condition is treated with antibiotic medicines. Do not have sex until at least 7 days after both you and any sex partners have completed antibiotic treatment. Tell your health care provider if you have trouble taking your medicine, your symptoms get worse, or you have new symptoms. Keep all follow-up visits. This information is not intended to replace advice given to you by your health care provider. Make sure you discuss any questions you have with your health care provider. Document Revised: 05/14/2021 Document Reviewed: 05/14/2021 Elsevier Patient Education  2024 [employer]  Urinary Tract Infection, Female A urinary tract infection (UTI) is an infection in your urinary tract. The urinary tract is made up of organs that make, store, and get rid of pee  (urine) in your body. These organs include: The kidneys. The ureters. The bladder. The urethra. What are the causes? Most UTIs are caused by germs called bacteria. They may be in or near your genitals. These germs grow and cause swelling in your urinary tract. What increases the risk? You're more likely to get a UTI if: You're a female. The urethra is shorter in females than in males. You have a soft tube called a catheter that drains your pee. You can't control when you pee or poop. You have trouble peeing because of: A kidney stone. A urinary blockage. A nerve condition that affects your bladder. Not getting enough to drink. You're sexually active. You use a birth control inside your vagina, like spermicide. You're pregnant. You have low levels of the hormone estrogen in your body. You're an older adult. You're also more likely to get a UTI if you have other health problems. These may include: Diabetes. A weak immune system. Your immune system is your body's defense system. Sickle cell disease. Injury of the spine. What are the signs or symptoms? Symptoms may include: Needing to pee right away. Peeing small amounts often. Pain or burning when you pee. Blood in  your pee. Pee that smells bad or odd. Pain in your belly or lower back. You may also: Feel confused. This may be the first symptom in older adults. Vomit. Not feel hungry. Feel tired or easily annoyed. Have a fever or chills. How is this diagnosed? A UTI is diagnosed based on your medical history and an exam. You may also have other tests. These may include: Pee tests. Blood tests. Tests for sexually transmitted infections (STIs). If you've had more than one UTI, you may need to have imaging studies done to find out why you keep getting them. How is this treated? A UTI can be treated by: Taking antibiotics or other medicines. Drinking enough fluid to keep your pee pale yellow. In rare cases, a UTI can cause  a very bad condition called sepsis. Sepsis may be treated in the hospital. Follow these instructions at home: Medicines Take your medicines only as told by your health care provider. If you were given antibiotics, take them as told by your provider. Do not stop taking them even if you start to feel better. General instructions Make sure you: Pee often and fully. Do not hold your pee for a long time. Wipe from front to back after you pee or poop. Use each tissue only once when you wipe. Pee after you have sex. Do not douche or use sprays or powders in your genital area. Contact a health care provider if: Your symptoms don't get better after 1-2 days of taking antibiotics. Your symptoms go away and then come back. You have a fever or chills. You vomit or feel like you may vomit. Get help right away if: You have very bad pain in your back or lower belly. You faint. This information is not intended to replace advice given to you by your health care provider. Make sure you discuss any questions you have with your health care provider. Document Revised: 01/27/2023 Document Reviewed: 09/24/2022 Elsevier Patient Education  2024 [employer]  Medroxyprogesterone Injection (Contraception) What is this medication? MEDROXYPROGESTERONE (me DROX ee proe JES te rone) prevents ovulation and pregnancy. It belongs to a group of medications called contraceptives. This medication is a progestin hormone. This medicine may be used for other purposes; ask your health care provider or pharmacist if you have questions. COMMON BRAND NAME(S): Depo-Provera, Depo-subQ Provera 104 What should I tell my care team before I take this medication? They need to know if you have any of these conditions: Asthma Blood clots Breast cancer or family history of breast cancer Depression Diabetes Eating disorder (anorexia nervosa) Frequently drink alcohol Heart attack High blood pressure HIV infection or AIDS Kidney  disease Liver disease Migraine headaches Osteoporosis, weak bones Seizures Stroke Tobacco use Vaginal bleeding An unusual or allergic reaction to medroxyprogesterone, other medications, foods, dyes, or preservatives Pregnant or trying to get pregnant Breast-feeding How should I use this medication? Depo-Provera CI contraceptive injection is given into a muscle. Depo-subQ Provera 104 injection is given under the skin. It is given in a hospital or clinic setting. The injection is usually given during the first 5 days after the start of a menstrual period or 6 weeks after delivery of a baby. A patient package insert for the product will be given with each prescription and refill. Be sure to read this information carefully each time. The sheet may change often. Talk to your care team about the use of this medication in children. Special care may be needed. These injections have been used in female  children who have started having menstrual periods. Overdosage: If you think you have taken too much of this medicine contact a poison control center or emergency room at once. NOTE: This medicine is only for you. Do not share this medicine with others. What if I miss a dose? Keep appointments for follow-up doses. You must get an injection once every 3 months. It is important not to miss your dose. Call your care team if you are unable to keep an appointment. What may interact with this medication? Antibiotics or medications for infections, especially rifampin and griseofulvin Antivirals for HIV or hepatitis Aprepitant Armodafinil Bexarotene Bosentan Medications for seizures, such as carbamazepine, felbamate, oxcarbazepine, phenytoin, phenobarbital, primidone, topiramate Mitotane Modafinil St. John's Wort This list may not describe all possible interactions. Give your health care provider a list of all the medicines, herbs, non-prescription drugs, or dietary supplements you use. Also tell them if  you smoke, drink alcohol, or use illegal drugs. Some items may interact with your medicine. What should I watch for while using this medication? This medication does not protect you against HIV infection (AIDS) or other sexually transmitted diseases. Use of this product may cause you to lose calcium from your bones. Loss of calcium may cause weak bones (osteoporosis). Only use this product for more than 2 years if other forms of birth control are not right for you. The longer you use this product for birth control the more likely you will be at risk for weak bones. Ask your care team how you can keep strong bones. You may have a change in bleeding pattern or irregular periods. Many females stop having periods while taking this medication. If you have received your injections on time, your chance of being pregnant is very low. If you think you may be pregnant, see your care team as soon as possible. Tell your care team if you want to get pregnant within the next year. The effect of this medication may last a long time after you get your last injection. What side effects may I notice from receiving this medication? Side effects that you should report to your care team as soon as possible: Allergic reactions--skin rash, itching, hives, swelling of the face, lips, tongue, or throat Blood clot--pain, swelling, or warmth in the leg, shortness of breath, chest pain Gallbladder problems--severe stomach pain, nausea, vomiting, fever Increase in blood pressure Liver injury--right upper belly pain, loss of appetite, nausea, light-colored stool, dark yellow or brown urine, yellowing skin or eyes, unusual weakness or fatigue New or worsening migraines or headaches Seizures Stroke--sudden numbness or weakness of the face, arm, or leg, trouble speaking, confusion, trouble walking, loss of balance or coordination, dizziness, severe headache, change in vision Unusual vaginal discharge, itching, or odor Worsening  mood, feelings of depression Side effects that usually do not require medical attention (report to your care team if they continue or are bothersome): Breast pain or tenderness Dark patches of the skin on the face or other sun-exposed areas Irregular menstrual cycles or spotting Nausea Weight gain This list may not describe all possible side effects. Call your doctor for medical advice about side effects. You may report side effects to FDA at 1-800-FDA-1088. Where should I keep my medication? This injection is only given by a care team. It will not be stored at home. NOTE: This sheet is a summary. It may not cover all possible information. If you have questions about this medicine, talk to your doctor, pharmacist, or health care provider.  2024 Elsevier/Gold Standard (2021-03-04 00:00:00)

## 2023-10-19 LAB — CERVICOVAGINAL ANCILLARY ONLY
Bacterial Vaginitis (gardnerella): POSITIVE — AB
Candida Glabrata: NEGATIVE
Candida Vaginitis: POSITIVE — AB
Chlamydia: NEGATIVE
Comment: NEGATIVE
Comment: NEGATIVE
Comment: NEGATIVE
Comment: NEGATIVE
Comment: NEGATIVE
Comment: NORMAL
Neisseria Gonorrhea: NEGATIVE
Trichomonas: NEGATIVE

## 2023-10-19 LAB — URINE CULTURE

## 2023-10-20 MED ORDER — FLUCONAZOLE 150 MG PO TABS: 150.0000 mg | ORAL_TABLET | Freq: Once | ORAL | 0 refills | Status: AC

## 2023-10-20 MED ORDER — METRONIDAZOLE 500 MG PO TABS: 500.0000 mg | ORAL_TABLET | Freq: Two times a day (BID) | ORAL | 0 refills | Status: DC

## 2023-10-20 NOTE — Addendum Note (Signed)
 Addended by: Ellina Sivertsen on: 10/20/2023 09:47 AM   Modules accepted: Orders

## 2024-01-09 ENCOUNTER — Ambulatory Visit (INDEPENDENT_AMBULATORY_CARE_PROVIDER_SITE_OTHER)

## 2024-01-09 ENCOUNTER — Other Ambulatory Visit (HOSPITAL_COMMUNITY)
Admission: RE | Admit: 2024-01-09 | Discharge: 2024-01-09 | Disposition: A | Discharge status: Home / Self Care | Source: Ambulatory Visit | Attending: Obstetrics and Gynecology | Admitting: Obstetrics and Gynecology

## 2024-01-09 VITALS — BP 162/104 | HR 85 | Ht 62.0 in | Wt 175.0 lb

## 2024-01-09 DIAGNOSIS — Z3042 Encounter for surveillance of injectable contraceptive: Secondary | ICD-10-CM | POA: Diagnosis not present

## 2024-01-09 DIAGNOSIS — B379 Candidiasis, unspecified: Secondary | ICD-10-CM | POA: Insufficient documentation

## 2024-01-09 MED ORDER — MEDROXYPROGESTERONE ACETATE 150 MG/ML IM SUSP
150.0000 mg | Freq: Once | INTRAMUSCULAR | Status: AC
  Administered 2024-01-09: 150 mg via INTRAMUSCULAR

## 2024-01-09 NOTE — Progress Notes (Signed)
    NURSE VISIT NOTE  Subjective:    Patient ID: Brittany Young, female    DOB: Nov 21, 1995, 28 y.o.   MRN: [id number]  HPI  Patient is a 28 y.o. [id number] female who presents for depo provera  injection and patient requested a vaginal swap for yeast infection. Patient states she has abnormal discharge for 3 days, denies itching, burning and abnormal odor. Patient consents to STD testing as well.   Objective:    BP (!) 162/104   Pulse 85   Ht 5' 2 (1.575 m)   Wt 175 lb (79.4 kg)   LMP 09/03/2023   BMI 32.01 kg/m   Last Annual: 01/18/23. Last pap: 01/18/23. Last Depo-Provera : 10/17/23. Side Effects if any: none. Serum HCG indicated? No . Depo-Provera  150 mg IM given by: Mathis Getting, CMA. Site: Left [place]  Lab Review  No results found for any visits on 01/09/24.  Assessment:   1. Encounter for surveillance of injectable contraceptive   2. Yeast infection      Plan:   Next appointment due between 03/26/24 and 04/09/24.    Mathis LITTIE Getting, CMA

## 2024-01-11 DIAGNOSIS — B9689 Other specified bacterial agents as the cause of diseases classified elsewhere: Secondary | ICD-10-CM

## 2024-01-11 LAB — CERVICOVAGINAL ANCILLARY ONLY
Bacterial Vaginitis (gardnerella): POSITIVE — AB
Candida Glabrata: NEGATIVE
Candida Vaginitis: NEGATIVE
Chlamydia: NEGATIVE
Comment: NEGATIVE
Comment: NEGATIVE
Comment: NEGATIVE
Comment: NEGATIVE
Comment: NEGATIVE
Comment: NORMAL
Neisseria Gonorrhea: NEGATIVE
Trichomonas: NEGATIVE

## 2024-01-11 MED ORDER — METRONIDAZOLE 500 MG PO TABS: 500.0000 mg | ORAL_TABLET | Freq: Two times a day (BID) | ORAL | 0 refills | Status: DC

## 2024-03-26 ENCOUNTER — Ambulatory Visit

## 2024-03-26 NOTE — Patient Instructions (Incomplete)

## 2024-04-02 NOTE — Progress Notes (Unsigned)
 PCP:  [facility]   No chief complaint on file.    HPI:      [name] is a 28 y.o. [id number] whose LMP was No LMP recorded. Patient has had an injection., presents today for her annual examination.  Her menses are regular every 28-30 days, lasting 7 days, mod to heavy flow.  Dysmenorrhea moderate, no meds taken because she doesn't like to swallow pills. She does not have intermenstrual bleeding. Hx of IDA in past, would like CBC today. Taking Fe supp  Sex activity: single partner, contraception - none. Would like to restart BC. Did depo and Mirena in past; would like depo again. No hx of HTN, DVTs, migraines with aura.  Last Pap: 01/18/23 Results were: no abnormalities  Hx of STDs: GC and trich in past with neg TOC; would like full STD testing today  There is no FH of breast cancer. There is no FH of ovarian cancer. The patient does not do self-breast exams.  Tobacco use: The patient denies current or previous tobacco use. Alcohol use: none No drug use.  Exercise: light, walking  She does get adequate calcium but not Vitamin D in her diet.  Patient Active Problem List   Diagnosis Date Noted   Vaginal burning    Bacterial vaginitis    Pregnancy 01/26/2021   Fibrocystic breast changes, right 12/06/2019   History of pregnancy induced hypertension 11/03/2016    Past Surgical History:  Procedure Laterality Date   APPENDECTOMY     CESAREAN SECTION N/A 09/2011   CESAREAN SECTION N/A 09/27/2018   Procedure: CESAREAN SECTION repeat;  Surgeon: Connell Davies, MD;  Location: [facility] ORS;  Service: Obstetrics;  Laterality: N/A;   LAPAROSCOPIC APPENDECTOMY N/A 07/23/2015   Procedure: APPENDECTOMY LAPAROSCOPIC;  Surgeon: Charlie FORBES Fell, MD;  Location: [facility] ORS;  Service: General;  Laterality: N/A;   WISDOM TOOTH EXTRACTION      Family History  Problem Relation Age of Onset   GER disease Mother    Hypertension Mother    Cancer Maternal Grandfather    Diabetes  Maternal Grandfather    Hypertension Maternal Grandfather    Breast cancer Neg Hx    Ovarian cancer Neg Hx    Colon cancer Neg Hx     Social History   Socioeconomic History   Marital status: Single    Spouse name: Brittany Young   Number of children: Not on file   Years of education: Not on file   Highest education level: Not on file  Occupational History   Not on file  Tobacco Use   Smoking status: Former    Current packs/day: 0.25    Types: Cigars, Cigarettes   Smokeless tobacco: Never   Tobacco comments:    smoke black and milds  Vaping Use   Vaping status: Never Used  Substance and Sexual Activity   Alcohol use: No    Alcohol/week: 0.0 standard drinks of alcohol   Drug use: Not Currently    Types: Marijuana    Comment: last used- 02/06/2018   Sexual activity: Yes    Birth control/protection: Injection  Other Topics Concern   Not on file  Social History Narrative   Not on file   Social Drivers of Health   Financial Resource Strain: Not on file  Food Insecurity: Not on file  Transportation Needs: Not on file  Physical Activity: Not on file  Stress: Not on file  Social Connections: Not on file  Intimate Partner Violence:  Not on file     Current Outpatient Medications:    amLODipine  (NORVASC ) 5 MG tablet, Take 1 tablet (5 mg total) by mouth daily., Disp: 30 tablet, Rfl: 2   Ferrous Sulfate  (IRON PO), Take by mouth., Disp: , Rfl:    metroNIDAZOLE  (FLAGYL ) 500 MG tablet, Take 1 tablet (500 mg total) by mouth 2 (two) times daily., Disp: 14 tablet, Rfl: 0   metroNIDAZOLE  (FLAGYL ) 500 MG tablet, Take 1 tablet (500 mg total) by mouth 2 (two) times daily., Disp: 14 tablet, Rfl: 0  Current Facility-Administered Medications:    medroxyPROGESTERone  Acetate SUSY 150 mg, 150 mg, Intramuscular, Q90 days,      ROS:  Review of Systems  Constitutional:  Negative for fatigue, fever and unexpected weight change.  Respiratory:  Negative for cough, shortness of breath and  wheezing.   Cardiovascular:  Negative for chest pain, palpitations and leg swelling.  Gastrointestinal:  Negative for blood in stool, constipation, diarrhea, nausea and vomiting.  Endocrine: Negative for cold intolerance, heat intolerance and polyuria.  Genitourinary:  Negative for dyspareunia, dysuria, flank pain, frequency, genital sores, hematuria, menstrual problem, pelvic pain, urgency, vaginal bleeding, vaginal discharge and vaginal pain.  Musculoskeletal:  Positive for arthralgias. Negative for back pain, joint swelling and myalgias.  Skin:  Negative for rash.  Neurological:  Negative for dizziness, syncope, light-headedness, numbness and headaches.  Hematological:  Negative for adenopathy.  Psychiatric/Behavioral:  Negative for agitation, confusion, sleep disturbance and suicidal ideas. The patient is not nervous/anxious.    BREAST: No symptoms   Objective: There were no vitals taken for this visit.   Physical Exam Constitutional:      Appearance: She is well-developed.  Genitourinary:     Vulva normal.     Right Labia: No rash, tenderness or lesions.    Left Labia: No tenderness, lesions or rash.    No vaginal discharge, erythema or tenderness.      Right Adnexa: not tender and no mass present.    Left Adnexa: not tender and no mass present.    No cervical friability or polyp.     Uterus is not enlarged or tender.  Breasts:    Right: No mass, nipple discharge, skin change or tenderness.     Left: No mass, nipple discharge, skin change or tenderness.  Neck:     Thyroid: No thyromegaly.  Cardiovascular:     Rate and Rhythm: Normal rate and regular rhythm.     Heart sounds: Normal heart sounds. No murmur heard. Pulmonary:     Effort: Pulmonary effort is normal.     Breath sounds: Normal breath sounds.  Abdominal:     Palpations: Abdomen is soft.     Tenderness: There is no abdominal tenderness. There is no guarding or rebound.  Musculoskeletal:        General:  Normal range of motion.     Cervical back: Normal range of motion.  Lymphadenopathy:     Cervical: No cervical adenopathy.  Neurological:     General: No focal deficit present.     Mental Status: She is alert and oriented to person, place, and time.     Cranial Nerves: No cranial nerve deficit.  Skin:    General: Skin is warm and dry.  Psychiatric:        Mood and Affect: Mood normal.        Behavior: Behavior normal.        Thought Content: Thought content normal.  Judgment: Judgment normal.  Vitals reviewed.    Assessment/Plan: Encounter for annual routine gynecological examination  Cervical cancer screening - Plan: Cytology - PAP  Screening for STD (sexually transmitted disease) - Plan: Hepatitis C antibody, RPR Qual, HIV Antibody (routine testing w rflx), Cytology - PAP  Encounter for initial prescription of injectable contraceptive - Plan: medroxyPROGESTERone  Acetate SUSY 150 mg; BC options discussed, pt would like to restart depo. Call with menses for nurse visit. Cont ca/add Vit D.   History of anemia - Plan: CBC with Differential/Platelet; check labs. Will f/u with results.   No orders of the defined types were placed in this encounter.               F/U  No follow-ups on file.  Byron Tipping B. Jessie Cowher, PA-C 04/02/2024 3:33 PM

## 2024-04-03 ENCOUNTER — Ambulatory Visit (INDEPENDENT_AMBULATORY_CARE_PROVIDER_SITE_OTHER): Admitting: Obstetrics and Gynecology

## 2024-04-03 ENCOUNTER — Other Ambulatory Visit: Payer: Self-pay | Admitting: Obstetrics and Gynecology

## 2024-04-03 ENCOUNTER — Encounter: Payer: Self-pay | Admitting: Obstetrics and Gynecology

## 2024-04-03 ENCOUNTER — Other Ambulatory Visit (HOSPITAL_COMMUNITY)
Admission: RE | Admit: 2024-04-03 | Discharge: 2024-04-03 | Disposition: A | Discharge status: Home / Self Care | Source: Ambulatory Visit | Attending: Obstetrics and Gynecology | Admitting: Obstetrics and Gynecology

## 2024-04-03 VITALS — BP 138/89 | HR 89 | Ht 62.0 in | Wt 180.0 lb

## 2024-04-03 DIAGNOSIS — Z113 Encounter for screening for infections with a predominantly sexual mode of transmission: Secondary | ICD-10-CM | POA: Diagnosis present

## 2024-04-03 DIAGNOSIS — I10 Essential (primary) hypertension: Secondary | ICD-10-CM | POA: Diagnosis not present

## 2024-04-03 DIAGNOSIS — Z3042 Encounter for surveillance of injectable contraceptive: Secondary | ICD-10-CM | POA: Diagnosis not present

## 2024-04-03 DIAGNOSIS — Z01419 Encounter for gynecological examination (general) (routine) without abnormal findings: Secondary | ICD-10-CM

## 2024-04-03 MED ORDER — VALSARTAN 40 MG PO TABS: 40.0000 mg | ORAL_TABLET | Freq: Every day | ORAL | 0 refills | Status: DC

## 2024-04-03 MED ORDER — MEDROXYPROGESTERONE ACETATE 150 MG/ML IM SUSY
150.0000 mg | PREFILLED_SYRINGE | Freq: Once | INTRAMUSCULAR | Status: AC
  Administered 2024-04-03: 150 mg via INTRAMUSCULAR

## 2024-04-03 MED ORDER — MEDROXYPROGESTERONE ACETATE 150 MG/ML IM SUSP: 150.0000 mg | INTRAMUSCULAR | Status: AC

## 2024-04-03 NOTE — Patient Instructions (Signed)
 I value your feedback and you entrusting [place] with your care. If you get a [place] patient survey, I would appreciate you taking the time to let us know about your experience today. Thank you! ? ? ?

## 2024-04-04 ENCOUNTER — Telehealth: Payer: Self-pay

## 2024-04-04 LAB — COMPREHENSIVE METABOLIC PANEL WITH GFR
ALT: 13 IU/L (ref 0–32)
AST: 15 IU/L (ref 0–40)
Albumin: 4.3 g/dL (ref 4.0–5.0)
Alkaline Phosphatase: 79 IU/L (ref 41–116)
BUN/Creatinine Ratio: 13 (ref 9–23)
BUN: 10 mg/dL (ref 6–20)
Bilirubin Total: 0.4 mg/dL (ref 0.0–1.2)
CO2: 21 mmol/L (ref 20–29)
Calcium: 9.4 mg/dL (ref 8.7–10.2)
Chloride: 106 mmol/L (ref 96–106)
Creatinine, Ser: 0.79 mg/dL (ref 0.57–1.00)
Globulin, Total: 3.2 g/dL (ref 1.5–4.5)
Glucose: 99 mg/dL (ref 70–99)
Potassium: 3.8 mmol/L (ref 3.5–5.2)
Sodium: 141 mmol/L (ref 134–144)
Total Protein: 7.5 g/dL (ref 6.0–8.5)
eGFR: 104 mL/min/1.73 (ref >59)

## 2024-04-04 LAB — RPR: RPR Ser Ql: NONREACTIVE

## 2024-04-04 LAB — HEPATITIS C ANTIBODY: Hep C Virus Ab: NONREACTIVE

## 2024-04-04 LAB — HIV ANTIBODY (ROUTINE TESTING W REFLEX): HIV Screen 4th Generation wRfx: NONREACTIVE

## 2024-04-04 NOTE — Telephone Encounter (Signed)
 PT Calling for advice on back pain she stated it was in the middle part of back lydia D CNM was beside me when time of call and I asked her for advice for pt as she saw her yesterday and did not mention this pain, she stated Over-the-counter pain relief like acetaminophen  and ibuprofen  was not helping I advised her per lydia, how to manage the back pain.    Position during surgery: You might have been in a position during the surgery that put strain on your back muscles.  Epidural or spinal anesthesia: Sometimes the needle for anesthesia can cause temporary back soreness.  Muscle strain: Carrying the baby during pregnancy and then the surgery can cause muscle tension and soreness.  Posture changes: After a C-section, you might avoid certain movements or have altered posture that stresses your back.  Healing process: The body is healing and adjusting, which can cause discomfort.  Here are a few things that might help:  Gentle stretching and walking as approved by your doctor  Using a warm compress on sore areas (but not directly on the incision)  Maintaining good posture  Important: If your pain is severe, worsening, associated with fever, numbness, weakness, or drainage from the incision, please contact your healthcare provider right away.

## 2024-04-05 LAB — CERVICOVAGINAL ANCILLARY ONLY
Chlamydia: NEGATIVE
Comment: NEGATIVE
Comment: NEGATIVE
Comment: NORMAL
Neisseria Gonorrhea: NEGATIVE
Trichomonas: NEGATIVE

## 2024-04-26 NOTE — Progress Notes (Deleted)
 [facility]   No chief complaint on file.   HPI:      [name] is a 28 y.o. [id number] whose LMP was No LMP recorded. Patient has had an injection., presents today for ***  04/03/24 Essential hypertension - Plan: valsartan (DIOVAN) 40 MG tablet, Comprehensive metabolic panel with GFR; discussed importance of tx even if no sx. Rx eRxd, check labs. Pt to RTO in 4 wks for f/u; establish care with PCP in meantime.   Patient Active Problem List   Diagnosis Date Noted   Vaginal burning    Bacterial vaginitis    Pregnancy 01/26/2021   Fibrocystic breast changes, right 12/06/2019   History of pregnancy induced hypertension 11/03/2016    Past Surgical History:  Procedure Laterality Date   APPENDECTOMY     CESAREAN SECTION N/A 09/2011   CESAREAN SECTION N/A 09/27/2018   Procedure: CESAREAN SECTION repeat;  Surgeon: Connell Davies, MD;  Location: [facility];  Service: Obstetrics;  Laterality: N/A;   LAPAROSCOPIC APPENDECTOMY N/A 07/23/2015   Procedure: APPENDECTOMY LAPAROSCOPIC;  Surgeon: Charlie FORBES Fell, MD;  Location: [facility];  Service: General;  Laterality: N/A;   WISDOM TOOTH EXTRACTION      Family History  Problem Relation Age of Onset   GER disease Mother    Hypertension Mother    Cancer Maternal Grandfather        unsure of type   Diabetes Maternal Grandfather    Hypertension Maternal Grandfather    Breast cancer Neg Hx    Ovarian cancer Neg Hx    Colon cancer Neg Hx     Social History   Socioeconomic History   Marital status: Single    Spouse name: Candia   Number of children: Not on file   Years of education: Not on file   Highest education level: Not on file  Occupational History   Not on file  Tobacco Use   Smoking status: Former    Current packs/day: 0.25    Types: Cigars, Cigarettes   Smokeless tobacco: Never   Tobacco comments:    smoke black and milds  Vaping Use   Vaping status: Never Used  Substance and Sexual Activity    Alcohol use: Yes   Drug use: Not Currently    Types: Marijuana    Comment: last used- 02/06/2018   Sexual activity: Yes    Birth control/protection: Injection  Other Topics Concern   Not on file  Social History Narrative   Not on file   Social Drivers of Health   Financial Resource Strain: Not on file  Food Insecurity: Not on file  Transportation Needs: Not on file  Physical Activity: Not on file  Stress: Not on file  Social Connections: Not on file  Intimate Partner Violence: Not on file    Outpatient Medications Prior to Visit  Medication Sig Dispense Refill   valsartan (DIOVAN) 40 MG tablet Take 1 tablet (40 mg total) by mouth daily. 30 tablet 0   Facility-Administered Medications Prior to Visit  Medication Dose Route Frequency Provider Last Rate Last Admin   medroxyPROGESTERone  (DEPO-PROVERA ) injection 150 mg  150 mg Intramuscular Q90 days           ROS:  Review of Systems BREAST: No symptoms   OBJECTIVE:   Vitals:  There were no vitals taken for this visit.  Physical Exam  Results: No results found for this or any previous visit (from the past 24 hours).   Assessment/Plan:  No diagnosis found.    No orders of the defined types were placed in this encounter.     No follow-ups on file.  Layloni Fahrner B. Audie Stayer, PA-C 04/26/2024 3:34 PM

## 2024-05-01 ENCOUNTER — Ambulatory Visit: Admitting: Obstetrics and Gynecology

## 2024-05-01 DIAGNOSIS — I10 Essential (primary) hypertension: Secondary | ICD-10-CM

## 2024-05-10 ENCOUNTER — Other Ambulatory Visit: Payer: Self-pay | Admitting: Obstetrics and Gynecology

## 2024-05-10 ENCOUNTER — Other Ambulatory Visit: Payer: Self-pay

## 2024-05-10 DIAGNOSIS — I10 Essential (primary) hypertension: Secondary | ICD-10-CM

## 2024-05-10 MED ORDER — VALSARTAN 40 MG PO TABS: 40.0000 mg | ORAL_TABLET | Freq: Every day | ORAL | 0 refills | Status: AC

## 2024-05-10 NOTE — Progress Notes (Signed)
 Pt requesting refill of blood pressure medication until she can be seen by her PCP at the end of this month.  Refill authorized.

## 2024-05-15 ENCOUNTER — Ambulatory Visit (INDEPENDENT_AMBULATORY_CARE_PROVIDER_SITE_OTHER): Admitting: Obstetrics and Gynecology

## 2024-05-15 ENCOUNTER — Encounter: Payer: Self-pay | Admitting: Obstetrics and Gynecology

## 2024-05-15 VITALS — BP 159/111 | HR 88 | Ht 62.0 in | Wt 186.0 lb

## 2024-05-15 DIAGNOSIS — I10 Essential (primary) hypertension: Secondary | ICD-10-CM | POA: Diagnosis not present

## 2024-05-15 DIAGNOSIS — N643 Galactorrhea not associated with childbirth: Secondary | ICD-10-CM

## 2024-05-15 NOTE — Patient Instructions (Signed)
 I value your feedback and you entrusting [place] with your care. If you get a [place] patient survey, I would appreciate you taking the time to let us know about your experience today. Thank you! ? ? ?

## 2024-05-15 NOTE — Progress Notes (Signed)
 [facility]   Chief Complaint  Patient presents with   Breast Exam    White colored fluid coming out of RB and clear fluid out of LB x 2 days    HPI:      [name] is a 28 y.o. [id number] whose LMP was No LMP recorded. Patient has had an injection., presents today for galactorrhea with squeezing of the nipples the past 2 days. Milky d/c from RT breast and clear d/c from LT breast. No masses, erythema, trauma. No med changes. No MJ use. On depo for several yrs.  Dx'd with HTN at 9/25 appt. Restarted valsartan, pt taking occas. Has PCP about in 2 wks. Didn't take med today. Having some HA and dizziness, no vision changes. FH HTN in her mom. Normal CMP 9/25   Patient Active Problem List   Diagnosis Date Noted   Vaginal burning    Bacterial vaginitis    Pregnancy 01/26/2021   Fibrocystic breast changes, right 12/06/2019   History of pregnancy induced hypertension 11/03/2016    Past Surgical History:  Procedure Laterality Date   APPENDECTOMY     CESAREAN SECTION N/A 09/2011   CESAREAN SECTION N/A 09/27/2018   Procedure: CESAREAN SECTION repeat;  Surgeon: Connell Davies, MD;  Location: [facility] OR[facility];  Service: Obstetrics;  Laterality: N/A;   LAPAROSCOPIC APPENDECTOMY N/A 07/23/2015   Procedure: APPENDECTOMY LAPAROSCOPIC;  Surgeon: Charlie FORBES Fell, MD;  Location: [facility] OR[facility];  Service: General;  Laterality: N/A;   WISDOM TOOTH EXTRACTION      Family History  Problem Relation Age of Onset   GER disease Mother    Hypertension Mother    Cancer Maternal Grandfather        unsure of type   Diabetes Maternal Grandfather    Hypertension Maternal Grandfather    Breast cancer Neg Hx    Ovarian cancer Neg Hx    Colon cancer Neg Hx     Social History   Socioeconomic History   Marital status: Single    Spouse name: Candia   Number of children: Not on file   Years of education: Not on file   Highest education level: Not on file  Occupational History   Not on  file  Tobacco Use   Smoking status: Former    Current packs/day: 0.25    Types: Cigars, Cigarettes   Smokeless tobacco: Never   Tobacco comments:    smoke black and milds  Vaping Use   Vaping status: Never Used  Substance and Sexual Activity   Alcohol use: Yes   Drug use: Not Currently    Types: Marijuana    Comment: last used- 02/06/2018   Sexual activity: Yes    Birth control/protection: Injection  Other Topics Concern   Not on file  Social History Narrative   Not on file   Social Drivers of Health   Financial Resource Strain: Not on file  Food Insecurity: Not on file  Transportation Needs: Not on file  Physical Activity: Not on file  Stress: Not on file  Social Connections: Not on file  Intimate Partner Violence: Not on file    Outpatient Medications Prior to Visit  Medication Sig Dispense Refill   valsartan (DIOVAN) 40 MG tablet Take 1 tablet (40 mg total) by mouth daily. 30 tablet 0   Facility-Administered Medications Prior to Visit  Medication Dose Route Frequency Provider Last Rate Last Admin   medroxyPROGESTERone  (DEPO-PROVERA ) injection 150 mg  150 mg Intramuscular Q90  days           ROS:  Review of Systems  Constitutional:  Negative for fever.  Gastrointestinal:  Negative for blood in stool, constipation, diarrhea, nausea and vomiting.  Genitourinary:  Negative for dyspareunia, dysuria, flank pain, frequency, hematuria, urgency, vaginal bleeding, vaginal discharge and vaginal pain.  Musculoskeletal:  Negative for back pain.  Skin:  Negative for rash.  Neurological:  Positive for dizziness and headaches.   BREAST: nipple d/c   OBJECTIVE:   Vitals:  BP (!) 159/111   Pulse 88   Ht 5' 2 (1.575 m)   Wt 186 lb (84.4 kg)   BMI 34.02 kg/m   Physical Exam Vitals reviewed.  Pulmonary:     Effort: Pulmonary effort is normal.  Chest:  Breasts:    Breasts are symmetrical.     Right: No inverted nipple, mass, nipple discharge, skin change or  tenderness.     Left: No inverted nipple, mass, nipple discharge, skin change or tenderness.     Comments: NO NIPPLE D/C ON EXAM; NO MASSES Musculoskeletal:        General: Normal range of motion.     Cervical back: Normal range of motion.  Skin:    General: Skin is warm and dry.  Neurological:     General: No focal deficit present.     Mental Status: She is alert and oriented to person, place, and time.     Cranial Nerves: No cranial nerve deficit.  Psychiatric:        Mood and Affect: Mood normal.        Behavior: Behavior normal.        Thought Content: Thought content normal.        Judgment: Judgment normal.     Assessment/Plan: Galactorrhea - Plan: TSH, Prolactin; for a couple days, check labs. If neg, reassurance. Could be rare side effect of depo. F/u prn.   Essential hypertension--encouraged pt to start valsartan every day and f/u with PCP.     Return if symptoms worsen or fail to improve.  Evangelina Delancey B. Bradie Sangiovanni, PA-C 05/15/2024 4:34 PM

## 2024-05-16 ENCOUNTER — Ambulatory Visit: Admitting: Nurse Practitioner

## 2024-05-16 LAB — PROLACTIN: Prolactin: 9.7 ng/mL (ref 4.8–33.4)

## 2024-05-16 LAB — TSH: TSH: 0.604 u[IU]/mL (ref 0.450–4.500)

## 2024-05-18 ENCOUNTER — Ambulatory Visit: Payer: Self-pay | Admitting: Obstetrics and Gynecology

## 2024-05-30 ENCOUNTER — Encounter: Payer: Self-pay | Admitting: Nurse Practitioner

## 2024-05-30 ENCOUNTER — Ambulatory Visit: Admitting: Nurse Practitioner

## 2024-05-30 ENCOUNTER — Other Ambulatory Visit: Payer: Self-pay | Admitting: Nurse Practitioner

## 2024-05-30 VITALS — BP 152/110 | HR 89 | Temp 98.3°F | Ht 62.0 in | Wt 188.0 lb

## 2024-05-30 DIAGNOSIS — I10 Essential (primary) hypertension: Secondary | ICD-10-CM | POA: Insufficient documentation

## 2024-05-30 DIAGNOSIS — Z7689 Persons encountering health services in other specified circumstances: Secondary | ICD-10-CM

## 2024-05-30 MED ORDER — AMLODIPINE BESYLATE 5 MG PO TABS: 5.0000 mg | ORAL_TABLET | Freq: Every day | ORAL | 0 refills | Status: DC

## 2024-05-30 NOTE — Progress Notes (Addendum)
 BP (!) 152/110   Pulse 89   Temp 98.3 F (36.8 C)   Ht 5' 2 (1.575 m)   Wt 188 lb (85.3 kg)   SpO2 98%   BMI 34.39 kg/m    Subjective:    Patient ID: Brittany Young, female    DOB: 1995-08-23, 28 y.o.   MRN: [id number]  HPI: Brittany Young is a 28 y.o. female  Chief Complaint  Patient presents with   Establish Care    Pt c/o hypertension and states concerns about breast leaking fluid when she is not pregnant.    Discussed the use of AI scribe software for clinical note transcription with the patient, who gave verbal consent to proceed.  History of Present Illness Brittany Young is a 29 year old female who presents with breast leakage.  Breast discharge - Breast leakage for the past two days - Milky discharge from the right breast - Clear discharge from the left breast - On Depo-Provera  for several years - Prolactin and TSH levels normal - Comprehensive metabolic panel (CMP) from September normal  Hypertension and associated symptoms - History of hypertension - Blood pressure 159/111 on November 11 - Blood pressure 136/108 today - Recently started valsartan , takes it occasionally - Previously tried amlodipine , no refills - Daily headaches - No blurred vision - Mother with history of high blood pressure  Mood and stress symptoms - Completed depression and anxiety screening - No past history of depression or anxiety - Feels stressed and overthinks - No severe symptoms or thoughts         05/30/2024    1:56 PM  Depression screen PHQ 2/9  Decreased Interest 0  Down, Depressed, Hopeless 1  PHQ - 2 Score 1  Altered sleeping 2  Tired, decreased energy 3  Change in appetite 1  Feeling bad or failure about yourself  0  Trouble concentrating 0  Moving slowly or fidgety/restless 0  Suicidal thoughts 0  PHQ-9 Score 7  Difficult doing work/chores Not difficult at all       05/30/2024    1:56 PM  GAD 7 : Generalized Anxiety  Score  Nervous, Anxious, on Edge 1  Control/stop worrying 2  Worry too much - different things 2  Trouble relaxing 2  Restless 0  Easily annoyed or irritable 2  Afraid - awful might happen 0  Total GAD 7 Score 9  Anxiety Difficulty Not difficult at all     Relevant past medical, surgical, family and social history reviewed and updated as indicated. Interim medical history since our last visit reviewed. Allergies and medications reviewed and updated.  Review of Systems  Ten systems reviewed and is negative except as mentioned in HPI      Objective:      BP (!) 152/110   Pulse 89   Temp 98.3 F (36.8 C)   Ht 5' 2 (1.575 m)   Wt 188 lb (85.3 kg)   SpO2 98%   BMI 34.39 kg/m    Wt Readings from Last 3 Encounters:  05/30/24 188 lb (85.3 kg)  05/15/24 186 lb (84.4 kg)  04/03/24 180 lb (81.6 kg)    Physical Exam VITALS: BP- 136/108 GENERAL: Alert, cooperative, well developed, no acute distress HEENT: Normocephalic, normal oropharynx, moist mucous membranes CHEST: Clear to auscultation bilaterally, no wheezes, rhonchi, or crackles CARDIOVASCULAR: Normal heart rate and rhythm, S1 and S2 normal without murmurs ABDOMEN: Soft, non-tender, non-distended, without organomegaly, normal bowel sounds EXTREMITIES: No  cyanosis or edema NEUROLOGICAL: Cranial nerves grossly intact, moves all extremities without gross motor or sensory deficit  Results for orders placed or performed in visit on 05/15/24  TSH   Collection Time: 05/15/24  3:28 PM  Result Value Ref Range   TSH 0.604 0.450 - 4.500 uIU/mL  Prolactin   Collection Time: 05/15/24  3:28 PM  Result Value Ref Range   Prolactin 9.7 4.8 - 33.4 ng/mL          Assessment & Plan:   Problem List Items Addressed This Visit       Cardiovascular and Mediastinum   Primary hypertension - Primary   Relevant Medications   amLODipine  (NORVASC ) 5 MG tablet   Other Visit Diagnoses       Encounter to establish care          Mild episode of recurrent major depressive disorder         Galactorrhea            Assessment and Plan Assessment & Plan Essential hypertension Hypertension post-pregnancy with current blood pressure of 136/108 mmHg. Previously 159/111 mmHg. Family history of hypertension. Currently on valsartan  for one month with no side effects. Amlodipine  was previously prescribed but not refilled. No home blood pressure monitoring. Reports daily headaches and blurred vision. No immediate plans for pregnancy, but open dialogue about future plans is necessary due to medication safety concerns. - Prescribed amlodipine  30 tablets to increase dose. - Scheduled blood pressure check next week. - Ensure adequate supply of valsartan .  Galactorrhea Milky discharge from right breast and clear discharge from left breast. Prolactin and TSH levels are normal. GYN evaluation indicated no concern, likely related to Depo-Provera  use. - Continue to monitor symptoms and report any changes. - patient wants to continue Depo-Provera  regimen.  Depression Positive depression screening with PHQ-9 score of 4 or higher. No prior history of depression or anxiety. Reports stress and overthinking but denies severe symptoms or need for medication. Discussed that medication helps cope better but does not fix situations. - Continue to monitor mood and stress levels. - Encouraged open communication about mental health.        Follow up plan: Return in about 4 weeks (around 06/27/2024), or bp check,  f/u 4 months, for nurse visit.

## 2024-06-05 NOTE — Telephone Encounter (Signed)
 Requested Prescriptions  Refused Prescriptions Disp Refills   amLODipine  (NORVASC ) 5 MG tablet [Pharmacy Med Name: AMLODIPINE  BESYLATE 5MG  TABLETS] 90 tablet     Sig: TAKE 1 TABLET(5 MG) BY MOUTH DAILY     Cardiovascular: Calcium Channel Blockers 2 Failed - 06/05/2024 10:47 AM      Failed - Last BP in normal range    BP Readings from Last 1 Encounters:  05/30/24 (!) 152/110         Passed - Last Heart Rate in normal range    Pulse Readings from Last 1 Encounters:  05/30/24 89         Passed - Valid encounter within last 6 months    Recent Outpatient Visits           6 days ago Primary hypertension   [facility] Gareth Mliss [place], [place]

## 2024-06-08 ENCOUNTER — Ambulatory Visit

## 2024-06-20 ENCOUNTER — Emergency Department

## 2024-06-20 ENCOUNTER — Other Ambulatory Visit: Payer: Self-pay

## 2024-06-20 ENCOUNTER — Emergency Department
Admission: EM | Admit: 2024-06-20 | Discharge: 2024-06-20 | Disposition: A | Discharge status: Home / Self Care | Attending: Emergency Medicine | Admitting: Emergency Medicine

## 2024-06-20 DIAGNOSIS — S40012A Contusion of left shoulder, initial encounter: Secondary | ICD-10-CM | POA: Diagnosis not present

## 2024-06-20 DIAGNOSIS — S4992XA Unspecified injury of left shoulder and upper arm, initial encounter: Secondary | ICD-10-CM | POA: Diagnosis present

## 2024-06-20 DIAGNOSIS — Y92410 Unspecified street and highway as the place of occurrence of the external cause: Secondary | ICD-10-CM | POA: Diagnosis not present

## 2024-06-20 NOTE — Discharge Instructions (Signed)
 Your CT scans and x-ray was normal.  Please follow-up with your outpatient provider.  Please return for any new, worsening, or changing symptoms or other concerns.  It was a pleasure caring for you today.
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
## 2024-07-04 ENCOUNTER — Other Ambulatory Visit: Payer: Self-pay | Admitting: Nurse Practitioner

## 2024-07-04 DIAGNOSIS — I10 Essential (primary) hypertension: Secondary | ICD-10-CM

## 2024-07-05 NOTE — Telephone Encounter (Signed)
 Requested Prescriptions  Pending Prescriptions Disp Refills   amLODipine  (NORVASC ) 5 MG tablet [Pharmacy Med Name: AMLODIPINE  BESYLATE 5MG  TABLETS] 90 tablet 1    Sig: TAKE 1 TABLET(5 MG) BY MOUTH DAILY     Cardiovascular: Calcium Channel Blockers 2 Failed - 07/05/2024 12:53 PM      Failed - Last BP in normal range    BP Readings from Last 1 Encounters:  06/20/24 (!) 170/100         Passed - Last Heart Rate in normal range    Pulse Readings from Last 1 Encounters:  06/20/24 80         Passed - Valid encounter within last 6 months    Recent Outpatient Visits           1 month ago Primary hypertension   [facility] Gareth Mliss [place], [place]

## 2024-08-08 ENCOUNTER — Ambulatory Visit: Admitting: Obstetrics

## 2024-08-08 NOTE — Progress Notes (Unsigned)
" ° ° °  GYNECOLOGY PROGRESS NOTE  Subjective:  PCP: Gareth Mliss FAL[place], FNP  Patient ID: Brittany Young, female    DOB: 1996/05/04, 29 y.o.   MRN: [id number]  HPI  Patient is a 29 y.o. [id number] female who presents for birth control consult.  Has been on Depo since 2024 with rare BTB and no periods.  History of HTN and menorrhagia with IDA.  {Common ambulatory SmartLinks:19316}  Review of Systems {ros; complete:30496}   Objective:   There were no vitals taken for this visit. There is no height or weight on file to calculate BMI.  General appearance: {general exam:16600} Abdomen: {abdominal exam:16834} Pelvic: {pelvic exam:16852::cervix normal in appearance,external genitalia normal,no adnexal masses or tenderness,no cervical motion tenderness,rectovaginal septum normal,uterus normal size, shape, and consistency,vagina normal without discharge} Extremities: {extremity exam:5109} Neurologic: {neuro exam:17854}   Assessment/Plan:   No diagnosis found.   There are no diagnoses linked to this encounter.     Estil Mangle, DO [facility]  "

## 2024-09-28 ENCOUNTER — Ambulatory Visit: Admitting: Nurse Practitioner
